# Patient Record
Sex: Female | Born: 1950 | Race: Black or African American | Hispanic: No | Marital: Married | Smoking: Never smoker
Health system: Southern US, Community
[De-identification: ages and names within clinical notes are randomized; demographics above are authoritative.]

## PROBLEM LIST (undated history)

## (undated) DIAGNOSIS — M1990 Unspecified osteoarthritis, unspecified site: Secondary | ICD-10-CM

## (undated) DIAGNOSIS — F419 Anxiety disorder, unspecified: Secondary | ICD-10-CM

## (undated) DIAGNOSIS — N390 Urinary tract infection, site not specified: Secondary | ICD-10-CM

## (undated) DIAGNOSIS — J069 Acute upper respiratory infection, unspecified: Secondary | ICD-10-CM

## (undated) DIAGNOSIS — I10 Essential (primary) hypertension: Secondary | ICD-10-CM

## (undated) DIAGNOSIS — E669 Obesity, unspecified: Secondary | ICD-10-CM

## (undated) DIAGNOSIS — D649 Anemia, unspecified: Secondary | ICD-10-CM

## (undated) DIAGNOSIS — R51 Headache: Secondary | ICD-10-CM

## (undated) HISTORY — DX: Anxiety disorder, unspecified: F41.9

## (undated) HISTORY — DX: Essential (primary) hypertension: I10

## (undated) HISTORY — DX: Obesity, unspecified: E66.9

## (undated) HISTORY — DX: Urinary tract infection, site not specified: N39.0

## (undated) HISTORY — PX: COLONOSCOPY: SHX174

## (undated) HISTORY — DX: Anemia, unspecified: D64.9

## (undated) HISTORY — PX: OTHER SURGICAL HISTORY: SHX169

## (undated) HISTORY — DX: Headache: R51

## (undated) HISTORY — DX: Acute upper respiratory infection, unspecified: J06.9

## (undated) HISTORY — DX: Unspecified osteoarthritis, unspecified site: M19.90

---

## 1998-04-04 ENCOUNTER — Other Ambulatory Visit: Admission: RE | Admit: 1998-04-04 | Discharge: 1998-04-04 | Payer: Self-pay | Admitting: Obstetrics and Gynecology

## 1999-04-10 ENCOUNTER — Other Ambulatory Visit: Admission: RE | Admit: 1999-04-10 | Discharge: 1999-04-10 | Payer: Self-pay | Admitting: Obstetrics and Gynecology

## 2000-04-22 ENCOUNTER — Other Ambulatory Visit: Admission: RE | Admit: 2000-04-22 | Discharge: 2000-04-22 | Payer: Self-pay | Admitting: Obstetrics and Gynecology

## 2000-05-16 ENCOUNTER — Encounter: Payer: Self-pay | Admitting: Obstetrics and Gynecology

## 2000-05-16 ENCOUNTER — Encounter: Admission: RE | Admit: 2000-05-16 | Discharge: 2000-05-16 | Payer: Self-pay | Admitting: Obstetrics and Gynecology

## 2001-04-28 ENCOUNTER — Other Ambulatory Visit: Admission: RE | Admit: 2001-04-28 | Discharge: 2001-04-28 | Payer: Self-pay | Admitting: Obstetrics and Gynecology

## 2001-05-17 ENCOUNTER — Encounter: Payer: Self-pay | Admitting: Obstetrics and Gynecology

## 2001-05-17 ENCOUNTER — Encounter: Admission: RE | Admit: 2001-05-17 | Discharge: 2001-05-17 | Payer: Self-pay | Admitting: Obstetrics and Gynecology

## 2002-05-22 ENCOUNTER — Encounter: Admission: RE | Admit: 2002-05-22 | Discharge: 2002-05-22 | Payer: Self-pay | Admitting: Obstetrics and Gynecology

## 2002-05-22 ENCOUNTER — Encounter: Payer: Self-pay | Admitting: Obstetrics and Gynecology

## 2002-11-20 ENCOUNTER — Ambulatory Visit (HOSPITAL_COMMUNITY): Admission: RE | Admit: 2002-11-20 | Discharge: 2002-11-20 | Payer: Self-pay | Admitting: Pulmonary Disease

## 2002-11-20 ENCOUNTER — Encounter: Payer: Self-pay | Admitting: Pulmonary Disease

## 2003-05-28 ENCOUNTER — Encounter: Payer: Self-pay | Admitting: Obstetrics and Gynecology

## 2003-05-28 ENCOUNTER — Encounter: Admission: RE | Admit: 2003-05-28 | Discharge: 2003-05-28 | Payer: Self-pay | Admitting: Obstetrics and Gynecology

## 2004-06-12 ENCOUNTER — Encounter: Admission: RE | Admit: 2004-06-12 | Discharge: 2004-06-12 | Payer: Self-pay | Admitting: Obstetrics and Gynecology

## 2004-11-30 ENCOUNTER — Ambulatory Visit: Payer: Self-pay | Admitting: Pulmonary Disease

## 2005-01-11 ENCOUNTER — Ambulatory Visit: Payer: Self-pay | Admitting: Pulmonary Disease

## 2005-06-14 ENCOUNTER — Encounter: Admission: RE | Admit: 2005-06-14 | Discharge: 2005-06-14 | Payer: Self-pay | Admitting: Obstetrics and Gynecology

## 2005-07-05 ENCOUNTER — Ambulatory Visit: Payer: Self-pay | Admitting: Pulmonary Disease

## 2006-01-12 ENCOUNTER — Ambulatory Visit: Payer: Self-pay | Admitting: Pulmonary Disease

## 2006-02-25 ENCOUNTER — Ambulatory Visit: Payer: Self-pay | Admitting: Internal Medicine

## 2006-03-07 ENCOUNTER — Ambulatory Visit: Payer: Self-pay | Admitting: Internal Medicine

## 2006-06-16 ENCOUNTER — Encounter: Admission: RE | Admit: 2006-06-16 | Discharge: 2006-06-16 | Payer: Self-pay | Admitting: Obstetrics and Gynecology

## 2006-07-13 ENCOUNTER — Ambulatory Visit: Payer: Self-pay | Admitting: Pulmonary Disease

## 2006-12-29 ENCOUNTER — Ambulatory Visit: Payer: Self-pay | Admitting: Pulmonary Disease

## 2007-06-26 ENCOUNTER — Ambulatory Visit: Payer: Self-pay | Admitting: Pulmonary Disease

## 2007-06-26 LAB — CONVERTED CEMR LAB
ALT: 20 units/L (ref 0–35)
Albumin: 4 g/dL (ref 3.5–5.2)
Alkaline Phosphatase: 40 units/L (ref 39–117)
Basophils Absolute: 0 10*3/uL (ref 0.0–0.1)
Basophils Relative: 0 % (ref 0.0–1.0)
Bilirubin, Direct: 0.1 mg/dL (ref 0.0–0.3)
CO2: 31 meq/L (ref 19–32)
Calcium: 9.1 mg/dL (ref 8.4–10.5)
Chloride: 109 meq/L (ref 96–112)
Cholesterol: 145 mg/dL (ref 0–200)
Creatinine, Ser: 0.7 mg/dL (ref 0.4–1.2)
Eosinophils Absolute: 0 10*3/uL (ref 0.0–0.6)
GFR calc Af Amer: 112 mL/min
GFR calc non Af Amer: 92 mL/min
Glucose, Bld: 93 mg/dL (ref 70–99)
HCT: 34.9 % — ABNORMAL LOW (ref 36.0–46.0)
Iron: 47 ug/dL (ref 42–145)
MCHC: 33.2 g/dL (ref 30.0–36.0)
MCV: 91 fL (ref 78.0–100.0)
Monocytes Relative: 4.8 % (ref 3.0–11.0)
Potassium: 3.8 meq/L (ref 3.5–5.1)
Sodium: 143 meq/L (ref 135–145)
Total Protein: 7.1 g/dL (ref 6.0–8.3)
Triglycerides: 28 mg/dL (ref 0–149)
VLDL: 6 mg/dL (ref 0–40)
WBC: 7.1 10*3/uL (ref 4.5–10.5)

## 2007-08-30 ENCOUNTER — Ambulatory Visit: Payer: Self-pay | Admitting: Pulmonary Disease

## 2008-09-03 DIAGNOSIS — I10 Essential (primary) hypertension: Secondary | ICD-10-CM

## 2008-09-03 DIAGNOSIS — R51 Headache: Secondary | ICD-10-CM

## 2008-09-03 DIAGNOSIS — F411 Generalized anxiety disorder: Secondary | ICD-10-CM | POA: Insufficient documentation

## 2008-09-03 DIAGNOSIS — R519 Headache, unspecified: Secondary | ICD-10-CM | POA: Insufficient documentation

## 2008-09-03 DIAGNOSIS — M1710 Unilateral primary osteoarthritis, unspecified knee: Secondary | ICD-10-CM

## 2008-10-25 ENCOUNTER — Ambulatory Visit: Payer: Self-pay | Admitting: Pulmonary Disease

## 2008-11-01 ENCOUNTER — Ambulatory Visit: Payer: Self-pay | Admitting: Pulmonary Disease

## 2008-11-01 DIAGNOSIS — D649 Anemia, unspecified: Secondary | ICD-10-CM

## 2008-11-01 DIAGNOSIS — J309 Allergic rhinitis, unspecified: Secondary | ICD-10-CM | POA: Insufficient documentation

## 2008-11-01 DIAGNOSIS — N390 Urinary tract infection, site not specified: Secondary | ICD-10-CM | POA: Insufficient documentation

## 2008-11-01 DIAGNOSIS — J069 Acute upper respiratory infection, unspecified: Secondary | ICD-10-CM | POA: Insufficient documentation

## 2008-11-10 LAB — CONVERTED CEMR LAB
BUN: 7 mg/dL (ref 6–23)
Basophils Absolute: 0 10*3/uL (ref 0.0–0.1)
Basophils Relative: 0.1 % (ref 0.0–3.0)
Bilirubin, Direct: 0.1 mg/dL (ref 0.0–0.3)
CO2: 30 meq/L (ref 19–32)
Calcium: 9.3 mg/dL (ref 8.4–10.5)
Chloride: 104 meq/L (ref 96–112)
Creatinine, Ser: 0.7 mg/dL (ref 0.4–1.2)
Eosinophils Absolute: 0 10*3/uL (ref 0.0–0.7)
GFR calc Af Amer: 111 mL/min
Glucose, Bld: 87 mg/dL (ref 70–99)
Hemoglobin: 12.4 g/dL (ref 12.0–15.0)
LDL Cholesterol: 80 mg/dL (ref 0–99)
MCHC: 33.8 g/dL (ref 30.0–36.0)
Neutro Abs: 3.9 10*3/uL (ref 1.4–7.7)
RBC: 4 M/uL (ref 3.87–5.11)
Sodium: 139 meq/L (ref 135–145)
TSH: 1.12 microintl units/mL (ref 0.35–5.50)
Total Bilirubin: 0.7 mg/dL (ref 0.3–1.2)
VLDL: 6 mg/dL (ref 0–40)
WBC: 6.7 10*3/uL (ref 4.5–10.5)

## 2009-10-31 ENCOUNTER — Ambulatory Visit: Payer: Self-pay | Admitting: Pulmonary Disease

## 2009-11-02 LAB — CONVERTED CEMR LAB
ALT: 15 units/L (ref 0–35)
Albumin: 4.4 g/dL (ref 3.5–5.2)
Alkaline Phosphatase: 43 units/L (ref 39–117)
Basophils Absolute: 0 10*3/uL (ref 0.0–0.1)
Bilirubin, Direct: <0.1 mg/dL (ref 0.0–0.3)
CO2: 19 meq/L (ref 19–32)
Cholesterol: 139 mg/dL (ref 0–200)
Creatinine, Ser: 0.74 mg/dL (ref 0.40–1.20)
Eosinophils Relative: 0 % (ref 0.0–5.0)
Glucose, Bld: 80 mg/dL (ref 70–99)
HCT: 37.2 % (ref 36.0–46.0)
Hemoglobin: 12 g/dL (ref 12.0–15.0)
LDL Cholesterol: 73 mg/dL (ref 0–99)
Lymphocytes Relative: 36.2 % (ref 12.0–46.0)
MCHC: 32.3 g/dL (ref 30.0–36.0)
MCV: 94.4 fL (ref 78.0–100.0)
Potassium: 4.1 meq/L (ref 3.5–5.3)
RBC: 3.94 M/uL (ref 3.87–5.11)
RDW: 12.5 % (ref 11.5–14.6)
Total Bilirubin: 0.5 mg/dL (ref 0.3–1.2)
Total CHOL/HDL Ratio: 2.4
Triglycerides: 45 mg/dL (ref <150)

## 2010-10-30 ENCOUNTER — Ambulatory Visit
Admission: RE | Admit: 2010-10-30 | Discharge: 2010-10-30 | Payer: Self-pay | Source: Home / Self Care | Attending: Pulmonary Disease | Admitting: Pulmonary Disease

## 2010-10-30 ENCOUNTER — Other Ambulatory Visit: Payer: Self-pay | Admitting: Pulmonary Disease

## 2010-10-30 ENCOUNTER — Encounter: Payer: Self-pay | Admitting: Pulmonary Disease

## 2010-10-30 LAB — URINALYSIS, ROUTINE W REFLEX MICROSCOPIC
Bilirubin Urine: NEGATIVE
Hemoglobin, Urine: NEGATIVE
Ketones, ur: NEGATIVE
Leukocytes, UA: NEGATIVE
Nitrite: NEGATIVE
Specific Gravity, Urine: 1.01 (ref 1.000–1.030)
Total Protein, Urine: NEGATIVE
Urine Glucose: NEGATIVE
Urobilinogen, UA: 0.2 (ref 0.0–1.0)
pH: 7.5 (ref 5.0–8.0)

## 2010-10-30 LAB — CBC WITH DIFFERENTIAL/PLATELET
Basophils Absolute: 0 10*3/uL (ref 0.0–0.1)
Basophils Relative: 0.1 % (ref 0.0–3.0)
Eosinophils Absolute: 0 10*3/uL (ref 0.0–0.7)
Eosinophils Relative: 0 % (ref 0.0–5.0)
HCT: 37 % (ref 36.0–46.0)
Hemoglobin: 12.4 g/dL (ref 12.0–15.0)
Lymphocytes Relative: 41.6 % (ref 12.0–46.0)
Lymphs Abs: 2.6 10*3/uL (ref 0.7–4.0)
MCHC: 33.3 g/dL (ref 30.0–36.0)
MCV: 92.4 fl (ref 78.0–100.0)
Monocytes Absolute: 0.4 10*3/uL (ref 0.1–1.0)
Monocytes Relative: 5.8 % (ref 3.0–12.0)
Neutro Abs: 3.3 10*3/uL (ref 1.4–7.7)
Neutrophils Relative %: 52.5 % (ref 43.0–77.0)
Platelets: 238 10*3/uL (ref 150.0–400.0)
RBC: 4.01 Mil/uL (ref 3.87–5.11)
RDW: 12.7 % (ref 11.5–14.6)
WBC: 6.3 10*3/uL (ref 4.5–10.5)

## 2010-10-30 LAB — BASIC METABOLIC PANEL
BUN: 9 mg/dL (ref 6–23)
CO2: 31 mEq/L (ref 19–32)
Calcium: 9.7 mg/dL (ref 8.4–10.5)
Chloride: 103 mEq/L (ref 96–112)
Creatinine, Ser: 0.7 mg/dL (ref 0.4–1.2)
GFR: 106.54 mL/min (ref >60.00)
Glucose, Bld: 82 mg/dL (ref 70–99)
Potassium: 4.1 mEq/L (ref 3.5–5.1)
Sodium: 140 mEq/L (ref 135–145)

## 2010-10-30 LAB — LIPID PANEL
Cholesterol: 167 mg/dL (ref 0–200)
HDL: 71.6 mg/dL (ref >39.00)
LDL Cholesterol: 88 mg/dL (ref 0–99)
Total CHOL/HDL Ratio: 2
Triglycerides: 39 mg/dL (ref 0.0–149.0)
VLDL: 7.8 mg/dL (ref 0.0–40.0)

## 2010-10-30 LAB — HEPATIC FUNCTION PANEL
ALT: 17 U/L (ref 0–35)
AST: 20 U/L (ref 0–37)
Albumin: 4.4 g/dL (ref 3.5–5.2)
Alkaline Phosphatase: 54 U/L (ref 39–117)
Bilirubin, Direct: 0.1 mg/dL (ref 0.0–0.3)
Total Bilirubin: 0.8 mg/dL (ref 0.3–1.2)
Total Protein: 7.5 g/dL (ref 6.0–8.3)

## 2010-10-30 LAB — TSH: TSH: 1.21 u[IU]/mL (ref 0.35–5.50)

## 2010-11-13 ENCOUNTER — Telehealth: Payer: Self-pay | Admitting: Pulmonary Disease

## 2010-11-19 ENCOUNTER — Telehealth (INDEPENDENT_AMBULATORY_CARE_PROVIDER_SITE_OTHER): Payer: Self-pay | Admitting: *Deleted

## 2010-11-19 NOTE — Progress Notes (Signed)
Summary: knee pads  Phone Note Call from Patient Call back at Home Phone [phone]   Caller: Patient Call For: nadel Summary of Call: pt wants a rx for velro knee pads. she saw dr Kriste Basque 1/13 and he mentioned these to her. she wants rx mailed to her home. (pt can use her flex spending acct if she has a rx). ok to leave msg on her phone per pt Initial call taken by: Tivis Ringer, CNA,  November 13, 2010 3:12 PM  Follow-up for Phone Call        Per last ov note, SN mentioned her using knee brace for exercise program.  Pls advise if okay to mail her rx for this thanks Follow-up by: Vernie Murders,  November 13, 2010 4:24 PM  Additional Follow-up for Phone Call Additional follow up Details #1::        per SN---ok for rx for the brace---this has been placed in the mail for the pt---lmom to make pt aware Randell Loop [facility]  November 13, 2010 4:54 PM

## 2010-11-19 NOTE — Assessment & Plan Note (Signed)
Summary: rov//mbw   CC:  Yearly ROV....  History of Present Illness: 60 y/o BF here for a follow up visit...    ~  Jan10:  she has HBP controlled on Lotrel and would like refill perscription...  she was seen recently by TParrett for sinusitis & Rx w/ Augmentin, Mucinex, & Saline nasal spray- now improved...   ~  October 31, 2009:  she's had a good year overall- CC= arthritis esp knees & using Aleve 2/d... BP controlled on Lotrel Rx, no new complaints or concerns, had 2010 Flu vaccine 12/10...    Current Problems:   PHYSICAL EXAMINATION (ICD-V70.0) - her GYN is DrHorvath and she is due for her annual PAP, on [facility]... they do her Mammograms there as well... she has not yet had a BMD... routine colonoscopy done 5/07 by DrPerry and was WNL- f/u planned 9yrs... her routine FLP has been normal on diet alone.  ALLERGIC RHINITIS (ICD-477.9) - she had allergy eval from DrESL in the past- dust & mold sensitive w/ trial of immunotherapy in the 90's...  Hx of UPPER RESPIRATORY INFECTION (ICD-465.9) - she has a hx of persitant/ recurrent sinus infections, congestion, drainage, etc... uses MUCNEX Prn.  HYPERTENSION (ICD-401.9) - controlled on LOTREL 5-10 daily... BP= 122/82 and similar at home, takes med regularly & tolerates well... denies HA, fatigue, visual changes, CP, palipit, dizziness, syncope, dyspnea, edema, etc...  Hx of UTI (ICD-599.0)  DEGENERATIVE JOINT DISEASE (ICD-715.90) - she uses ALEVE 2/d w/ good response... she has seen DrMortenson for  knee pain and had an MRI- she indicates that she may need TKR... she also had left shoulder pain w/ eval from DrNorris 2/07- he diagnosed rotator cuff impingement & gave her a shot.  ~  1/11:  we discussed adding Glucosamine/ Chondroitin; also takes FishOil, calcium, MVI, Vit D 1000/d.  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  ~   labs 1/10 showed Hg= 12.4, MCV= 92  ~  labs 1/11 showed Hg= 12.0, MCV= 94    Allergies: 1)  ! Sulfa  Comments:  Nurse/Medical Assistant: The patient's medications and allergies were reviewed with the patient and were updated in the Medication and Allergy Lists.  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 Mother died in her 75's from ?heart disease, MI? 3 Siblings: 2 Brothers- one w/ lupus, one w/ prostate problems.. 1 Sister- alive & well  Social History: Married 2 Children & one grandchild Never smoked Social alcohol retired  Review of Systems      See HPI       The patient complains of difficulty walking.  The patient denies anorexia, fever, weight loss, weight gain, vision loss, decreased hearing, hoarseness, chest pain, syncope, dyspnea on exertion, peripheral edema, prolonged cough, headaches, hemoptysis, abdominal pain, melena, hematochezia, severe indigestion/heartburn, hematuria, incontinence, muscle weakness, suspicious skin lesions, transient blindness, depression, unusual weight change, abnormal bleeding, enlarged lymph nodes, and angioedema.    Vital Signs:  Patient profile:   60 year old female Height:      64 inches Weight:      180 pounds BMI:     31.01 O2 Sat:      100 %  on Room air Temp:     98.7 degrees F oral Pulse rate:   82 / minute BP sitting:   122 / 82  (right arm) Cuff size:   regular  Vitals Entered By: Randell Loop CMA (October 31, 2009 12:02 PM)  O2 Sat at Rest %:  100 O2 Flow:  Room air CC: Yearly ROV... Is Patient Diabetic? No Pain Assessment Patient in pain? no      Comments meds updated today   Physical Exam  Additional Exam:   WD, WN, 60 y/o BF in NAD... GENERAL:  Alert & oriented; pleasant & cooperative... HEENT:  [place]/AT, EOM-wnl, PERRLA, EACs-clear, TMs-wnl, NOSE-clear, THROAT-clear & wnl. NECK:  Supple w/ fairROM; no JVD; normal carotid impulses w/o bruits; no thyromegaly or nodules palpated; no lymphadenopathy. CHEST:  Clear to P & A; without wheezes/ rales/ or rhonchi. HEART:  Regular Rhythm; without murmurs/ rubs/ or gallops. ABDOMEN:  Soft & nontender; normal bowel sounds; no organomegaly or masses detected. EXT: without deformities, mod arthritic changes; no varicose veins/ venous insuffic/ or edema. NEURO:  CN's intact; motor testing normal; sensory testing normal; gait normal & balance OK. DERM:  No lesions noted; no rash etc...     MISC. Report  Procedure date:  10/31/2009  Findings:      LABS REVIEWED>>> All WNL...  SN    Impression & Recommendations:  Problem # 1:  Hx of UPPER RESPIRATORY INFECTION (ICD-465.9) Stable-  no recent infections, continue the [facility].Marland KitchenMarland Kitchen Her updated medication list for this problem includes:    Mucinex 600 Mg Xr12h-tab (Guaifenesin) .[name]... Take 1-2 tabs by mouth two times a day w/ plenty of fluids...  Problem # 2:  HYPERTENSION (ICD-401.9) Controlled on med-  continue same + diet/ exercise, get weight down... Her updated medication list for this problem includes:    Amlodipine Besy-benazepril Hcl 5-10 Mg Caps (Amlodipine besy-benazepril hcl) .[name]... Take 1 tablet by mouth once a day  Orders: Venipuncture (60737) TLB-Lipid Panel (80061-LIPID) TLB-CBC Platelet - w/Differential (85025-CBCD) TLB-TSH (Thyroid Stimulating Hormone) (84443-TSH) T- * Misc. Laboratory test [phone])  Problem # 3:  DEGENERATIVE JOINT DISEASE (ICD-715.90) This is her CC-  knees esp... Aleve works for her, she doesn't want Ortho/ TKR yet, try Glucosamine etc...  Problem # 4:  HEADACHE (ICD-784.0) No recent problems...  Problem # 5:  ANXIETY (ICD-300.00) She is doing satis w/o  meds...  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   Complete Medication List: 1)  Mucinex 600 Mg Xr12h-tab (Guaifenesin) .... Take 1-2 tabs by mouth two times a day w/ plenty of fluids.Marland KitchenMarland Kitchen 2)  Amlodipine Besy-benazepril Hcl 5-10 Mg Caps (Amlodipine besy-benazepril hcl) .... Take 1 tablet by mouth once a day 3)  Fish Oil Oil (Fish oil) .... 2 caps daily 4)  Activella 1-0.5 Mg Tabs (Estradiol-norethindrone acet) .... Take 1 tablet by mouth once a day 5)  Calcium 600 1500 Mg Tabs (Calcium carbonate) .... Take 1 tablet by mouth once a day 6)  Multivitamins Tabs (Multiple vitamin) .... Take 1 tablet by mouth once a day 7)  Vitamin D 1000 Unit Tabs (Cholecalciferol) .... Take 1 cap by mouth once daily.Marland KitchenMarland Kitchen 8)  Glucosamine-chondroitin Caps (Glucosamine-chondroit-vit c-mn) .... Take 1 cap two times a day for joints...  Other Orders: Prescription Created Electronically [phone])  Patient Instructions: 1)  Today we updated your med list- see below.... 2)  We refilled your LOTREL for 2011... 3)  For your bone & joints: try the GLUCOSAMINE & CHONDROITIN one  cap twice daily... and Vit D 1000 u daily.Marland KitchenMarland Kitchen 4)  Today we did your follow up FASTING blood work...  please call the "phone tree" in a few days for your lab results.Marland KitchenMarland Kitchen 5)  Call for any problems.Marland KitchenMarland Kitchen 6)  Please schedule a follow-up appointment in 1 year, sooner as needed... Prescriptions: AMLODIPINE BESY-BENAZEPRIL HCL 5-10 MG CAPS (AMLODIPINE BESY-BENAZEPRIL HCL) Take 1 tablet by mouth once a day  #30 x prn   Entered and Authorized by:   Michele Mcalpine MD   Signed by:   Michele Mcalpine MD on 10/31/2009   Method used:   Print then Give to Patient   RxID:   [id number]

## 2010-11-19 NOTE — Assessment & Plan Note (Signed)
Summary: 1 yr rov ///kp   CC:  Yearly ROV & CPX....  History of Present Illness: 60 y/o BF here for a follow up visit...    ~  Jan10:  she has HBP controlled on Lotrel and would like refill perscription...  she was seen recently by TP for sinusitis & Rx w/ Augmentin, Mucinex, & Saline nasal spray- now improved...   ~  October 31, 2009:  she's had a good year overall- CC= arthritis esp knees & using Aleve 2/d... BP controlled on Lotrel Rx, no new complaints or concerns, had 2010 Flu vaccine 12/10...   ~  October 30, 2010:  generally stable w/ mod arthritic complaints in knees (using Aleve & doesn't want stronger pain med)- advised knee brace for exercise program... BP controleed on med;  Lipids have been adeq on diet;  we discussed checking CXR, EKG (NSR, WNL), blood work today...    Current Problems:   ALLERGIC RHINITIS (ICD-477.9) - she had allergy eval from DrESL in the past- dust & mold sensitive w/ trial of immunotherapy in the 90's...  Hx of UPPER RESPIRATORY INFECTION (ICD-465.9) - she has a hx of persitant/ recurrent sinus infections, congestion, drainage, etc... uses MUCNEX w/ relief.  HYPERTENSION (ICD-401.9) - controlled on LOTREL 5-10 daily... BP= 120/80 and similar at home, takes med regularly & tolerates well... denies HA, fatigue, visual changes, CP, palipit, dizziness, syncope, dyspnea, edema, etc...  ~  CXR 1/12 showed clear lungs, mild tortuosity of thorAo, NAD...  ~  EKG 1/12 showed NSR, essent WNL.Marland KitchenMarland Kitchen  Hx of UTI (ICD-599.0) - no recent symptoms.  DEGENERATIVE JOINT DISEASE (ICD-715.90) - she uses ALEVE 2/d w/ good response... she has seen DrMortenson for  knee pain and had an MRI- she indicates that she may need TKR... she also had left shoulder pain w/ eval from DrNorris 2/07- he diagnosed rotator cuff impingement & gave her a shot.  ~  1/11:  we discussed adding Glucosamine/ Chondroitin; also takes FishOil, calcium, MVI, Vit D 1000/d.  ~  1/12:  we discussed adding  knee brace during exercise program...  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  ~  labs 9/08 showed Hg= 11.6, MCV= 91, WBC/ Plat are norm, Fe= 47/ sat= 16%...  ~  labs 1/10 showed Hg= 12.4, MCV= 92  ~  labs 1/11 showed Hg= 12.0, MCV= 94  ~  labs 1/12 showed  Hg= 12.4, MCV= 92  PHYSICAL EXAMINATION (ICD-V70.0) - her routine FLP has been normal on diet alone.  ~  GI:   routine colonoscopy done 5/07 by DrPerry and was WNL- f/u planned 38yrs.  ~  GYN:  her GYN is DrHorvath> due for her annual PAP, & Mammograms, etc... she has not yet had a BMD.  ~  Immunizations:  she gets the yearly Flu vaccine each autumn...   Preventive Screening-Counseling & Management  Alcohol-Tobacco     Smoking Status: never  Allergies: 1)  ! Sulfa  Comments:  Nurse/Medical Assistant: The patient's medications and allergies were reviewed with the patient and were updated in the Medication and Allergy Lists.  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 Father died in his 48's from heart disease, MI... Mother died in her 80's from ?heart disease, MI?  3 Siblings: 2 Brothers- one w/ lupus, one w/ prostate problems.. 1 Sister- alive & well  Social History: Reviewed history from 10/31/2009 and no changes required. Married 2 Children & one grandchild Never smoked Social alcohol retired  Review of Systems       The patient complains of nasal congestion, dyspnea on exertion, constipation, joint pain, joint swelling, muscle weakness, stiffness, arthritis, and difficulty walking.  The patient denies fever, chills, sweats, anorexia, fatigue,  weakness, malaise, weight loss, sleep disorder, blurring, diplopia, eye irritation, eye discharge, vision loss, eye pain, photophobia, earache, ear discharge, tinnitus, decreased hearing, nosebleeds, sore throat, hoarseness, chest pain, palpitations, syncope, orthopnea, PND, peripheral edema, cough, dyspnea at rest, excessive sputum, hemoptysis, wheezing, pleurisy, nausea, vomiting, diarrhea, change in bowel habits, abdominal pain, melena, hematochezia, jaundice, gas/bloating, indigestion/heartburn, dysphagia, odynophagia, dysuria, hematuria, urinary frequency, urinary hesitancy, nocturia, incontinence, back pain, muscle cramps, sciatica, restless legs, leg pain at night, leg pain with exertion, rash, itching, dryness, suspicious lesions, paralysis, paresthesias, seizures, tremors, vertigo, transient blindness, frequent falls, frequent headaches, depression, anxiety, memory loss, confusion, cold intolerance, heat intolerance, polydipsia, polyphagia, polyuria, unusual weight change, abnormal bruising, bleeding, enlarged lymph nodes, urticaria, allergic rash, hay fever, and recurrent infections.    Vital Signs:  Patient profile:   60 year old female Height:      64 inches Weight:      180.50 pounds BMI:     31.09 O2 Sat:      98 % on Room air Temp:     97.9 degrees F oral Pulse rate:   80 / minute BP sitting:   120 / 80  (left arm) Cuff size:   regular  Vitals Entered By: Randell Loop CMA (October 30, 2010 11:44 AM)  O2 Sat at Rest %:  98 O2 Flow:  Room air CC: Yearly ROV & CPX... Is Patient Diabetic? No Pain Assessment Patient in pain? yes      Onset of pain  arthritis pain  Comments no changes in meds today   Physical Exam  Additional Exam:  WD, WN, 59 y/o BF in NAD... GENERAL:  Alert & oriented; pleasant & cooperative... HEENT:  [place]/AT, EOM-wnl, PERRLA, EACs-clear, TMs-wnl, NOSE-clear, THROAT-clear & wnl. NECK:  Supple w/ fairROM; no JVD; normal carotid impulses w/o bruits; no  thyromegaly or nodules palpated; no lymphadenopathy. CHEST:  Clear to P & A; without wheezes/ rales/ or rhonchi. HEART:  Regular Rhythm; without murmurs/ rubs/ or gallops. ABDOMEN:  Soft & nontender; normal bowel sounds; no organomegaly or masses detected. EXT: without deformities, mod arthritic changes; no varicose veins/ venous insuffic/ or edema. NEURO:  CN's intact; motor testing normal; sensory testing normal; gait normal & balance OK. DERM:  No lesions noted; no rash etc...    CXR  Procedure date:  10/30/2010  Findings:      CHEST - 2 VIEW Comparison: None   Findings: The cardiac silhouette, mediastinal and hilar contours are within normal limits.  There is mild tortuosity of the thoracic aorta.  The lungs are clear.  No pleural effusion.  The bony thorax is intact.   IMPRESSION: No acute cardiopulmonary findings.   Read By:  Cyndie Chime,  M.D.   EKG  Procedure date:  10/30/2010  Findings:      Normal sinus rhythm with rate of:  62/ min... Tracing is essent WNL, NAD...  SN   MISC. Report  Procedure date:  10/30/2010  Findings:      BMP (METABOL)   Sodium  140 mEq/L                   135-145   Potassium                 4.1 mEq/L                   3.5-5.1   Chloride                  103 mEq/L                   96-112   Carbon Dioxide            31 mEq/L                    19-32   Glucose                   82 mg/dL                    04-54   BUN                       9 mg/dL                     0-98   Creatinine                0.7 mg/dL                   1.1-9.1   Calcium                   9.7 mg/dL                   4.7-82.9   GFR                       106.54 mL/min               >60.00  Hepatic/Liver Function Panel (HEPATIC)   Total Bilirubin           0.8 mg/dL                   5.6-2.1   Direct Bilirubin          0.1 mg/dL                   3.0-8.6   Alkaline Phosphatase      54 U/L                      39-117   AST                        20 U/L                      0-37   ALT                       17 U/L                      0-35   Total Protein             7.5 g/dL                    5.7-8.4   Albumin  4.4 g/dL                    0.4-5.4  CBC Platelet w/Diff (CBCD)   White Cell Count          6.3 K/uL                    4.5-10.5   Red Cell Count            4.01 Mil/uL                 3.87-5.11   Hemoglobin                12.4 g/dL                   09.8-11.9   Hematocrit                37.0 %                      36.0-46.0   MCV                       92.4 fl                     78.0-100.0   Platelet Count            238.0 K/uL                  150.0-400.0   Neutrophil %              52.5 %                      43.0-77.0   Lymphocyte %              41.6 %                      12.0-46.0   Monocyte %                5.8 %                       3.0-12.0   Eosinophils%              0.0 %                       0.0-5.0   Basophils %               0.1 %                       0.0-3.0  Comments:      Lipid Panel (LIPID)   Cholesterol               167 mg/dL                   1-478   Triglycerides             39.0 mg/dL                  2.9-562.1   HDL                       30.86 mg/dL                 >57.84  LDL Cholesterol           88 mg/dL                    7-06   TSH (TSH)   FastTSH                   1.21 uIU/mL                 0.35-5.50   UDip w/Micro (URINE)   Color                     LT. YELLOW   Clarity                   CLEAR                       Clear   Specific Gravity          1.010                       1.000 - 1.030   Urine Ph                  7.5                         5.0-8.0   Protein                   NEGATIVE                    Negative   Urine Glucose             NEGATIVE                    Negative   Ketones                   NEGATIVE                    Negative   Urine Bilirubin           NEGATIVE                    Negative   Blood                     NEGATIVE                     Negative   Urobilinogen              0.2                         0.0 - 1.0   Leukocyte Esterace        NEGATIVE                    Negative   Nitrite                   NEGATIVE                    Negative   Urine WBC                 0-2/hpf                     0-2/hpf  Urine Epith               Rare(0-4/hpf)               Rare(0-4/hpf)   Urine Bacteria            Few(10-50/hpf)              None  Vitamin D (25-Hydroxy) (40981)  Vitamin D (25-Hydroxy)                             34 ng/mL                    30-89   Impression & Recommendations:  Problem # 1:  PHYSICAL EXAMINATION (ICD-V70.0)  Orders: T-2 View CXR (71020TC) 12 Lead EKG (12 Lead EKG) TLB-BMP (Basic Metabolic Panel-BMET) (80048-METABOL) TLB-Hepatic/Liver Function Pnl (80076-HEPATIC) TLB-CBC Platelet - w/Differential (85025-CBCD) TLB-Lipid Panel (80061-LIPID) TLB-TSH (Thyroid Stimulating Hormone) (84443-TSH) TLB-Udip w/ Micro (81001-URINE) T-Vitamin D (25-Hydroxy) (19147-82956)  Problem # 2:  ALLERGIC RHINITIS (ICD-477.9) We will write Rx for Mucinex so she can use her FlexCard...  Problem # 3:  HYPERTENSION (ICD-401.9) Controlled>  same med. Her updated medication list for this problem includes:    Amlodipine Besy-benazepril Hcl 5-10 Mg Caps (Amlodipine besy-benazepril hcl) .[name]... Take 1 tablet by mouth once a day  Problem # 4:  DEGENERATIVE JOINT DISEASE (ICD-715.90) Mod bilat knee arthritis... she uses OTC Aleve etc & doesn't want persription Rx just yet... we discussed Rx w/ Knee brace etc. Her updated medication list for this problem includes:    Aleve 220 Mg Tabs (Naproxen sodium) .[name]... As needed  Problem # 5:  ANXIETY (ICD-300.00) She is doing well & does not request anxiolytic Rx...  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  Problem # 7:  OTHER MEDICAL PROBLEMS AS NOTED>>>  Complete Medication List: 1)  Mucinex 600 Mg Xr12h-tab (Guaifenesin) .... Take 1-2 tabs by mouth  two times a day w/ plenty of fluids.Marland KitchenMarland Kitchen 2)  Amlodipine Besy-benazepril Hcl 5-10 Mg Caps (Amlodipine besy-benazepril hcl) .... Take 1 tablet by mouth once a day 3)  Fish Oil Oil (Fish oil) .... 2 caps daily 4)  Activella 1-0.5 Mg Tabs (Estradiol-norethindrone acet) .... Take 1 tablet by mouth once a day 5)  Aleve 220 Mg Tabs (Naproxen sodium) .... As needed 6)  Glucosamine-chondroitin Caps (Glucosamine-chondroit-vit c-mn) .... Take 1 cap two times a day for joints... 7)  Calcium 600 1500 Mg Tabs (Calcium carbonate) .... Take 1 tablet by mouth once a day 8)  Multivitamins Tabs (Multiple vitamin) .... Take 1 tablet by mouth once a day 9)  Vitamin D 1000 Unit Tabs (Cholecalciferol) .... Take 1 cap by mouth once daily...  Patient Instructions: 1)  Today we updated your med list- see below.... 2)  We refilled your persrciptions for 2012... 3)  Today we did your follow up CXR, EKG, & FASTING blood work... please call the "phone tree" in a few days for your lab results.Marland KitchenMarland Kitchen  4)  Try increasing your exercise program w/ the knee braces we discussed.Marland KitchenMarland Kitchen 5)  Call for any problems.Marland KitchenMarland Kitchen 6)  Please schedule a follow-up appointment in 1 year, sooner as needed. Prescriptions: AMLODIPINE BESY-BENAZEPRIL HCL 5-10 MG CAPS (AMLODIPINE BESY-BENAZEPRIL HCL) Take 1 tablet by mouth once a day  #90 x prn   Entered and Authorized by:   Michele Mcalpine MD   Signed by:   Michele Mcalpine  MD on 10/30/2010   Method used:   Print then Give to Patient   RxID:   [id number] MUCINEX 600 MG XR12H-TAB (GUAIFENESIN) take 1-2 tabs by mouth two times a day w/ plenty of fluids...  #120 x prn   Entered and Authorized by:   Michele Mcalpine MD   Signed by:   Michele Mcalpine MD on 10/30/2010   Method used:   Print then Give to Patient   RxID:   [id number]    Immunization History:  Influenza Immunization History:    Influenza:  historical (07/27/2010)

## 2010-11-25 NOTE — Progress Notes (Signed)
Summary: knee pads<<<contact [facility]  Phone Note Call from Patient Call back at [facility] Phone [phone]   Caller: Patient Call For: nadel Summary of Call: pt has the rx for knee pads from dr nadel. she took this to [facility] (or [facility]) and they told her she would have to go to a specialty store. she wants to know "where that would be". ok to leave the info on her home phone.  Initial call taken by: Tivis Ringer, CNA,  November 19, 2010 9:23 AM  Follow-up for Phone Call        Left detailed msg and told the pt to contact [facility] for the knee pads or brace. Told her to callback if she had any other questions.Michel Bickers CMA  November 19, 2010 9:57 AM

## 2010-11-30 ENCOUNTER — Telehealth: Payer: Self-pay | Admitting: Pulmonary Disease

## 2010-12-03 ENCOUNTER — Telehealth (INDEPENDENT_AMBULATORY_CARE_PROVIDER_SITE_OTHER): Payer: Self-pay | Admitting: *Deleted

## 2010-12-09 NOTE — Progress Notes (Signed)
Summary: referral  returning call  Phone Note Call from Patient Call back at Home Phone [phone]   Caller: Patient Call For: nadel Reason for Call: Referral Summary of Call: Pt states she needs a referral to a rheumatologist. Initial call taken by: Darletta Moll,  November 30, 2010 10:17 AM  Follow-up for Phone Call        [facility].  Has she seen a rheumatologist before?  Or Does she know who she wants to be referred to?Arman Filter LPN  November 30, 2010 10:56 AM   Pt returned call from triage. she says that she would like to see a dr. Jimmy Footman at the [facility] med. b[facility] on [address]. they need a referral. pt at work # [phone]. Tivis Ringer, CNA  December 01, 2010 9:34 AM  Additional Follow-up for Phone Call Additional follow up Details #1::        Pt is requesting a referral to Dr. Janetta Hora, rheumatologists for her arthritis in her knees. She states it is getting much worse and she is unable to do normal activities like she used to. She states she used to love to exercise but her knees hurt too much for this and she also states that getting up out of a chair has gotten much difficut as well. Please advise if ok to place referral. Pt wants to call and make appt herself. Carron Curie CMA  December 01, 2010 9:47 AM     Additional Follow-up for Phone Call Additional follow up Details #2::    not sure rhematology is the right referral  prev. note says seen by ortho and DJD in knees with need for TKR -would go back to ortho.  Follow-up by: Rubye Oaks NP,  December 01, 2010 4:54 PM  Additional Follow-up for Phone Call Additional follow up Details #3:: Details for Additional Follow-up Action Taken: lmomtcb Randell Loop CMA  December 01, 2010 4:58 PM   returning call.  call back #[phone] Lehman Prom  December 02, 2010 9:27 AM   called and spoke with pt and she stated that she would rather see Dr. Norina Buzzard just to be evaluated for her all over joint  pain---per SN---this is ok for [place] to make this appt for her---pt is requesting a late afternoon appt at 4 pm if possible and can call and leave message on pts home number for the appt date Randell Loop [facility]  December 02, 2010 3:57 PM
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
## 2011-01-11 ENCOUNTER — Telehealth: Payer: Self-pay | Admitting: Pulmonary Disease

## 2011-01-11 MED ORDER — PREDNISONE (PAK) 5 MG PO TABS: ORAL_TABLET | ORAL | Status: DC

## 2011-01-11 MED ORDER — AZITHROMYCIN 250 MG PO TABS: ORAL_TABLET | ORAL | Status: AC

## 2011-01-11 NOTE — Telephone Encounter (Signed)
Spoke with pt and advised of recommendations. Rx sent. Carron Curie, CMA

## 2011-01-11 NOTE — Telephone Encounter (Signed)
Per SN--ok for pt to have zpak #1  Take as directed, pred dosepak 5 mg   6 day pack and continue the mucinex .  thanks

## 2011-01-11 NOTE — Telephone Encounter (Signed)
Called and spoke with pt and she c/o cough w/ clear phlem, chest tightness, hoarseness, sinus drainage and feels it is going into her chest x Saturday. Pt is taking mucinex and it is not helping. Please advise recs for pt Dr. Kriste Basque. Thanks  Allergies  Allergen Reactions  . Sulfonamide Derivatives     REACTION: unsure of reaction---this was as a child     Carver Fila, CMA

## 2011-05-13 ENCOUNTER — Telehealth: Payer: Self-pay | Admitting: Pulmonary Disease

## 2011-05-13 NOTE — Telephone Encounter (Signed)
lmomtcb  

## 2011-05-14 NOTE — Telephone Encounter (Signed)
Pt calling again in reference to previous call can be reached at [phone].Paula Kelley

## 2011-05-14 NOTE — Telephone Encounter (Signed)
Called spoke with patient, who is requesting a rx for a MVI so that she and her husband can use flex spending to pay for it.  Pt is requesting a MVI that SN recommends though i informed patient that a centrum silver will work well.  Requesting to have this printed off and will pick up when ready.  Pt aware SN out of office this afternoon and is okay with Monday.  Will create phone note for pt's husband who is a patient as well.

## 2011-05-17 NOTE — Telephone Encounter (Signed)
Called and spoke with pts husband and he is aware of rx that have been placed in the mail today for the pts.

## 2011-05-18 ENCOUNTER — Other Ambulatory Visit: Payer: Self-pay | Admitting: Pulmonary Disease

## 2011-07-05 ENCOUNTER — Telehealth: Payer: Self-pay | Admitting: Pulmonary Disease

## 2011-07-05 MED ORDER — AMOXICILLIN-POT CLAVULANATE 875-125 MG PO TABS: 1.0000 | ORAL_TABLET | Freq: Two times a day (BID) | ORAL | Status: AC

## 2011-07-05 NOTE — Telephone Encounter (Signed)
Spoke with pt. She is c/o sinus pressure and PND x 4 days. States also has some dry cough. Taking mucinex without relief. Would like something called in. Please advise, thanks! Allergies  Allergen Reactions  . Sulfonamide Derivatives     REACTION: unsure of reaction---this was as a child

## 2011-07-05 NOTE — Telephone Encounter (Signed)
Per SN----ok for augmentin 875mg    #14   1 po bid  And cont the mucinex 2 po bid with plenty of fluids and nasal saline mist.  thanks

## 2011-07-05 NOTE — Telephone Encounter (Signed)
Spoke with pt and notified of recs per SN. She verbalized understanding and rx was sent to pharm.  

## 2011-10-29 ENCOUNTER — Ambulatory Visit: Payer: Self-pay | Admitting: Pulmonary Disease

## 2011-11-05 ENCOUNTER — Other Ambulatory Visit (INDEPENDENT_AMBULATORY_CARE_PROVIDER_SITE_OTHER): Payer: BC Managed Care – PPO

## 2011-11-05 ENCOUNTER — Ambulatory Visit (INDEPENDENT_AMBULATORY_CARE_PROVIDER_SITE_OTHER): Payer: BC Managed Care – PPO | Admitting: Pulmonary Disease

## 2011-11-05 ENCOUNTER — Encounter: Payer: Self-pay | Admitting: Pulmonary Disease

## 2011-11-05 VITALS — BP 124/76 | HR 102 | Temp 98.3°F | Ht 64.0 in | Wt 175.4 lb

## 2011-11-05 DIAGNOSIS — M1990 Unspecified osteoarthritis, unspecified site: Secondary | ICD-10-CM

## 2011-11-05 DIAGNOSIS — Z0000 Encounter for general adult medical examination without abnormal findings: Secondary | ICD-10-CM

## 2011-11-05 DIAGNOSIS — R51 Headache: Secondary | ICD-10-CM

## 2011-11-05 DIAGNOSIS — F411 Generalized anxiety disorder: Secondary | ICD-10-CM

## 2011-11-05 DIAGNOSIS — D649 Anemia, unspecified: Secondary | ICD-10-CM

## 2011-11-05 DIAGNOSIS — I10 Essential (primary) hypertension: Secondary | ICD-10-CM

## 2011-11-05 LAB — LIPID PANEL: Triglycerides: 58 mg/dL (ref 0.0–149.0)

## 2011-11-05 LAB — CBC WITH DIFFERENTIAL/PLATELET
Basophils Relative: 0.1 % (ref 0.0–3.0)
HCT: 37.3 % (ref 36.0–46.0)
Hemoglobin: 12.4 g/dL (ref 12.0–15.0)
MCHC: 33.1 g/dL (ref 30.0–36.0)
Neutro Abs: 2.9 10*3/uL (ref 1.4–7.7)
Neutrophils Relative %: 55 % (ref 43.0–77.0)
Platelets: 275 10*3/uL (ref 150.0–400.0)
RBC: 4.15 Mil/uL (ref 3.87–5.11)
RDW: 13.3 % (ref 11.5–14.6)

## 2011-11-05 LAB — HEPATIC FUNCTION PANEL
Albumin: 4.3 g/dL (ref 3.5–5.2)
Alkaline Phosphatase: 65 U/L (ref 39–117)
Total Bilirubin: 0.5 mg/dL (ref 0.3–1.2)
Total Protein: 7.5 g/dL (ref 6.0–8.3)

## 2011-11-05 LAB — BASIC METABOLIC PANEL
CO2: 30 mEq/L (ref 19–32)
Creatinine, Ser: 0.9 mg/dL (ref 0.4–1.2)
Glucose, Bld: 81 mg/dL (ref 70–99)
Sodium: 142 mEq/L (ref 135–145)

## 2011-11-05 MED ORDER — MELOXICAM 15 MG PO TABS: 15.0000 mg | ORAL_TABLET | Freq: Every day | ORAL | Status: DC

## 2011-11-05 MED ORDER — AMLODIPINE BESY-BENAZEPRIL HCL 5-10 MG PO CAPS: 1.0000 | ORAL_CAPSULE | Freq: Every day | ORAL | Status: DC

## 2011-11-05 NOTE — Patient Instructions (Signed)
Today we updated your med list in our EPIC system...    Continue your current medications the same...  Today we did your follow up fasting blood work...    Please call the PHONE TREE in a few days for your results...    Dial [id number] & when prompted enter your patient number followed by the # symbol...    Your patient number is:  [id number]  Call for any questions...  Let's plan another physical in 1 year's time.Marland KitchenMarland Kitchen

## 2011-11-05 NOTE — Progress Notes (Signed)
Subjective:     Patient ID: Paula Kelley, female   DOB: 19-Jan-1951, 61 y.o.   MRN: [id number]  HPI 61 y/o BF here for a follow up visit...   ~  October 31, 2009:  she's had a good year overall- CC= arthritis esp knees & using Aleve 2/d... BP controlled on Lotrel Rx, no new complaints or concerns, had 2010 Flu vaccine 12/10...  ~  October 30, 2010:  generally stable w/ mod arthritic complaints in knees (using Aleve & doesn't want stronger pain med)- advised knee brace for exercise program... BP controlled on med;  Lipids have been adeq on diet;  we discussed checking CXR, EKG, blood work today>> see below:  ~  November 05, 2011:  Yearly ROV & CPX> we had prev discussed Rheum eval for her arthritis but she never went preferring to use Aleve/Advil as needed for her knee complaints (as advised by her niece who is an Charity fundraiser);  We discussed trial MOBIC 15mg /d as needed vs Ortho/ Rheum evals...  See prob list below>>    >HBP> controlled on Lotrel 5-10 daily w/ BP= 124/76 & she is asymptomatic w/o CP, palpit, dizzy, syncope, SOB, edema, etc...    >She continues to take numerous supplements: Calcium, Vit D, MVI, Fish Oil, Fe...          Problem List:   ALLERGIC RHINITIS (ICD-477.9) - she had allergy eval from DrESL in the past- dust & mold sensitive w/ trial of immunotherapy in the 90's...  Hx of UPPER RESPIRATORY INFECTION (ICD-465.9) - she has a hx of persitant/ recurrent sinus infections, congestion, drainage, etc... uses MUCNEX w/ relief.  HYPERTENSION (ICD-401.9) - controlled on LOTREL 5-10 daily...  ~  1/12: BP= 120/80 and similar at home, takes med regularly & tolerates well; denies HA, fatigue, visual changes, CP, palipit, dizziness, syncope, dyspnea, edema, etc... ~  CXR 1/12 showed clear lungs, mild tortuosity of thorAo, NAD... ~  EKG 1/12 showed NSR, essent WNL... ~  1/13: BP= 124/76 & she remains asymptomatic  Hx of UTI (ICD-599.0) - no recent symptoms.  DEGENERATIVE JOINT DISEASE  (ICD-715.90) - she uses ALEVE 2/d w/ good response... she has seen DrMortenson for  knee pain and had an MRI- she indicates that she may need TKR... she also had left shoulder pain w/ eval from DrNorris 2/07- he diagnosed rotator cuff impingement & gave her a shot. ~  1/11:  we discussed adding Glucosamine/ Chondroitin; also takes FishOil, calcium, MVI, Vit D 1000/d. ~  1/12:  we discussed adding knee brace during exercise program... ~  1/13:  We wrote for [facility] 15mg /d as needed for arthritis pain...  Hx of HEADACHE (ICD-784.0) - she requests written Rx for Mucinex (so her FlexAcct will pay) to use as needed since it works for her in preventing HAs...  ANXIETY (ICD-300.00) - not on meds...  ANEMIA, MILD (ICD-285.9) - she take FeSO4 supplement. ~  labs 9/08 showed Hg= 11.6, MCV= 91, WBC/ Plat are norm, Fe= 47/ sat= 16%... ~  labs 1/10 showed Hg= 12.4, MCV= 92 ~  labs 1/11 showed Hg= 12.0, MCV= 94 ~  labs 1/12 showed  Hg= 12.4, MCV= 92 ~  Labs 1/13 showed Hg= 12.4, MCV= 90  PHYSICAL EXAMINATION (ICD-V70.0) - her routine FLP has been normal on diet alone. ~  GI:   routine colonoscopy done 5/07 by DrPerry and was WNL- f/u planned 15yrs. ~  GYN:  her GYN is DrHorvath> due for her annual PAP, & Mammograms, etc... she  has not yet had a BMD. ~  Immunizations:  she gets the yearly Flu vaccine each autumn...   No past surgical history on file.   Outpatient Encounter Prescriptions as of 11/05/2011  Medication Sig Dispense Refill  . amLODipine-benazepril (LOTREL) 5-10 MG per capsule Take 1 capsule by mouth daily.  30 capsule  11  . aspirin 81 MG tablet Take 81 mg by mouth daily.      . calcium carbonate (OS-CAL) 600 MG TABS Take 600 mg by mouth daily.      . cholecalciferol (VITAMIN D) 1000 UNITS tablet Take 1,000 Units by mouth daily.      . ferrous sulfate 325 (65 FE) MG tablet Take 325 mg by mouth daily with breakfast.      . fish oil-omega-3 fatty acids 1000 MG capsule Take 2 g by mouth daily.       [name] guaiFENesin (MUCINEX) 600 MG 12 hr tablet Take 1,200 mg by mouth 2 (two) times daily.      [name] ibuprofen (ADVIL,MOTRIN) 200 MG tablet Take 200 mg by mouth every 6 (six) hours as needed.      . Multiple Vitamins-Minerals (MULTIVITAMIN PO) Take 1 tablet by mouth daily.      . psyllium (METAMUCIL) 58.6 % powder Take 1 packet by mouth daily.      . meloxicam (MOBIC) 15 MG tablet Take 1 tablet (15 mg total) by mouth daily. As needed for arthritis pain  30 tablet  11    Allergies  Allergen Reactions  . Sulfonamide Derivatives     REACTION: unsure of reaction---this was as a child    Current Medications, Allergies, Past Medical History, Past Surgical History, Family History, and Social History were reviewed in [employer] record.    Review of Systems        The patient complains of nasal congestion, dyspnea on exertion, constipation, joint pain, joint swelling, muscle weakness, stiffness, arthritis, and difficulty walking.  The patient denies fever, chills, sweats, anorexia, fatigue, weakness, malaise, weight loss, sleep disorder, blurring, diplopia, eye irritation, eye discharge, vision loss, eye pain, photophobia, earache, ear discharge, tinnitus, decreased hearing, nosebleeds, sore throat, hoarseness, chest pain, palpitations, syncope, orthopnea, PND, peripheral edema, cough, dyspnea at rest, excessive sputum, hemoptysis, wheezing, pleurisy, nausea, vomiting, diarrhea, change in bowel habits, abdominal pain, melena, hematochezia, jaundice, gas/bloating, indigestion/heartburn, dysphagia, odynophagia, dysuria, hematuria, urinary frequency, urinary hesitancy, nocturia, incontinence, back pain, muscle cramps, sciatica, restless legs, leg pain at night, leg pain with exertion, rash, itching, dryness, suspicious lesions, paralysis, paresthesias, seizures, tremors, vertigo, transient blindness, frequent falls, frequent headaches, depression, anxiety, memory loss, confusion, cold  intolerance, heat intolerance, polydipsia, polyphagia, polyuria, unusual weight change, abnormal bruising, bleeding, enlarged lymph nodes, urticaria, allergic rash, hay fever, and recurrent infections.     Objective:   Physical Exam     WD, WN, 60 y/o BF in NAD... GENERAL:  Alert & oriented; pleasant & cooperative... HEENT:  [place]/AT, EOM-wnl, PERRLA, EACs-clear, TMs-wnl, NOSE-clear, THROAT-clear & wnl. NECK:  Supple w/ fairROM; no JVD; normal carotid impulses w/o bruits; no thyromegaly or nodules palpated; no lymphadenopathy. CHEST:  Clear to P & A; without wheezes/ rales/ or rhonchi. HEART:  Regular Rhythm; without murmurs/ rubs/ or gallops. ABDOMEN:  Soft & nontender; normal bowel sounds; no organomegaly or masses detected. EXT: without deformities, mod arthritic changes; no varicose veins/ venous insuffic/ or edema. NEURO:  CN's intact; motor testing normal; sensory testing normal; gait normal & balance OK. DERM:  No lesions noted; no  rash etc...  RADIOLOGY DATA:  Reviewed in the EPIC EMR & discussed w/ the patient...  LABORATORY DATA:  Reviewed in the EPIC EMR & discussed w/ the patient...    >>Reviewed her fasting blood work> all WNL, looks good...   Assessment:     CPX>>   AR, URIs>  She uses OTC meds to keep symptoms under control...  HBP>  Well controlled on Lotrel 5-10 daily; she knows to avoid salt & get weight down...  DJD>  This is her CC; we will try [facility] 15mg /d & she is encouraged to f/u w/ Orthopedist of her choice...  Hx Headaches>  None recently but she notes that Mucinex helps prevent them in her case...  Anxiety>  She manages very well & doesn't want anxiolytic Rx...  Anemia>  Hg chronically ~12, no bleeding 7 up to date on colon checks...     Plan:     Patient's Medications  New Prescriptions   MELOXICAM (MOBIC) 15 MG TABLET    Take 1 tablet (15 mg total) by mouth daily. As needed for arthritis pain  Previous Medications   ASPIRIN 81 MG TABLET    Take  81 mg by mouth daily.   CALCIUM CARBONATE (OS-CAL) 600 MG TABS    Take 600 mg by mouth daily.   CHOLECALCIFEROL (VITAMIN D) 1000 UNITS TABLET    Take 1,000 Units by mouth daily.   FERROUS SULFATE 325 (65 FE) MG TABLET    Take 325 mg by mouth daily with breakfast.   FISH OIL-OMEGA-3 FATTY ACIDS 1000 MG CAPSULE    Take 2 g by mouth daily.   GUAIFENESIN (MUCINEX) 600 MG 12 HR TABLET    Take 1,200 mg by mouth 2 (two) times daily.   IBUPROFEN (ADVIL,MOTRIN) 200 MG TABLET    Take 200 mg by mouth every 6 (six) hours as needed.   MULTIPLE VITAMINS-MINERALS (MULTIVITAMIN PO)    Take 1 tablet by mouth daily.   PSYLLIUM (METAMUCIL) 58.6 % POWDER    Take 1 packet by mouth daily.  Modified Medications   Modified Medication Previous Medication   AMLODIPINE-BENAZEPRIL (LOTREL) 5-10 MG PER CAPSULE amLODipine-benazepril (LOTREL) 5-10 MG per capsule      Take 1 capsule by mouth daily.    TAKE ONE CAPSULE BY MOUTH EVERY DAY  Discontinued Medications   ESTRADIOL-NORETHINDRONE (ACTIVELLA) 1-0.5 MG PER TABLET    Take 1 tablet by mouth daily.   GLUCOSAMINE-CHONDROITIN 500-400 MG TABLET    Take 1 tablet by mouth 2 (two) times daily.   NAPROXEN SODIUM (ANAPROX) 220 MG TABLET    Take 220 mg by mouth 2 (two) times daily with a meal.   PREDNISONE, PAK, (STERAPRED) 5 MG TABS    As directed 6 day pak.

## 2011-11-06 ENCOUNTER — Encounter: Payer: Self-pay | Admitting: Pulmonary Disease

## 2011-11-06 LAB — VITAMIN D 25 HYDROXY (VIT D DEFICIENCY, FRACTURES): Vit D, 25-Hydroxy: 47 ng/mL (ref 30–89)

## 2011-12-06 ENCOUNTER — Other Ambulatory Visit: Payer: Self-pay | Admitting: Pulmonary Disease

## 2012-01-15 ENCOUNTER — Other Ambulatory Visit: Payer: Self-pay | Admitting: Pulmonary Disease

## 2012-01-17 ENCOUNTER — Telehealth: Payer: Self-pay | Admitting: Pulmonary Disease

## 2012-01-17 NOTE — Telephone Encounter (Signed)
lmomtcb x1 

## 2012-01-18 NOTE — Telephone Encounter (Signed)
lmomtcb  

## 2012-01-19 NOTE — Telephone Encounter (Signed)
LMOMTCB x 1 

## 2012-01-20 ENCOUNTER — Telehealth: Payer: Self-pay

## 2012-01-20 MED ORDER — GUAIFENESIN ER 600 MG PO TB12: ORAL_TABLET | ORAL | Status: DC

## 2012-01-20 NOTE — Telephone Encounter (Signed)
LMOMTCB for pt. Told her to call to leave a new msg if still having questions regarding Mucinex.

## 2012-01-20 NOTE — Telephone Encounter (Signed)
lmomtcb  

## 2012-01-20 NOTE — Telephone Encounter (Signed)
Returning call can be reached at [phone].Raylene Everts

## 2012-01-20 NOTE — Telephone Encounter (Signed)
Spoke with pt. She states that she needs a new rx for mucinex, larger quantity so she can does not have to refill so often. She takes 1 to 2 tabs bid so I sent in # 120 for her. Pt states nothing further needed.

## 2012-01-23 ENCOUNTER — Other Ambulatory Visit: Payer: Self-pay | Admitting: Pulmonary Disease

## 2012-08-15 LAB — HM PAP SMEAR: HM Pap smear: NEGATIVE

## 2012-10-03 ENCOUNTER — Other Ambulatory Visit: Payer: Self-pay | Admitting: Pulmonary Disease

## 2012-10-03 MED ORDER — MELOXICAM 15 MG PO TABS: 15.0000 mg | ORAL_TABLET | Freq: Every day | ORAL | Status: DC

## 2012-10-03 MED ORDER — AMLODIPINE BESY-BENAZEPRIL HCL 5-10 MG PO CAPS: 1.0000 | ORAL_CAPSULE | Freq: Every day | ORAL | Status: DC

## 2012-10-03 NOTE — Telephone Encounter (Signed)
Refills sent to the pharmacy.

## 2012-11-03 ENCOUNTER — Other Ambulatory Visit (INDEPENDENT_AMBULATORY_CARE_PROVIDER_SITE_OTHER): Payer: BC Managed Care – PPO

## 2012-11-03 ENCOUNTER — Ambulatory Visit (INDEPENDENT_AMBULATORY_CARE_PROVIDER_SITE_OTHER): Payer: BC Managed Care – PPO | Admitting: Pulmonary Disease

## 2012-11-03 ENCOUNTER — Encounter: Payer: Self-pay | Admitting: Pulmonary Disease

## 2012-11-03 VITALS — BP 130/90 | HR 61 | Temp 97.6°F | Ht 64.0 in | Wt 182.4 lb

## 2012-11-03 DIAGNOSIS — Z0000 Encounter for general adult medical examination without abnormal findings: Secondary | ICD-10-CM

## 2012-11-03 DIAGNOSIS — I10 Essential (primary) hypertension: Secondary | ICD-10-CM

## 2012-11-03 DIAGNOSIS — J309 Allergic rhinitis, unspecified: Secondary | ICD-10-CM

## 2012-11-03 DIAGNOSIS — M1990 Unspecified osteoarthritis, unspecified site: Secondary | ICD-10-CM

## 2012-11-03 DIAGNOSIS — F411 Generalized anxiety disorder: Secondary | ICD-10-CM

## 2012-11-03 DIAGNOSIS — R51 Headache: Secondary | ICD-10-CM

## 2012-11-03 LAB — BASIC METABOLIC PANEL
BUN: 13 mg/dL (ref 6–23)
CO2: 30 mEq/L (ref 19–32)
Chloride: 100 mEq/L (ref 96–112)
Creatinine, Ser: 0.7 mg/dL (ref 0.4–1.2)
Glucose, Bld: 93 mg/dL (ref 70–99)
Potassium: 3.9 mEq/L (ref 3.5–5.1)

## 2012-11-03 LAB — CBC WITH DIFFERENTIAL/PLATELET
Basophils Relative: 0.1 % (ref 0.0–3.0)
Eosinophils Absolute: 0 10*3/uL (ref 0.0–0.7)
HCT: 38.8 % (ref 36.0–46.0)
Hemoglobin: 12.6 g/dL (ref 12.0–15.0)
Lymphocytes Relative: 38.5 % (ref 12.0–46.0)
MCHC: 32.4 g/dL (ref 30.0–36.0)
MCV: 88.9 fl (ref 78.0–100.0)
Neutro Abs: 3.1 10*3/uL (ref 1.4–7.7)
RBC: 4.36 Mil/uL (ref 3.87–5.11)

## 2012-11-03 LAB — LIPID PANEL
Cholesterol: 162 mg/dL (ref 0–200)
LDL Cholesterol: 85 mg/dL (ref 0–99)
Triglycerides: 59 mg/dL (ref 0.0–149.0)

## 2012-11-03 LAB — HEPATIC FUNCTION PANEL
ALT: 20 U/L (ref 0–35)
AST: 23 U/L (ref 0–37)
Albumin: 4.4 g/dL (ref 3.5–5.2)
Total Bilirubin: 0.7 mg/dL (ref 0.3–1.2)
Total Protein: 8 g/dL (ref 6.0–8.3)

## 2012-11-03 MED ORDER — MELOXICAM 15 MG PO TABS: 15.0000 mg | ORAL_TABLET | Freq: Every day | ORAL | Status: DC

## 2012-11-03 MED ORDER — AMLODIPINE BESY-BENAZEPRIL HCL 5-10 MG PO CAPS: 1.0000 | ORAL_CAPSULE | Freq: Every day | ORAL | Status: DC

## 2012-11-03 NOTE — Patient Instructions (Addendum)
Today we updated your med list in our EPIC system...    Continue your current medications the same...    We refilled your meds per request...  Today we did your follow up FASTING blood work...    We will call you w/ the results when avail...  Let's get on track w/ our diet & exercise program...    The goal is to lose 10-15 lbs...  Call for any questions.Marland KitchenMarland Kitchen

## 2012-11-04 ENCOUNTER — Encounter: Payer: Self-pay | Admitting: Pulmonary Disease

## 2012-11-04 NOTE — Progress Notes (Signed)
Subjective:     Patient ID: Paula Kelley, female   DOB: December 04, 1950, 63 y.o.   MRN: [id number]  HPI 62 y/o BF here for a follow up visit...   ~  November 05, 2011:  Yearly ROV & CPX> we had prev discussed Rheum eval for her arthritis but she never went preferring to use Aleve/Advil as needed for her knee complaints (as advised by her niece who is an Charity fundraiser);  We discussed trial MOBIC 15mg /d as needed vs Ortho/ Rheum evals...  See prob list below>>    >HBP> controlled on Lotrel 5-10 daily w/ BP= 124/76 & she is asymptomatic w/o CP, palpit, dizzy, syncope, SOB, edema, etc...    >She continues to take numerous supplements: Calcium, Vit D, MVI, Fish Oil, Fe...  ~  November 03, 2012:  Yearly ROV & [name] is feeling well w/o new complaints or concerns... We reviewed the following medical problems during today's office visit >>     AR> uses OTC antihist & mucinex as needed...    HBP> on Lotrel5-10; BP= 130/90 & she denies CP, palpit, dizzy, SOB, edema, etc...    Overweight> wt=182# which is up 7# this past yr; we reviewed diet, exercise, wt reduction strategies...    DJD> on Mobic15 & OTC analgesics as needed; she has knee pain & knows she may need surg but is holding off...    HA> she says that Mucinex works well for her & prevents her headaches...    Anxiety> not on medication...    Note> also takes numerous vitamin & mineral supplements... We reviewed prob list, meds, xrays and labs> see below for updates >> He had the Flu shot 10/13... LABS 1/14:  FLP- at goals on diet alone;  Chems- wnl;  CBC- wnl;  TSH=0.91;  VitD=38...          Problem List:   ALLERGIC RHINITIS (ICD-477.9) - she had allergy eval from DrESL in the past- dust & mold sensitive w/ trial of immunotherapy in the 90's...  Hx of UPPER RESPIRATORY INFECTION (ICD-465.9) - she has a hx of persitant/ recurrent sinus infections, congestion, drainage, etc... uses MUCNEX w/ relief.  HYPERTENSION (ICD-401.9) - controlled on LOTREL 5-10 daily...    ~  1/12: BP= 120/80 and similar at home, takes med regularly & tolerates well; denies HA, fatigue, visual changes, CP, palipit, dizziness, syncope, dyspnea, edema, etc... ~  CXR 1/12 showed clear lungs, mild tortuosity of thorAo, NAD... ~  EKG 1/12 showed NSR, essent WNL... ~  1/13: BP= 124/76 & she remains asymptomatic ~  1/14: on Lotrel5-10; BP= 130/90 & she denies CP, palpit, dizzy, SOB, edema, etc...  Hx of UTI (ICD-599.0) - no recent symptoms.  DEGENERATIVE JOINT DISEASE (ICD-715.90) - she uses ALEVE 2/d w/ good response... she has seen DrMortenson for  knee pain and had an MRI- she indicates that she may need TKR... she also had left shoulder pain w/ eval from DrNorris 2/07- he diagnosed rotator cuff impingement & gave her a shot. ~  1/11:  we discussed adding Glucosamine/ Chondroitin; also takes FishOil, calcium, MVI, Vit D 1000/d. ~  1/12:  we discussed adding knee brace during exercise program... ~  1/13:  We wrote for [facility] 15mg /d as needed for arthritis pain... ~  1/14:  on Mobic15 & OTC analgesics as needed; she has knee pain & knows she may need surg but is holding off.[name]  Hx of HEADACHE (ICD-784.0) - she requests written Rx for Mucinex (so her [employer] will pay)  to use as needed since it works for her in preventing HAs...  ANXIETY (ICD-300.00) - not on meds...  ANEMIA, MILD (ICD-285.9) - she take FeSO4 supplement. ~  labs 9/08 showed Hg= 11.6, MCV= 91, WBC/ Plat are norm, Fe= 47/ sat= 16%... ~  labs 1/10 showed Hg= 12.4, MCV= 92 ~  labs 1/11 showed Hg= 12.0, MCV= 94 ~  labs 1/12 showed  Hg= 12.4, MCV= 92 ~  Labs 1/13 showed Hg= 12.4, MCV= 90 ~  Labs 1/14 showed Hg= 12.6, MCV= 89  PHYSICAL EXAMINATION (ICD-V70.0) - her routine FLP has been normal on diet alone. ~  GI:   routine colonoscopy done 5/07 by DrPerry and was WNL- f/u planned 87yrs; on Metamucil prn... ~  GYN:  her GYN is DrHorvath> due for her annual PAP, & Mammograms, etc... she has not yet had a BMD. ~   Immunizations:  she gets the yearly Flu vaccine each autumn...   No past surgical history on file.   Outpatient Encounter Prescriptions as of 11/03/2012  Medication Sig Dispense Refill  . amLODipine-benazepril (LOTREL) 5-10 MG per capsule Take 1 capsule by mouth daily.  30 capsule  11  . aspirin 81 MG tablet Take 81 mg by mouth daily.      . calcium carbonate (OS-CAL) 600 MG TABS Take 600 mg by mouth daily.      . cholecalciferol (VITAMIN D) 1000 UNITS tablet Take 1,000 Units by mouth daily.      . ferrous sulfate 325 (65 FE) MG tablet Take 325 mg by mouth daily with breakfast.      . fish oil-omega-3 fatty acids 1000 MG capsule Take 2 g by mouth daily.      [name] ibuprofen (ADVIL,MOTRIN) 200 MG tablet Take 200 mg by mouth every 6 (six) hours as needed.      . meloxicam (MOBIC) 15 MG tablet Take 1 tablet (15 mg total) by mouth daily. As needed for arthritis pain  30 tablet  11  . MUCINEX 600 MG 12 hr tablet TAKE 1 TO 2 TABLETS BY MOUTH TWICE DAILY WITH PLENTY OF FLUIDS  100 tablet  0  . Multiple Vitamins-Minerals (CENTRUM SILVER ULTRA WOMENS) TABS TAKE 1 TABLET BY MOUTH EVERY DAY  200 tablet  5  . Multiple Vitamins-Minerals (MULTIVITAMIN PO) Take 1 tablet by mouth daily.      . psyllium (METAMUCIL) 58.6 % powder Take 1 packet by mouth daily.      . [DISCONTINUED] amLODipine-benazepril (LOTREL) 5-10 MG per capsule Take 1 capsule by mouth daily.  30 capsule  1  . [DISCONTINUED] meloxicam (MOBIC) 15 MG tablet Take 1 tablet (15 mg total) by mouth daily. As needed for arthritis pain  30 tablet  1    Allergies  Allergen Reactions  . Sulfonamide Derivatives     REACTION: unsure of reaction---this was as a child    Current Medications, Allergies, Past Medical History, Past Surgical History, Family History, and Social History were reviewed in [employer] record.    Review of Systems        The patient complains of nasal congestion, dyspnea on exertion, constipation, joint  pain, joint swelling, muscle weakness, stiffness, arthritis, and difficulty walking.  The patient denies fever, chills, sweats, anorexia, fatigue, weakness, malaise, weight loss, sleep disorder, blurring, diplopia, eye irritation, eye discharge, vision loss, eye pain, photophobia, earache, ear discharge, tinnitus, decreased hearing, nosebleeds, sore throat, hoarseness, chest pain, palpitations, syncope, orthopnea, PND, peripheral edema, cough, dyspnea at rest, excessive  sputum, hemoptysis, wheezing, pleurisy, nausea, vomiting, diarrhea, change in bowel habits, abdominal pain, melena, hematochezia, jaundice, gas/bloating, indigestion/heartburn, dysphagia, odynophagia, dysuria, hematuria, urinary frequency, urinary hesitancy, nocturia, incontinence, back pain, muscle cramps, sciatica, restless legs, leg pain at night, leg pain with exertion, rash, itching, dryness, suspicious lesions, paralysis, paresthesias, seizures, tremors, vertigo, transient blindness, frequent falls, frequent headaches, depression, anxiety, memory loss, confusion, cold intolerance, heat intolerance, polydipsia, polyphagia, polyuria, unusual weight change, abnormal bruising, bleeding, enlarged lymph nodes, urticaria, allergic rash, hay fever, and recurrent infections.     Objective:   Physical Exam     WD, WN, 61 y/o BF in NAD... GENERAL:  Alert & oriented; pleasant & cooperative... HEENT:  [place]/AT, EOM-wnl, PERRLA, EACs-clear, TMs-wnl, NOSE-clear, THROAT-clear & wnl. NECK:  Supple w/ fairROM; no JVD; normal carotid impulses w/o bruits; no thyromegaly or nodules palpated; no lymphadenopathy. CHEST:  Clear to P & A; without wheezes/ rales/ or rhonchi. HEART:  Regular Rhythm; without murmurs/ rubs/ or gallops. ABDOMEN:  Soft & nontender; normal bowel sounds; no organomegaly or masses detected. EXT: without deformities, mod arthritic changes; no varicose veins/ venous insuffic/ or edema. NEURO:  CN's intact; motor testing normal; sensory  testing normal; gait normal & balance OK. DERM:  No lesions noted; no rash etc...  RADIOLOGY DATA:  Reviewed in the EPIC EMR & discussed w/ the patient...  LABORATORY DATA:  Reviewed in the EPIC EMR & discussed w/ the patient...    >>Reviewed her fasting blood work> all WNL, looks good...   Assessment:     CPX>>   AR, URIs>  She uses OTC meds to keep symptoms under control...  HBP>  Well controlled on Lotrel 5-10 daily; she knows to avoid salt & get weight down...  DJD>  This is her CC; we will try MOBIC 15mg /d & she is encouraged to f/u w/ Orthopedist of her choice...  Hx Headaches>  None recently but she notes that Mucinex helps prevent them in her case...  Anxiety>  She manages very well & doesn't want anxiolytic Rx...  Anemia>  Hg chronically ~12, no bleeding & up to date on colon checks...     Plan:     Patient's Medications  New Prescriptions   No medications on file  Previous Medications   ASPIRIN 81 MG TABLET    Take 81 mg by mouth daily.   CALCIUM CARBONATE (OS-CAL) 600 MG TABS    Take 600 mg by mouth daily.   CHOLECALCIFEROL (VITAMIN D) 1000 UNITS TABLET    Take 1,000 Units by mouth daily.   FERROUS SULFATE 325 (65 FE) MG TABLET    Take 325 mg by mouth daily with breakfast.   FISH OIL-OMEGA-3 FATTY ACIDS 1000 MG CAPSULE    Take 2 g by mouth daily.   IBUPROFEN (ADVIL,MOTRIN) 200 MG TABLET    Take 200 mg by mouth every 6 (six) hours as needed.   MUCINEX 600 MG 12 HR TABLET    TAKE 1 TO 2 TABLETS BY MOUTH TWICE DAILY WITH PLENTY OF FLUIDS   MULTIPLE VITAMINS-MINERALS (CENTRUM SILVER ULTRA WOMENS) TABS    TAKE 1 TABLET BY MOUTH EVERY DAY   MULTIPLE VITAMINS-MINERALS (MULTIVITAMIN PO)    Take 1 tablet by mouth daily.   PSYLLIUM (METAMUCIL) 58.6 % POWDER    Take 1 packet by mouth daily.  Modified Medications   Modified Medication Previous Medication   AMLODIPINE-BENAZEPRIL (LOTREL) 5-10 MG PER CAPSULE amLODipine-benazepril (LOTREL) 5-10 MG per capsule      Take 1  capsule by mouth daily.    Take 1 capsule by mouth daily.   MELOXICAM (MOBIC) 15 MG TABLET meloxicam (MOBIC) 15 MG tablet      Take 1 tablet (15 mg total) by mouth daily. As needed for arthritis pain    Take 1 tablet (15 mg total) by mouth daily. As needed for arthritis pain  Discontinued Medications   No medications on file

## 2012-11-07 NOTE — Progress Notes (Signed)
Quick Note:  ATC patient, no answer LMOMTCB ______ 

## 2012-11-08 ENCOUNTER — Telehealth: Payer: Self-pay | Admitting: Pulmonary Disease

## 2012-11-08 NOTE — Telephone Encounter (Signed)
Spoke with pt and notified of results per Dr. Nadel. Pt verbalized understanding and denied any questions. 

## 2012-11-08 NOTE — Telephone Encounter (Signed)
Notes Recorded by Michele Mcalpine, MD on 11/04/2012 at 1:47 PM Please notify patient>  FLP looks good, at goals on diet alone... Chems, CBC, Thyroid, VitD> all wnl but vitD is borderline & rec incr to 2000u daily... --- ATC was on hold x 3 minutes WCb

## 2012-11-09 ENCOUNTER — Other Ambulatory Visit: Payer: Self-pay | Admitting: Pulmonary Disease

## 2013-02-05 ENCOUNTER — Telehealth: Payer: Self-pay | Admitting: Pulmonary Disease

## 2013-02-05 ENCOUNTER — Encounter: Payer: Self-pay | Admitting: *Deleted

## 2013-02-05 MED ORDER — LEVOCETIRIZINE DIHYDROCHLORIDE 5 MG PO TABS: 5.0000 mg | ORAL_TABLET | Freq: Every evening | ORAL | Status: DC

## 2013-02-05 NOTE — Telephone Encounter (Signed)
I spoke with pt and she is requesting an RX for levocetrizine for allergies. She stated she has been taking her husband and it works well for her. Please advise Dr. Kriste Basque thanks Last OV 11/03/12 Pending 11/02/13 Allergies  Allergen Reactions  . Sulfonamide Derivatives     REACTION: unsure of reaction---this was as a child

## 2013-02-05 NOTE — Telephone Encounter (Signed)
Per SN---  Ok to send in xyzall 5 mg  #90  1 daily per pts request.  Called and spoke with pt and she is aware of rx that has been sent in to the pharmacy. Nothing further is needed.

## 2013-04-09 ENCOUNTER — Other Ambulatory Visit: Payer: Self-pay | Admitting: Pulmonary Disease

## 2013-08-23 ENCOUNTER — Other Ambulatory Visit: Payer: Self-pay

## 2013-10-19 ENCOUNTER — Other Ambulatory Visit: Payer: Self-pay | Admitting: Pulmonary Disease

## 2013-11-02 ENCOUNTER — Ambulatory Visit: Payer: BC Managed Care – PPO | Admitting: Pulmonary Disease

## 2013-11-03 ENCOUNTER — Other Ambulatory Visit: Payer: Self-pay | Admitting: Pulmonary Disease

## 2013-11-17 ENCOUNTER — Other Ambulatory Visit: Payer: Self-pay | Admitting: Pulmonary Disease

## 2013-12-24 ENCOUNTER — Telehealth: Payer: Self-pay | Admitting: Pulmonary Disease

## 2013-12-24 MED ORDER — AMLODIPINE BESY-BENAZEPRIL HCL 5-10 MG PO CAPS: 1.0000 | ORAL_CAPSULE | Freq: Every day | ORAL | Status: DC

## 2013-12-24 NOTE — Telephone Encounter (Signed)
Rx will be refilled. OV has been scheduled for 01/08/14 at 3pm.

## 2013-12-24 NOTE — Telephone Encounter (Signed)
Per SN---  Ok to refill rx for the pt.  Thanks  Ok to schedule appt with SN on 3/24 at 3 pm.

## 2013-12-24 NOTE — Telephone Encounter (Signed)
Please advise where pt can be worked in at? thanks 

## 2013-12-26 ENCOUNTER — Other Ambulatory Visit: Payer: Self-pay | Admitting: Pulmonary Disease

## 2013-12-28 ENCOUNTER — Ambulatory Visit: Payer: BC Managed Care – PPO | Admitting: Pulmonary Disease

## 2014-01-03 NOTE — Telephone Encounter (Signed)
Pt has pending appt with SN 01/08/14. Will discuss at that time. rx has been sent

## 2014-01-08 ENCOUNTER — Ambulatory Visit (INDEPENDENT_AMBULATORY_CARE_PROVIDER_SITE_OTHER): Payer: BC Managed Care – PPO | Admitting: Pulmonary Disease

## 2014-01-08 ENCOUNTER — Ambulatory Visit (INDEPENDENT_AMBULATORY_CARE_PROVIDER_SITE_OTHER)
Admission: RE | Admit: 2014-01-08 | Discharge: 2014-01-08 | Disposition: A | Discharge status: Home / Self Care | Payer: BC Managed Care – PPO | Source: Ambulatory Visit | Attending: Pulmonary Disease | Admitting: Pulmonary Disease

## 2014-01-08 ENCOUNTER — Other Ambulatory Visit (INDEPENDENT_AMBULATORY_CARE_PROVIDER_SITE_OTHER): Payer: BC Managed Care – PPO

## 2014-01-08 ENCOUNTER — Encounter: Payer: Self-pay | Admitting: Pulmonary Disease

## 2014-01-08 VITALS — BP 126/82 | HR 85 | Temp 98.0°F | Ht 64.0 in | Wt 185.0 lb

## 2014-01-08 DIAGNOSIS — Z0000 Encounter for general adult medical examination without abnormal findings: Secondary | ICD-10-CM

## 2014-01-08 DIAGNOSIS — J309 Allergic rhinitis, unspecified: Secondary | ICD-10-CM

## 2014-01-08 DIAGNOSIS — M1990 Unspecified osteoarthritis, unspecified site: Secondary | ICD-10-CM

## 2014-01-08 DIAGNOSIS — I10 Essential (primary) hypertension: Secondary | ICD-10-CM

## 2014-01-08 DIAGNOSIS — F411 Generalized anxiety disorder: Secondary | ICD-10-CM

## 2014-01-08 DIAGNOSIS — R51 Headache: Secondary | ICD-10-CM

## 2014-01-08 LAB — CBC WITH DIFFERENTIAL/PLATELET
BASOS ABS: 0 10*3/uL (ref 0.0–0.1)
Basophils Relative: 0.2 % (ref 0.0–3.0)
Eosinophils Absolute: 0 10*3/uL (ref 0.0–0.7)
Eosinophils Relative: 0 % (ref 0.0–5.0)
HCT: 36.8 % (ref 36.0–46.0)
HEMOGLOBIN: 12.1 g/dL (ref 12.0–15.0)
Lymphocytes Relative: 43.9 % (ref 12.0–46.0)
Lymphs Abs: 1.8 10*3/uL (ref 0.7–4.0)
MCHC: 32.9 g/dL (ref 30.0–36.0)
MCV: 89.1 fl (ref 78.0–100.0)
MONO ABS: 0.4 10*3/uL (ref 0.1–1.0)
MONOS PCT: 10.6 % (ref 3.0–12.0)
NEUTROS ABS: 1.8 10*3/uL (ref 1.4–7.7)
Neutrophils Relative %: 45.3 % (ref 43.0–77.0)
Platelets: 240 10*3/uL (ref 150.0–400.0)
RBC: 4.14 Mil/uL (ref 3.87–5.11)
RDW: 13.8 % (ref 11.5–14.6)
WBC: 4.1 10*3/uL — ABNORMAL LOW (ref 4.5–10.5)

## 2014-01-08 LAB — BASIC METABOLIC PANEL
BUN: 9 mg/dL (ref 6–23)
CO2: 31 mEq/L (ref 19–32)
Calcium: 9.4 mg/dL (ref 8.4–10.5)
Chloride: 100 mEq/L (ref 96–112)
Creatinine, Ser: 0.8 mg/dL (ref 0.4–1.2)
GFR: 99.03 mL/min (ref >60.00)
Glucose, Bld: 85 mg/dL (ref 70–99)
Potassium: 3.6 mEq/L (ref 3.5–5.1)
Sodium: 137 mEq/L (ref 135–145)

## 2014-01-08 LAB — LIPID PANEL
Cholesterol: 144 mg/dL (ref 0–200)
HDL: 66.3 mg/dL (ref >39.00)
LDL Cholesterol: 70 mg/dL (ref 0–99)
Total CHOL/HDL Ratio: 2
Triglycerides: 37 mg/dL (ref 0.0–149.0)
VLDL: 7.4 mg/dL (ref 0.0–40.0)

## 2014-01-08 LAB — HEPATIC FUNCTION PANEL
ALBUMIN: 4.2 g/dL (ref 3.5–5.2)
ALK PHOS: 67 U/L (ref 39–117)
ALT: 21 U/L (ref 0–35)
AST: 27 U/L (ref 0–37)
Bilirubin, Direct: 0.1 mg/dL (ref 0.0–0.3)
Total Bilirubin: 0.7 mg/dL (ref 0.3–1.2)
Total Protein: 7.3 g/dL (ref 6.0–8.3)

## 2014-01-08 MED ORDER — CENTRUM SILVER ULTRA WOMENS PO TABS: ORAL_TABLET | ORAL | Status: DC

## 2014-01-08 MED ORDER — GUAIFENESIN ER 600 MG PO TB12: ORAL_TABLET | ORAL | Status: DC

## 2014-01-08 MED ORDER — LEVOCETIRIZINE DIHYDROCHLORIDE 5 MG PO TABS: 5.0000 mg | ORAL_TABLET | Freq: Every evening | ORAL | Status: DC

## 2014-01-08 MED ORDER — MELOXICAM 15 MG PO TABS: ORAL_TABLET | ORAL | Status: DC

## 2014-01-08 MED ORDER — AMLODIPINE BESY-BENAZEPRIL HCL 5-10 MG PO CAPS: 1.0000 | ORAL_CAPSULE | Freq: Every day | ORAL | Status: DC

## 2014-01-08 NOTE — Patient Instructions (Signed)
Today we updated your med list in our EPIC system...    Continue your current medications the same...  Today we did your follow up CXR, EKG, and FASTING blood work...    We will contact you w/ the results when available...   Keep up the good work w/ your diet & exercise program...    Work on weight reduction...  Call for any questions or if we can be of service in any way.[name]..[name]

## 2014-01-08 NOTE — Progress Notes (Signed)
Subjective:     Patient ID: Paula Kelley, female   DOB: Aug 20, 1951, 63 y.o.   MRN: [id number]  HPI 63 y/o BF here for a follow up visit...   ~  January 63, 2013:  Yearly ROV & CPX> we had prev discussed Rheum eval for her arthritis but she never went preferring to use Aleve/Advil as needed for her knee complaints (as advised by her niece who is an Charity fundraiser);  We discussed trial MOBIC 15mg /d as needed vs Ortho/ Rheum evals...  See prob list below>>    >HBP> controlled on Lotrel 5-10 daily w/ BP= 124/76 & she is asymptomatic w/o CP, palpit, dizzy, syncope, SOB, edema, etc...    >She continues to take numerous supplements: Calcium, Vit D, MVI, Fish Oil, Fe...  ~  November 03, 2012:  Yearly ROV & [name] is feeling well w/o new complaints or concerns... We reviewed the following medical problems during today's office visit >>     AR> uses OTC antihist & mucinex as needed...    HBP> on Lotrel5-10; BP= 130/90 & she denies CP, palpit, dizzy, SOB, edema, etc...    Overweight> wt=182# which is up 7# this past yr; we reviewed diet, exercise, wt reduction strategies...    DJD> on Mobic15 & OTC analgesics as needed; she has knee pain & knows she may need surg but is holding off...    HA> she says that Mucinex works well for her & prevents her headaches...    Anxiety> not on medication...    Note> also takes numerous vitamin & mineral supplements... We reviewed prob list, meds, xrays and labs> see below for updates >> He had the Flu shot 10/13... LABS 1/14:  FLP- at goals on diet alone;  Chems- wnl;  CBC- wnl;  TSH=0.91;  VitD=38...  ~  January 08, 2014:  63mo ROV & CPX> [name] has had a good yr- notes some sinus drainage & wanted me to know that she has started "move free" to help her joints...    AR> uses OTC antihist vs Xyzal5 & Mucinex as needed...    HBP> on Lotrel5-10; BP= 126/82 & she denies CP, palpit, dizzy, SOB, edema, etc...    Overweight> wt=185# which is up 3# this past yr; we reviewed diet, exercise, wt  reduction strategies...    DJD> on Mobic15 & OTC analgesics as needed; she has knee pain & knows she may need surg but is holding off...    HA> she says that Mucinex works well for her & prevents her headaches...    Anxiety> not on medication...    Note> also takes numerous vitamin & mineral supplements... We reviewed prob list, meds, xrays and labs> see below for updates >>   CXR 3/15 showed normal heart size, clear lungs w/ ?some right hilar fullness, no change & NAD.Marland KitchenMarland Kitchen   EKG 3/15 showed NSR, rate70, wnl, NAD...  LABS 3/15:  FLP- at goals on diet & fish oil;  Chems- wnl;  CBC- ok w/ Hg=12.1, MCV=89;  TSH=0.97...            Problem List:   ALLERGIC RHINITIS (ICD-477.9) - she had allergy eval from DrESL in the past- dust & mold sensitive w/ trial of immunotherapy in the 90's...  Hx of UPPER RESPIRATORY INFECTION (ICD-465.9) - she has a hx of persitant/ recurrent sinus infections, congestion, drainage, etc... uses MUCNEX w/ relief.  HYPERTENSION (ICD-401.9) - controlled on LOTREL 5-10 daily...  ~  1/12: BP= 120/80 and similar at home, takes  med regularly & tolerates well; denies HA, fatigue, visual changes, CP, palipit, dizziness, syncope, dyspnea, edema, etc... ~  CXR 1/12 showed clear lungs, mild tortuosity of thorAo, NAD... ~  EKG 1/12 showed NSR, essent WNL... ~  1/13: BP= 124/76 & she remains asymptomatic ~  1/14: on Lotrel5-10; BP= 130/90 & she denies CP, palpit, dizzy, SOB, edema, etc... ~  3/15: on Lotrel5-10; BP= 126/82 & she denies CP, palpit, dizzy, SOB, edema, etc.  Hx of UTI (ICD-599.0) - no recent symptoms.  DEGENERATIVE JOINT DISEASE (ICD-715.90) - she uses ALEVE 2/d w/ good response... she has seen DrMortenson for  knee pain and had an MRI- she indicates that she may need TKR... she also had left shoulder pain w/ eval from DrNorris 2/07- he diagnosed rotator cuff impingement & gave her a shot. ~  1/11:  we discussed adding Glucosamine/ Chondroitin; also takes FishOil,  calcium, MVI, Vit D 1000/d. ~  1/12:  we discussed adding knee brace during exercise program... ~  1/13:  We wrote for [facility] 15mg /d as needed for arthritis pain... ~  1/14:  on Mobic15 & OTC analgesics as needed; she has knee pain & knows she may need surg but is holding off.[name]  Hx of HEADACHE (ICD-784.0) - she requests written Rx for Mucinex (so her [employer] will pay) to use as needed since it works for her in preventing HAs...  ANXIETY (ICD-300.00) - not on meds...  ANEMIA, MILD (ICD-285.9) - she take FeSO4 supplement. ~  labs 9/08 showed Hg= 11.6, MCV= 91, WBC/ Plat are norm, Fe= 47/ sat= 16%... ~  labs 1/10 showed Hg= 12.4, MCV= 92 ~  labs 1/11 showed Hg= 12.0, MCV= 94 ~  labs 1/12 showed  Hg= 12.4, MCV= 92 ~  Labs 1/13 showed Hg= 12.4, MCV= 90 ~  Labs 1/14 showed Hg= 12.6, MCV= 89 ~  Labs 3/15 showed Hg= 12.1, MCV= 89  PHYSICAL EXAMINATION (ICD-V70.0) - her routine FLP has been normal on diet alone. ~  GI:   routine colonoscopy done 5/07 by DrPerry and was WNL- f/u planned 63yrs; on Metamucil prn... ~  GYN:  her GYN is DrHorvath> due for her annual PAP, & Mammograms, etc... she has not yet had a BMD. ~  Immunizations:  she gets the yearly Flu vaccine each autumn...   History reviewed. No pertinent past surgical history.   Outpatient Encounter Prescriptions as of 01/08/2014  Medication Sig  . amLODipine-benazepril (LOTREL) 5-10 MG per capsule Take 1 capsule by mouth daily.  [name] aspirin 81 MG tablet Take 81 mg by mouth daily.  . calcium carbonate (OS-CAL) 600 MG TABS Take 600 mg by mouth daily.  . cholecalciferol (VITAMIN D) 1000 UNITS tablet Take 1,000 Units by mouth daily.  . ferrous sulfate 325 (65 FE) MG tablet Take 325 mg by mouth daily with breakfast.  . fish oil-omega-3 fatty acids 1000 MG capsule Take 2 g by mouth daily.  [name] ibuprofen (ADVIL,MOTRIN) 200 MG tablet Take 200 mg by mouth every 6 (six) hours as needed.  [name] levocetirizine (XYZAL) 5 MG tablet Take 1 tablet (5 mg  total) by mouth every evening.  . meloxicam (MOBIC) 15 MG tablet TAKE 1 TABLET BY MOUTH EVERY DAY AS NEEDED FOR ARTHRITIS PAIN  . MUCINEX 600 MG 12 hr tablet TAKE 1-2 TABLETS BY MOUTH TWICE DAILY AS NEEDED  . Multiple Vitamins-Minerals (CENTRUM SILVER ULTRA WOMENS) TABS TAKE 1 TABLET BY MOUTH EVERY DAY  . Multiple Vitamins-Minerals (MULTIVITAMIN PO) Take 1 tablet by mouth  daily.  . psyllium (METAMUCIL) 58.6 % powder Take 1 packet by mouth daily.    Allergies  Allergen Reactions  . Sulfonamide Derivatives     REACTION: unsure of reaction---this was as a child    Current Medications, Allergies, Past Medical History, Past Surgical History, Family History, and Social History were reviewed in [employer] [facility] electronic medical record.    Review of Systems        The patient complains of nasal congestion, dyspnea on exertion, constipation, joint pain, joint swelling, muscle weakness, stiffness, arthritis, and difficulty walking.  The patient denies fever, chills, sweats, anorexia, fatigue, weakness, malaise, weight loss, sleep disorder, blurring, diplopia, eye irritation, eye discharge, vision loss, eye pain, photophobia, earache, ear discharge, tinnitus, decreased hearing, nosebleeds, sore throat, hoarseness, chest pain, palpitations, syncope, orthopnea, PND, peripheral edema, cough, dyspnea at rest, excessive sputum, hemoptysis, wheezing, pleurisy, nausea, vomiting, diarrhea, change in bowel habits, abdominal pain, melena, hematochezia, jaundice, gas/bloating, indigestion/heartburn, dysphagia, odynophagia, dysuria, hematuria, urinary frequency, urinary hesitancy, nocturia, incontinence, back pain, muscle cramps, sciatica, restless legs, leg pain at night, leg pain with exertion, rash, itching, dryness, suspicious lesions, paralysis, paresthesias, seizures, tremors, vertigo, transient blindness, frequent falls, frequent headaches, depression, anxiety, memory loss, confusion, cold intolerance, heat  intolerance, polydipsia, polyphagia, polyuria, unusual weight change, abnormal bruising, bleeding, enlarged lymph nodes, urticaria, allergic rash, hay fever, and recurrent infections.     Objective:   Physical Exam     WD, WN, 62 y/o BF in NAD... GENERAL:  Alert & oriented; pleasant & cooperative... HEENT:  [place]/AT, EOM-wnl, PERRLA, EACs-clear, TMs-wnl, NOSE-clear, THROAT-clear & wnl. NECK:  Supple w/ fairROM; no JVD; normal carotid impulses w/o bruits; no thyromegaly or nodules palpated; no lymphadenopathy. CHEST:  Clear to P & A; without wheezes/ rales/ or rhonchi. HEART:  Regular Rhythm; without murmurs/ rubs/ or gallops. ABDOMEN:  Soft & nontender; normal bowel sounds; no organomegaly or masses detected. EXT: without deformities, mod arthritic changes; no varicose veins/ venous insuffic/ or edema. NEURO:  CN's intact; motor testing normal; sensory testing normal; gait normal & balance OK. DERM:  No lesions noted; no rash etc...  RADIOLOGY DATA:  Reviewed in the EPIC EMR & discussed w/ the patient...  LABORATORY DATA:  Reviewed in the EPIC EMR & discussed w/ the patient...    >>Reviewed her fasting blood work> all WNL, looks good...   Assessment:     CPX>>   AR, URIs>  She uses OTC meds to keep symptoms under control...  HBP>  Well controlled on Lotrel 5-10 daily; she knows to avoid salt & get weight down...  DJD>  This is her CC; on MOBIC 15mg /d prn & she is encouraged to f/u w/ Orthopedist of her choice...  Hx Headaches>  None recently but she notes that Mucinex helps prevent them in her case...  Anxiety>  She manages very well & doesn't want anxiolytic Rx...  Anemia>  Hg chronically ~12, no bleeding & up to date on colon checks...     Plan:     Patient's Medications  New Prescriptions   No medications on file  Previous Medications   ASPIRIN 81 MG TABLET    Take 81 mg by mouth daily.   CALCIUM CARBONATE (OS-CAL) 600 MG TABS    Take 600 mg by mouth daily.    CHOLECALCIFEROL (VITAMIN D) 1000 UNITS TABLET    Take 1,000 Units by mouth daily.   FERROUS SULFATE 325 (65 FE) MG TABLET    Take 325 mg by mouth daily with  breakfast.   FISH OIL-OMEGA-3 FATTY ACIDS 1000 MG CAPSULE    Take 2 g by mouth daily.   IBUPROFEN (ADVIL,MOTRIN) 200 MG TABLET    Take 200 mg by mouth every 6 (six) hours as needed.   PSYLLIUM (METAMUCIL) 58.6 % POWDER    Take 1 packet by mouth daily.  Modified Medications   Modified Medication Previous Medication   AMLODIPINE-BENAZEPRIL (LOTREL) 5-10 MG PER CAPSULE amLODipine-benazepril (LOTREL) 5-10 MG per capsule      Take 1 capsule by mouth daily.    Take 1 capsule by mouth daily.   GUAIFENESIN (MUCINEX) 600 MG 12 HR TABLET MUCINEX 600 MG 12 hr tablet      TAKE 1-2 TABLETS BY MOUTH TWICE DAILY AS NEEDED    TAKE 1-2 TABLETS BY MOUTH TWICE DAILY AS NEEDED   LEVOCETIRIZINE (XYZAL) 5 MG TABLET levocetirizine (XYZAL) 5 MG tablet      Take 1 tablet (5 mg total) by mouth every evening.    Take 1 tablet (5 mg total) by mouth every evening.   MELOXICAM (MOBIC) 15 MG TABLET meloxicam (MOBIC) 15 MG tablet      TAKE 1 TABLET BY MOUTH EVERY DAY AS NEEDED FOR ARTHRITIS PAIN    TAKE 1 TABLET BY MOUTH EVERY DAY AS NEEDED FOR ARTHRITIS PAIN   MULTIPLE VITAMINS-MINERALS (CENTRUM SILVER ULTRA WOMENS) TABS Multiple Vitamins-Minerals (CENTRUM SILVER ULTRA WOMENS) TABS      TAKE 1 TABLET BY MOUTH EVERY DAY    TAKE 1 TABLET BY MOUTH EVERY DAY  Discontinued Medications   MULTIPLE VITAMINS-MINERALS (MULTIVITAMIN PO)    Take 1 tablet by mouth daily.

## 2014-01-09 LAB — TSH: TSH: 0.97 u[IU]/mL (ref 0.35–5.50)

## 2014-01-10 ENCOUNTER — Telehealth: Payer: Self-pay | Admitting: Pulmonary Disease

## 2014-01-10 NOTE — Telephone Encounter (Signed)
Dr Trish MageLescber not taking any more SN pt's  LMTCB

## 2014-01-11 NOTE — Telephone Encounter (Signed)
Spoke with patient-Pt is aware that [facility] [address] is not taking anymore patients from [facility] and would [facility] to [facility]. Pt was given their number and will call to make appointment. Nothing more needed at this time.

## 2014-01-14 ENCOUNTER — Telehealth: Payer: Self-pay | Admitting: Pulmonary Disease

## 2014-01-14 NOTE — Telephone Encounter (Signed)
Notes Recorded by Michele McalpineScott M Nadel, MD on 01/10/2014 at 5:04 PM Please notify patient>  FLP is WNL on diet + fish oil; continue same... Chems, LFTs, Thyroid> all WNL.[name].[name]. CBC is ok but Hg=12.1 lower end of normal & rec Women's formula MVI + Fe supplement OTC...  Notes Recorded by Michele McalpineScott M Nadel, MD on 01/10/2014 at 5:02 PM Please notify patient>  CXR is clear, norm heart size, NAD.[name].[name]. ---  I spoke with patient about results and she verbalized understanding and had no questions

## 2014-02-04 ENCOUNTER — Encounter: Payer: Self-pay | Admitting: Family Medicine

## 2014-02-04 ENCOUNTER — Ambulatory Visit (INDEPENDENT_AMBULATORY_CARE_PROVIDER_SITE_OTHER): Payer: BC Managed Care – PPO | Admitting: Family Medicine

## 2014-02-04 VITALS — BP 120/82 | Temp 98.8°F | Ht 64.0 in | Wt 186.0 lb

## 2014-02-04 DIAGNOSIS — M1990 Unspecified osteoarthritis, unspecified site: Secondary | ICD-10-CM

## 2014-02-04 DIAGNOSIS — I10 Essential (primary) hypertension: Secondary | ICD-10-CM

## 2014-02-04 DIAGNOSIS — D649 Anemia, unspecified: Secondary | ICD-10-CM

## 2014-02-04 DIAGNOSIS — J309 Allergic rhinitis, unspecified: Secondary | ICD-10-CM

## 2014-02-04 MED ORDER — GUAIFENESIN ER 600 MG PO TB12: ORAL_TABLET | ORAL | Status: DC

## 2014-02-04 MED ORDER — LEVOCETIRIZINE DIHYDROCHLORIDE 5 MG PO TABS: 5.0000 mg | ORAL_TABLET | Freq: Every evening | ORAL | Status: DC

## 2014-02-04 MED ORDER — CENTRUM SILVER ULTRA WOMENS PO TABS: ORAL_TABLET | ORAL | Status: DC

## 2014-02-04 MED ORDER — AMLODIPINE BESY-BENAZEPRIL HCL 5-10 MG PO CAPS: 1.0000 | ORAL_CAPSULE | Freq: Every day | ORAL | Status: DC

## 2014-02-04 MED ORDER — MELOXICAM 15 MG PO TABS: ORAL_TABLET | ORAL | Status: DC

## 2014-02-04 NOTE — Progress Notes (Signed)
Pre visit review using our clinic review tool, if applicable. No additional management support is needed unless otherwise documented below in the visit note. 

## 2014-02-04 NOTE — Progress Notes (Signed)
Chief Complaint  Patient presents with  . Establish Care    HPI:  Paula Kelley is here to establish care.  Last PCP and physical:  Has the following chronic problems and concerns today:  HTN: -stable  Allergies: -musinex - wants rx to use flex money -hx of allergy shots  DDD: -uses mobic  Patient Active Problem List   Diagnosis Date Noted  . ANEMIA, MILD 11/01/2008  . UPPER RESPIRATORY INFECTION 11/01/2008  . ALLERGIC RHINITIS 11/01/2008  . UTI 11/01/2008  . ANXIETY 09/03/2008  . HYPERTENSION 09/03/2008  . DEGENERATIVE JOINT DISEASE 09/03/2008  . HEADACHE 09/03/2008    Health Maintenance:  ROS: See pertinent positives and negatives per HPI.  Past Medical History  Diagnosis Date  . URI (upper respiratory infection)   . Allergic rhinitis   . Hypertension   . UTI (lower urinary tract infection)   . DJD (degenerative joint disease)   . Headache(784.0)   . Anxiety   . Mild anemia     History reviewed. No pertinent family history.  History   Social History  . Marital Status: Married    Spouse Name: N/A    Number of Children: N/A  . Years of Education: N/A   Occupational History  . retired    Social History Main Topics  . Smoking status: Never Smoker   . Smokeless tobacco: None  . Alcohol Use: Yes     Comment: social use  . Drug Use: None  . Sexual Activity: None   Other Topics Concern  . None   Social History Narrative   Work or School: works at [employer] and T at [employer]      Home Situation: lives with husband      Spiritual Beliefs: methodist      Lifestyle: walks; trying to eat a healthy diet             Current outpatient prescriptions:amLODipine-benazepril (LOTREL) 5-10 MG per capsule, Take 1 capsule by mouth daily., Disp: 90 capsule, Rfl: 3;  aspirin 81 MG tablet, Take 81 mg by mouth daily., Disp: , Rfl: ;  calcium carbonate (OS-CAL) 600 MG TABS, Take 600 mg by mouth daily., Disp: , Rfl: ;  cholecalciferol (VITAMIN D) 1000 UNITS tablet,  Take 1,000 Units by mouth daily., Disp: , Rfl:  ferrous sulfate 325 (65 FE) MG tablet, Take 325 mg by mouth daily with breakfast., Disp: , Rfl: ;  fish oil-omega-3 fatty acids 1000 MG capsule, Take 2 g by mouth daily., Disp: , Rfl: ;  guaiFENesin (MUCINEX) 600 MG 12 hr tablet, TAKE 1-2 TABLETS BY MOUTH TWICE DAILY AS NEEDED, Disp: 120 tablet, Rfl: 1;  ibuprofen (ADVIL,MOTRIN) 200 MG tablet, Take 200 mg by mouth every 6 (six) hours as needed., Disp: , Rfl:  levocetirizine (XYZAL) 5 MG tablet, Take 1 tablet (5 mg total) by mouth every evening., Disp: 90 tablet, Rfl: 3;  meloxicam (MOBIC) 15 MG tablet, TAKE 1 TABLET BY MOUTH EVERY DAY AS NEEDED FOR ARTHRITIS PAIN, Disp: 30 tablet, Rfl: 11;  Multiple Vitamins-Minerals (CENTRUM SILVER ULTRA WOMENS) TABS, TAKE 1 TABLET BY MOUTH EVERY DAY, Disp: 200 tablet, Rfl: 0;  psyllium (METAMUCIL) 58.6 % powder, Take 1 packet by mouth daily., Disp: , Rfl:   EXAM:  Filed Vitals:   02/04/14 1609  BP: 120/82  Temp: 98.8 F (37.1 C)    Body mass index is 31.91 kg/(m^2).  GENERAL: vitals reviewed and listed above, alert, oriented, appears well hydrated and in no acute distress  HEENT:  atraumatic, conjunttiva clear, no obvious abnormalities on inspection of external nose and ears  NECK: no obvious masses on inspection  LUNGS: clear to auscultation bilaterally, no wheezes, rales or rhonchi, good air movement  CV: HRRR, no peripheral edema  MS: moves all extremities without noticeable abnormality  PSYCH: pleasant and cooperative, no obvious depression or anxiety  ASSESSMENT AND PLAN:  Discussed the following assessment and plan:  ALLERGIC RHINITIS - Plan: levocetirizine (XYZAL) 5 MG tablet, DISCONTINUED: guaiFENesin (MUCINEX) 600 MG 12 hr tablet  HYPERTENSION  DEGENERATIVE JOINT DISEASE - Plan: Multiple Vitamins-Minerals (CENTRUM SILVER ULTRA WOMENS) TABS  -We reviewed the PMH, PSH, FH, SH, Meds and Allergies. -We provided refills for any medications  we will prescribe as needed. -We addressed current concerns per orders and patient instructions. -We have asked for records for pertinent exams, studies, vaccines and notes from previous providers. -We have advised patient to follow up per instructions below. -gave her rx for OTC meds    -Patient advised to return or notify a doctor immediately if symptoms worsen or persist or new concerns arise.  There are no Patient Instructions on file for this visit.   Terressa KoyanagiHannah R. Kim

## 2014-02-05 ENCOUNTER — Telehealth: Payer: Self-pay | Admitting: Family Medicine

## 2014-02-05 NOTE — Telephone Encounter (Signed)
Relevant patient education assigned to patient using Emmi. ° °

## 2014-03-05 ENCOUNTER — Telehealth: Payer: Self-pay | Admitting: Family Medicine

## 2014-03-05 NOTE — Telephone Encounter (Signed)
Left message for patient to return call.

## 2014-03-05 NOTE — Telephone Encounter (Signed)
Left message at the pts home number to return my call.

## 2014-03-05 NOTE — Telephone Encounter (Signed)
Attempted to reach patient x2 and no answer. Left message on # that she called on and attempted to reach patient on listed #'s on chart. JK/CAN

## 2014-03-12 NOTE — Telephone Encounter (Signed)
Pt returning call to state her voice has returned since she called the office on last week to speak with the nurse.  Therefore, she does not need and treatment recommendations.  Pt denies fever or sore throat at this time.  No further assistance required, pt will call back if she needs anything else.

## 2014-03-12 NOTE — Telephone Encounter (Signed)
Dr. Kim FYI

## 2014-07-18 LAB — HM MAMMOGRAPHY: HM MAMMO: NORMAL

## 2015-02-06 ENCOUNTER — Other Ambulatory Visit: Payer: Self-pay | Admitting: Family Medicine

## 2015-02-06 ENCOUNTER — Encounter: Payer: Self-pay | Admitting: Family Medicine

## 2015-02-06 ENCOUNTER — Ambulatory Visit (INDEPENDENT_AMBULATORY_CARE_PROVIDER_SITE_OTHER): Payer: BC Managed Care – PPO | Admitting: Family Medicine

## 2015-02-06 VITALS — BP 130/86 | HR 72 | Temp 97.8°F | Ht 63.25 in | Wt 186.2 lb

## 2015-02-06 DIAGNOSIS — Z0000 Encounter for general adult medical examination without abnormal findings: Secondary | ICD-10-CM | POA: Diagnosis not present

## 2015-02-06 DIAGNOSIS — D649 Anemia, unspecified: Secondary | ICD-10-CM | POA: Diagnosis not present

## 2015-02-06 DIAGNOSIS — Z91048 Other nonmedicinal substance allergy status: Secondary | ICD-10-CM

## 2015-02-06 DIAGNOSIS — I10 Essential (primary) hypertension: Secondary | ICD-10-CM | POA: Diagnosis not present

## 2015-02-06 DIAGNOSIS — Z6832 Body mass index (BMI) 32.0-32.9, adult: Secondary | ICD-10-CM | POA: Diagnosis not present

## 2015-02-06 DIAGNOSIS — Z23 Encounter for immunization: Secondary | ICD-10-CM

## 2015-02-06 DIAGNOSIS — Z9109 Other allergy status, other than to drugs and biological substances: Secondary | ICD-10-CM

## 2015-02-06 MED ORDER — LEVOCETIRIZINE DIHYDROCHLORIDE 2.5 MG/5ML PO SOLN: 2.5000 mg | Freq: Every evening | ORAL | Status: DC

## 2015-02-06 MED ORDER — FLUTICASONE PROPIONATE 50 MCG/ACT NA SUSP: 2.0000 | Freq: Every day | NASAL | Status: DC

## 2015-02-06 MED ORDER — AMLODIPINE BESY-BENAZEPRIL HCL 5-10 MG PO CAPS: 1.0000 | ORAL_CAPSULE | Freq: Every day | ORAL | Status: DC

## 2015-02-06 NOTE — Addendum Note (Signed)
Addended by: Sallee LangeFUNDERBURK, JO A on: 02/06/2015 05:00 PM   Modules accepted: Orders

## 2015-02-06 NOTE — Patient Instructions (Signed)
BEFORE YOU LEAVE: -Tdap -labs  -We have ordered labs or studies at this visit. It can take up to 1-2 weeks for results and processing. We will contact you with instructions IF your results are abnormal. Normal results will be released to your [facility]. If you have not heard from us or can not find your results in [facility] in 2 weeks please contact our office.  For the allergies: -take the zyxal daily and flonase 2 sprays each nostril daily for 1 month, then 1 spray each nostril daily -stop the musinex   We recommend the following healthy lifestyle measures: - eat a healthy diet consisting of lots of vegetables, fruits, beans, nuts, seeds, healthy meats such as white chicken and fish and whole grains.  - avoid fried foods, fast food, processed foods, sodas, red meet and other fattening foods.  - get a least 150 minutes of aerobic exercise per week.

## 2015-02-06 NOTE — Progress Notes (Signed)
HPI:  Here for CPE:  -Concerns and/or follow up today:   HTN: -meds: amlodipine-benazepril 5-10mg  -denies: CP, DOE, swelling  Obesity: -she is walking 1 hour 3 days per week - but not as good -she has a fit bit - trying to do 10000 per day -diet: is so so  Iron def anemia: -reports she has had this her whole life and take iron -denies dizziness, palpitations, weakness, melena, hematochezia  Allergic Rhinitis: -chronic PND, sinus issues -hx allergies and had allergy shots in the past  -Exercise: no regular exercise  -Taking folic acid, vitamin D or calcium: no  -Diabetes and Dyslipidemia Screening: FASTING today  -Hx of HTN: no  -Vaccines: UTD  -pap history: sees gyn for paps - reports did this in October with Dr. Buel ReamHorvett  -FDLMP: n/a  -sexual activity: yes, female partner, no new partners  -wants STI testing: no  -FH breast, colon or ovarian ca: see FH Last mammogram: done Last colon cancer screening: done   -Alcohol, Tobacco, drug use: see social history  Review of Systems - no fevers, unintentional weight loss, vision loss, hearing loss, chest pain, sob, hemoptysis, melena, hematochezia, hematuria, genital discharge, changing or concerning skin lesions, bleeding, bruising, loc, thoughts of self harm or SI  Past Medical History  Diagnosis Date  . URI (upper respiratory infection)   . Allergic rhinitis   . Hypertension   . UTI (lower urinary tract infection)   . DJD (degenerative joint disease)   . Headache(784.0)   . Anxiety   . Mild anemia     No past surgical history on file.  No family history on file.  History   Social History  . Marital Status: Married    Spouse Name: N/A  . Number of Children: N/A  . Years of Education: N/A   Occupational History  . retired    Social History Main Topics  . Smoking status: Never Smoker   . Smokeless tobacco: Not on file  . Alcohol Use: Yes     Comment: social use  . Drug Use: Not on file  .  Sexual Activity: Not on file   Other Topics Concern  . None   Social History Narrative   Work or School: works at [employer] and T at [employer]      Home Situation: lives with husband      Spiritual Beliefs: methodist      Lifestyle: walks; trying to eat a healthy diet              Current outpatient prescriptions:  .  amLODipine-benazepril (LOTREL) 5-10 MG per capsule, Take 1 capsule by mouth daily., Disp: 90 capsule, Rfl: 3 .  aspirin 81 MG tablet, Take 81 mg by mouth daily., Disp: , Rfl:  .  calcium carbonate (OS-CAL) 600 MG TABS, Take 600 mg by mouth daily., Disp: , Rfl:  .  cholecalciferol (VITAMIN D) 1000 UNITS tablet, Take 1,000 Units by mouth daily., Disp: , Rfl:  .  ferrous sulfate 325 (65 FE) MG tablet, Take 325 mg by mouth daily with breakfast., Disp: , Rfl:  .  fish oil-omega-3 fatty acids 1000 MG capsule, Take 2 g by mouth daily., Disp: , Rfl:  .  guaiFENesin (MUCINEX) 600 MG 12 hr tablet, TAKE 1-2 TABLETS BY MOUTH TWICE DAILY AS NEEDED, Disp: 120 tablet, Rfl: 1 .  meloxicam (MOBIC) 15 MG tablet, TAKE 1 TABLET BY MOUTH EVERY DAY, Disp: 30 tablet, Rfl: 0 .  Multiple Vitamins-Minerals (CENTRUM SILVER ULTRA WOMENS) TABS,  TAKE 1 TABLET BY MOUTH EVERY DAY, Disp: 200 tablet, Rfl: 0 .  psyllium (METAMUCIL) 58.6 % powder, Take 1 packet by mouth daily., Disp: , Rfl:  .  fluticasone (FLONASE) 50 MCG/ACT nasal spray, Place 2 sprays into both nostrils daily., Disp: 16 g, Rfl: 6 .  levocetirizine (XYZAL) 2.5 MG/5ML solution, Take 5 mLs (2.5 mg total) by mouth every evening., Disp: 148 mL, Rfl: 12  EXAM:  Filed Vitals:   02/06/15 1557  BP: 130/86  Pulse: 72  Temp: 97.8 F (36.6 C)    GENERAL: vitals reviewed and listed below, alert, oriented, appears well hydrated and in no acute distress  HEENT: head atraumatic, PERRLA, normal appearance of eyes, ears, nose and mouth. moist mucus membranes.  NECK: supple, no masses or lymphadenopathy  LUNGS: clear to auscultation bilaterally, no  rales, rhonchi or wheeze  CV: HRRR, no peripheral edema or cyanosis, normal pedal pulses  BREAST: declined  ABDOMEN: bowel sounds normal, soft, non tender to palpation, no masses, no rebound or guarding  GU: declined  RECTAL: refused  SKIN: no rash or abnormal lesions  MS: normal gait, moves all extremities normally  NEURO: CN II-XII grossly intact, normal muscle strength and sensation to light touch on extremities  PSYCH: normal affect, pleasant and cooperative  ASSESSMENT AND PLAN:  Discussed the following assessment and plan:  Anemia, unspecified anemia type - Plan: CBC with Differential  Essential hypertension  BMI 32.0-32.9,adult - Plan: Lipid panel, Hemoglobin A1c  Environmental allergies  Visit for preventive health examination - Plan: Basic metabolic panel   -Discussed and advised all [place] preventive services health task force level A and B recommendations for age, sex and risks.  -Advised at least 150 minutes of exercise per week and a healthy diet low in saturated fats and sweets and consisting of fresh fruits and vegetables, lean meats such as fish and white chicken and whole grains.  -FASTING labs, studies and vaccines per orders this encounter  Orders Placed This Encounter  Procedures  . Basic metabolic panel  . Lipid panel  . Hemoglobin A1c  . CBC with Differential    Patient advised to return to clinic immediately if symptoms worsen or persist or new concerns.  Patient Instructions  BEFORE YOU LEAVE: -Tdap -labs  -We have ordered labs or studies at this visit. It can take up to 1-2 weeks for results and processing. We will contact you with instructions IF your results are abnormal. Normal results will be released to your [facility]. If you have not heard from [place] or can not find your results in [facility] in 2 weeks please contact our office.  For the allergies: -take the zyxal daily and flonase 2 sprays each nostril daily for 1 month, then 1 spray  each nostril daily -stop the musinex   We recommend the following healthy lifestyle measures: - eat a healthy diet consisting of lots of vegetables, fruits, beans, nuts, seeds, healthy meats such as white chicken and fish and whole grains.  - avoid fried foods, fast food, processed foods, sodas, red meet and other fattening foods.  - get a least 150 minutes of aerobic exercise per week.           Return in about 6 months (around 08/08/2015) for follow up.  Kriste Basque R.

## 2015-02-06 NOTE — Progress Notes (Signed)
Pre visit review using our clinic review tool, if applicable. No additional management support is needed unless otherwise documented below in the visit note. 

## 2015-02-07 LAB — CBC WITH DIFFERENTIAL/PLATELET
BASOS ABS: 0.1 10*3/uL (ref 0.0–0.1)
Basophils Relative: 0.9 % (ref 0.0–3.0)
Eosinophils Absolute: 0 10*3/uL (ref 0.0–0.7)
Eosinophils Relative: 0 % (ref 0.0–5.0)
HEMATOCRIT: 36.1 % (ref 36.0–46.0)
HEMOGLOBIN: 11.9 g/dL — AB (ref 12.0–15.0)
LYMPHS ABS: 2.1 10*3/uL (ref 0.7–4.0)
Lymphocytes Relative: 32.6 % (ref 12.0–46.0)
MCHC: 33 g/dL (ref 30.0–36.0)
MCV: 87.8 fl (ref 78.0–100.0)
MONO ABS: 0.4 10*3/uL (ref 0.1–1.0)
MONOS PCT: 6 % (ref 3.0–12.0)
NEUTROS ABS: 3.9 10*3/uL (ref 1.4–7.7)
Neutrophils Relative %: 60.5 % (ref 43.0–77.0)
Platelets: 286 10*3/uL (ref 150.0–400.0)
RBC: 4.11 Mil/uL (ref 3.87–5.11)
RDW: 13.3 % (ref 11.5–15.5)
WBC: 6.4 10*3/uL (ref 4.0–10.5)

## 2015-02-07 LAB — BASIC METABOLIC PANEL
BUN: 12 mg/dL (ref 6–23)
CALCIUM: 10.1 mg/dL (ref 8.4–10.5)
CO2: 31 meq/L (ref 19–32)
Chloride: 102 mEq/L (ref 96–112)
Creatinine, Ser: 0.81 mg/dL (ref 0.40–1.20)
GFR: 91.69 mL/min (ref >60.00)
Glucose, Bld: 89 mg/dL (ref 70–99)
Potassium: 4.3 mEq/L (ref 3.5–5.1)
Sodium: 139 mEq/L (ref 135–145)

## 2015-02-07 LAB — LIPID PANEL
Cholesterol: 175 mg/dL (ref 0–200)
HDL: 71.9 mg/dL (ref >39.00)
LDL Cholesterol: 92 mg/dL (ref 0–99)
NonHDL: 103.1
TRIGLYCERIDES: 57 mg/dL (ref 0.0–149.0)
Total CHOL/HDL Ratio: 2
VLDL: 11.4 mg/dL (ref 0.0–40.0)

## 2015-02-07 LAB — HEMOGLOBIN A1C: Hgb A1c MFr Bld: 5.2 % (ref 4.6–6.5)

## 2015-03-10 ENCOUNTER — Encounter: Payer: Self-pay | Admitting: *Deleted

## 2015-03-10 ENCOUNTER — Other Ambulatory Visit: Payer: Self-pay | Admitting: Family Medicine

## 2015-03-11 NOTE — Telephone Encounter (Signed)
Pt had last mammogram in oct 2015 at dr Henderson Cloudhorvath

## 2015-04-09 ENCOUNTER — Other Ambulatory Visit: Payer: Self-pay | Admitting: Family Medicine

## 2015-05-08 ENCOUNTER — Other Ambulatory Visit: Payer: Self-pay | Admitting: Family Medicine

## 2015-06-07 ENCOUNTER — Other Ambulatory Visit: Payer: Self-pay | Admitting: Family Medicine

## 2015-07-16 ENCOUNTER — Telehealth: Payer: Self-pay | Admitting: Family Medicine

## 2015-07-16 DIAGNOSIS — M1990 Unspecified osteoarthritis, unspecified site: Secondary | ICD-10-CM

## 2015-07-16 NOTE — Telephone Encounter (Signed)
ok 

## 2015-07-16 NOTE — Telephone Encounter (Signed)
Pt states she has very bad arthritis. Pt used to be on mobic. When she took the mobic (from Dr Kriste Basque) it was not as bad. Pt states in the past pt had referral for a rheumatologist. But pt did not go.  Now that she has stopped the mobic for this extended period, pt has bad pain in her left hip. Pt knows it is arthritis. Pt would like to know if dr Selena Batten could refer her to  Dr Dierdre Forth,  Address: [address] [place], [place] [address]  Phone: [phone] Fax [phone]   Leave message on home phone only if pt cannot be reached on either work or cell.

## 2015-07-16 NOTE — Telephone Encounter (Signed)
Order placed and I left a detailed message at with this information at the pts home number.

## 2015-08-12 ENCOUNTER — Ambulatory Visit (INDEPENDENT_AMBULATORY_CARE_PROVIDER_SITE_OTHER): Payer: BC Managed Care – PPO | Admitting: Family Medicine

## 2015-08-12 ENCOUNTER — Encounter: Payer: Self-pay | Admitting: Family Medicine

## 2015-08-12 VITALS — BP 122/82 | HR 83 | Temp 97.9°F | Ht 63.25 in | Wt 179.0 lb

## 2015-08-12 DIAGNOSIS — M159 Polyosteoarthritis, unspecified: Secondary | ICD-10-CM

## 2015-08-12 DIAGNOSIS — J302 Other seasonal allergic rhinitis: Secondary | ICD-10-CM | POA: Diagnosis not present

## 2015-08-12 DIAGNOSIS — I10 Essential (primary) hypertension: Secondary | ICD-10-CM | POA: Diagnosis not present

## 2015-08-12 DIAGNOSIS — D649 Anemia, unspecified: Secondary | ICD-10-CM

## 2015-08-12 DIAGNOSIS — E669 Obesity, unspecified: Secondary | ICD-10-CM

## 2015-08-12 NOTE — Progress Notes (Signed)
HPI:  HTN: -meds: amlodipine-benazepril 5-10mg  -denies: CP, DOE, swelling  Obesity: -she is walking 1 hour 3 days per week - but not as good -she has a fit bit - trying to do 10000 per day -diet: is so so  Iron def anemia: -reports she has had this her whole life and take iron -denies dizziness, palpitations, weakness, melena, hematochezia  Allergic Rhinitis: -chronic PND, sinus issues -hx allergies and had allergy shots in the past  OA: -seeing Dr. Dierdre Forth -reports no RA and taking limbrel  ROS: See pertinent positives and negatives per HPI.  Past Medical History  Diagnosis Date  . URI (upper respiratory infection)   . Allergic rhinitis   . Hypertension   . UTI (lower urinary tract infection)   . DJD (degenerative joint disease)     Sees Dr. Dierdre Forth  . Headache(784.0)   . Anxiety   . Mild anemia     iron def, reports on iron her whole life  . Obesity     No past surgical history on file.  No family history on file.  Social History   Social History  . Marital Status: Married    Spouse Name: N/A  . Number of Children: N/A  . Years of Education: N/A   Occupational History  . retired    Social History Main Topics  . Smoking status: Never Smoker   . Smokeless tobacco: None  . Alcohol Use: Yes     Comment: social use  . Drug Use: None  . Sexual Activity: Not Asked   Other Topics Concern  . None   Social History Narrative   Work or School: works at [employer] and T at [employer] Situation: lives with husband      Spiritual Beliefs: methodist      Lifestyle: walks; trying to eat a healthy diet              Current outpatient prescriptions:  .  amLODipine-benazepril (LOTREL) 5-10 MG per capsule, Take 1 capsule by mouth daily., Disp: 90 capsule, Rfl: 3 .  aspirin 81 MG tablet, Take 81 mg by mouth daily., Disp: , Rfl:  .  calcium carbonate (OS-CAL) 600 MG TABS, Take 600 mg by mouth daily., Disp: , Rfl:  .  cholecalciferol (VITAMIN D) 1000  UNITS tablet, Take 1,000 Units by mouth daily., Disp: , Rfl:  .  ferrous sulfate 325 (65 FE) MG tablet, Take 325 mg by mouth daily with breakfast., Disp: , Rfl:  .  fish oil-omega-3 fatty acids 1000 MG capsule, Take 2 g by mouth daily., Disp: , Rfl:  .  Flavocoxid (LIMBREL) 500 MG CAPS, Take by mouth. Per patient given by Dr Dierdre Forth for knee OA, Disp: , Rfl:  .  fluticasone (FLONASE) 50 MCG/ACT nasal spray, Place 2 sprays into both nostrils daily., Disp: 16 g, Rfl: 6 .  guaiFENesin (MUCINEX) 600 MG 12 hr tablet, TAKE 1-2 TABLETS BY MOUTH TWICE DAILY AS NEEDED, Disp: 120 tablet, Rfl: 1 .  levocetirizine (XYZAL) 2.5 MG/5ML solution, Take 5 mLs (2.5 mg total) by mouth every evening., Disp: 148 mL, Rfl: 12 .  Multiple Vitamins-Minerals (CENTRUM SILVER ULTRA WOMENS) TABS, TAKE 1 TABLET BY MOUTH EVERY DAY, Disp: 200 tablet, Rfl: 0 .  psyllium (METAMUCIL) 58.6 % powder, Take 1 packet by mouth daily., Disp: , Rfl:   EXAM:  Filed Vitals:   08/12/15 1607  BP: 122/82  Pulse: 83  Temp: 97.9 F (36.6 C)  Body mass index is 31.44 kg/(m^2).  GENERAL: vitals reviewed and listed above, alert, oriented, appears well hydrated and in no acute distress  HEENT: atraumatic, conjunttiva clear, no obvious abnormalities on inspection of external nose and ears  NECK: no obvious masses on inspection  LUNGS: clear to auscultation bilaterally, no wheezes, rales or rhonchi, good air movement  CV: HRRR, no peripheral edema  MS: moves all extremities without noticeable abnormality  PSYCH: pleasant and cooperative, no obvious depression or anxiety  ASSESSMENT AND PLAN:  Discussed the following assessment and plan:  Essential hypertension -good on recheck, cont current tx  Obesity -lifestyle recs  Other seasonal allergic rhinitis -stable  Anemia, unspecified anemia type -stable  Osteoarthritis of multiple joints, unspecified osteoarthritis type -discussed various tx options and risks, seein gDr.  Dierdre ForthBeekman and is trying Limbrel  -Patient advised to return or notify a doctor immediately if symptoms worsen or persist or new concerns arise.  Patient Instructions  BEFORE YOU LEAVE: -Ronnald CollumJo Anne, can you get permission to obtain last pap report from gyn -CPE in 6 months  We recommend the following healthy lifestyle measures: - eat a healthy whole foods diet consisting of regular small meals composed of vegetables, fruits, beans, nuts, seeds, healthy meats such as white chicken and fish and whole grains.  - avoid sweets, white starchy foods, fried foods, fast food, processed foods, sodas, red meet and other fattening foods.  - get a least 150-300 minutes of aerobic exercise per week.       Kriste BasqueKIM, Deidrick Rainey R.

## 2015-08-12 NOTE — Progress Notes (Signed)
Pre visit review using our clinic review tool, if applicable. No additional management support is needed unless otherwise documented below in the visit note. 

## 2015-08-12 NOTE — Patient Instructions (Signed)
BEFORE YOU LEAVE: -Ronnald CollumJo Anne, can you get permission to obtain last pap report from gyn -CPE in 6 months  We recommend the following healthy lifestyle measures: - eat a healthy whole foods diet consisting of regular small meals composed of vegetables, fruits, beans, nuts, seeds, healthy meats such as white chicken and fish and whole grains.  - avoid sweets, white starchy foods, fried foods, fast food, processed foods, sodas, red meet and other fattening foods.  - get a least 150-300 minutes of aerobic exercise per week.

## 2015-08-14 ENCOUNTER — Encounter: Payer: Self-pay | Admitting: Family Medicine

## 2015-08-28 ENCOUNTER — Telehealth: Payer: Self-pay | Admitting: Family Medicine

## 2015-08-28 LAB — HM PAP SMEAR: HM Pap smear: NEGATIVE

## 2015-08-28 LAB — HM MAMMOGRAPHY

## 2015-08-28 MED ORDER — MELOXICAM 7.5 MG PO TABS: 7.5000 mg | ORAL_TABLET | Freq: Every day | ORAL | Status: DC

## 2015-08-28 NOTE — Telephone Encounter (Signed)
As we discussed, I would not recommend mobic for regular use. She should discuss with her rheumatologist. Ok to send mobic 7.5mg  # 30 in interim. No refills. But advise to use only on bad days and see her rheumatologist or follow up here regarding long term options. Thanks.

## 2015-08-28 NOTE — Addendum Note (Signed)
Addended by: Johnella MoloneyFUNDERBURK, JO A on: 08/28/2015 03:58 PM   Modules accepted: Orders

## 2015-08-28 NOTE — Telephone Encounter (Signed)
I called the pt and informed her of the message below and she is aware the Rx was sent to her pharmacy. 

## 2015-08-28 NOTE — Telephone Encounter (Signed)
Pt went to gyn and gyn md said it is ok for her to taking generic mobic 15 mg #30 w/refills . Pt is having joint pain. [facility] This med was originally prescribed by dr Kriste Basquenadel

## 2015-11-23 ENCOUNTER — Other Ambulatory Visit: Payer: Self-pay | Admitting: Family Medicine

## 2016-02-11 ENCOUNTER — Encounter: Payer: BC Managed Care – PPO | Admitting: Family Medicine

## 2016-02-21 ENCOUNTER — Other Ambulatory Visit: Payer: Self-pay | Admitting: Family Medicine

## 2016-03-02 ENCOUNTER — Encounter: Payer: Self-pay | Admitting: Internal Medicine

## 2016-03-11 ENCOUNTER — Encounter: Payer: Self-pay | Admitting: Family Medicine

## 2016-03-11 ENCOUNTER — Ambulatory Visit (INDEPENDENT_AMBULATORY_CARE_PROVIDER_SITE_OTHER): Payer: BC Managed Care – PPO | Admitting: Family Medicine

## 2016-03-11 VITALS — BP 120/90 | HR 71 | Temp 98.9°F | Ht 63.25 in | Wt 175.3 lb

## 2016-03-11 DIAGNOSIS — Z0000 Encounter for general adult medical examination without abnormal findings: Secondary | ICD-10-CM

## 2016-03-11 DIAGNOSIS — Z1211 Encounter for screening for malignant neoplasm of colon: Secondary | ICD-10-CM

## 2016-03-11 DIAGNOSIS — E669 Obesity, unspecified: Secondary | ICD-10-CM

## 2016-03-11 DIAGNOSIS — I10 Essential (primary) hypertension: Secondary | ICD-10-CM

## 2016-03-11 DIAGNOSIS — M159 Polyosteoarthritis, unspecified: Secondary | ICD-10-CM

## 2016-03-11 LAB — BASIC METABOLIC PANEL
BUN: 12 mg/dL (ref 6–23)
CALCIUM: 9.7 mg/dL (ref 8.4–10.5)
CHLORIDE: 104 meq/L (ref 96–112)
CO2: 30 meq/L (ref 19–32)
CREATININE: 0.75 mg/dL (ref 0.40–1.20)
GFR: 99.86 mL/min (ref >60.00)
Glucose, Bld: 85 mg/dL (ref 70–99)
Potassium: 4.4 mEq/L (ref 3.5–5.1)
Sodium: 140 mEq/L (ref 135–145)

## 2016-03-11 LAB — CBC
HEMATOCRIT: 37 % (ref 36.0–46.0)
HEMOGLOBIN: 11.9 g/dL — AB (ref 12.0–15.0)
MCHC: 32.3 g/dL (ref 30.0–36.0)
MCV: 89.6 fl (ref 78.0–100.0)
PLATELETS: 299 10*3/uL (ref 150.0–400.0)
RBC: 4.13 Mil/uL (ref 3.87–5.11)
RDW: 13.6 % (ref 11.5–15.5)
WBC: 5.6 10*3/uL (ref 4.0–10.5)

## 2016-03-11 LAB — LIPID PANEL
CHOL/HDL RATIO: 3
Cholesterol: 165 mg/dL (ref 0–200)
HDL: 63.6 mg/dL (ref >39.00)
LDL Cholesterol: 89 mg/dL (ref 0–99)
NONHDL: 101.71
Triglycerides: 62 mg/dL (ref 0.0–149.0)
VLDL: 12.4 mg/dL (ref 0.0–40.0)

## 2016-03-11 LAB — HEMOGLOBIN A1C: Hgb A1c MFr Bld: 5.5 % (ref 4.6–6.5)

## 2016-03-11 NOTE — Patient Instructions (Addendum)
BEFORE YOU LEAVE: -permission/obtain gyn records of last pap and mammogram -labs -follow in 4-6 months  -We placed a referral for you as discussed for the colonoscopy. It usually takes about 1-2 weeks to process and schedule this referral. If you have not heard from us regarding this appointment in 2 weeks please contact our office.  We recommend the following healthy lifestyle measures: - eat a healthy whole foods diet consisting of regular small meals composed of vegetables, fruits, beans, nuts, seeds, healthy meats such as white chicken and fish and whole grains.  - avoid sweets, white starchy foods, fried foods, fast food, processed foods, sodas, red meet and other fattening foods.  - get a least 150-300 minutes of aerobic exercise per week.   We have ordered labs or studies at this visit. It can take up to 1-2 weeks for results and processing. IF results require follow up or explanation, we will call you with instructions. Clinically stable results will be released to your [facility]. If you have not heard from us or cannot find your results in [facility] in 2 weeks please contact our office at [phone].  If you are not yet signed up for [facility], please consider signing up.

## 2016-03-11 NOTE — Progress Notes (Signed)
Pre visit review using our clinic review tool, if applicable. No additional management support is needed unless otherwise documented below in the visit note. 

## 2016-03-11 NOTE — Progress Notes (Signed)
HPI:  Here for CPE:  -Concerns and/or follow up today:   HTN: -meds: amlodipine-benazepril 5-10mg  -she did not take her BP medication because she was fasting, did not drink anything either -denies: CP, DOE, swelling  Obesity: -she is walking 1 hour 3 days per week - but not as good -she has a fit bit - trying to do 10000 per day -diet: is so so  Iron def anemia: -reports she has had this her whole life and take iron -denies dizziness, palpitations, weakness, melena, hematochezia  Allergic Rhinitis: -chronic PND, sinus issues -hx allergies and had allergy shots in the past  OA: -seeing Dr. Dierdre Forth -reports no RA and taking limbrel  -Diet: variety of foods, balance and well rounded  -Exercise: regular exercise - few times per week  -Taking folic acid, vitamin D or calcium: yes  -Diabetes and Dyslipidemia Screening: FASTING  -Vaccines: UTD  -pap history: sees gyn, last pap in records we have 07/2012 with [facility] - she reports she had her pap and mammogram 07/2015 per patient   -FDLMP:n/a  -wants STI testing (Hep C if born [birth year]): no  -FH breast, colon or ovarian ca: see FH Last mammogram: sees gyn - report does yearly with gyn Last colon cancer screening: 02/2006 normal colonoscopy; discussed options and she wants to do a colonoscopy  Breast Ca Risk Assessment: -sees gyn   -Alcohol, Tobacco, drug use: see social history  Review of Systems - no fevers, unintentional weight loss, vision loss, hearing loss, chest pain, sob, hemoptysis, melena, hematochezia, hematuria, genital discharge, changing or concerning skin lesions, bleeding, bruising, loc, thoughts of self harm or SI  Past Medical History  Diagnosis Date  . URI (upper respiratory infection)   . Allergic rhinitis   . Hypertension   . UTI (lower urinary tract infection)   . DJD (degenerative joint disease)     Sees Dr. Dierdre Forth  . Headache(784.0)   . Anxiety   . Mild anemia     iron  def, reports on iron her whole life  . Obesity     No past surgical history on file.  No family history on file.  Social History   Social History  . Marital Status: Married    Spouse Name: N/A  . Number of Children: N/A  . Years of Education: N/A   Occupational History  . retired    Social History Main Topics  . Smoking status: Never Smoker   . Smokeless tobacco: None  . Alcohol Use: Yes     Comment: social use  . Drug Use: None  . Sexual Activity: Not Asked   Other Topics Concern  . None   Social History Narrative   Work or School: works at [employer] and T at [employer] Situation: lives with husband      Spiritual Beliefs: methodist      Lifestyle: walks; trying to eat a healthy diet              Current outpatient prescriptions:  .  amLODipine-benazepril (LOTREL) 5-10 MG capsule, TAKE 1 CAPSULE BY MOUTH DAILY, Disp: 90 capsule, Rfl: 1 .  aspirin 81 MG tablet, Take 81 mg by mouth daily., Disp: , Rfl:  .  calcium carbonate (OS-CAL) 600 MG TABS, Take 600 mg by mouth daily., Disp: , Rfl:  .  cholecalciferol (VITAMIN D) 1000 UNITS tablet, Take 1,000 Units by mouth daily., Disp: , Rfl:  .  ferrous sulfate 325 (65 FE) MG  tablet, Take 325 mg by mouth daily with breakfast., Disp: , Rfl:  .  fish oil-omega-3 fatty acids 1000 MG capsule, Take 2 g by mouth daily., Disp: , Rfl:  .  Flavocoxid (LIMBREL) 500 MG CAPS, Take by mouth. Per patient given by Dr Dierdre Forth for knee OA, Disp: , Rfl:  .  fluticasone (FLONASE) 50 MCG/ACT nasal spray, Place 2 sprays into both nostrils daily., Disp: 16 g, Rfl: 6 .  guaiFENesin (MUCINEX) 600 MG 12 hr tablet, TAKE 1-2 TABLETS BY MOUTH TWICE DAILY AS NEEDED, Disp: 120 tablet, Rfl: 1 .  guaiFENesin (MUCINEX) 600 MG 12 hr tablet, TAKE 1-2 TABLETS BY MOUTH TWICE DAILY AS NEEDED, Disp: 120 tablet, Rfl: 0 .  levocetirizine (XYZAL) 2.5 MG/5ML solution, Take 5 mLs (2.5 mg total) by mouth every evening., Disp: 148 mL, Rfl: 12 .  Multiple  Vitamins-Minerals (CENTRUM SILVER ULTRA WOMENS) TABS, TAKE 1 TABLET BY MOUTH EVERY DAY, Disp: 200 tablet, Rfl: 0 .  psyllium (METAMUCIL) 58.6 % powder, Take 1 packet by mouth daily., Disp: , Rfl:   EXAM:  Filed Vitals:   03/11/16 0732  BP: 120/90  Pulse: 71  Temp: 98.9 F (37.2 C)    GENERAL: vitals reviewed and listed below, alert, oriented, appears well hydrated and in no acute distress  HEENT: head atraumatic, PERRLA, normal appearance of eyes, ears, nose and mouth. moist mucus membranes.  NECK: supple, no masses or lymphadenopathy  LUNGS: clear to auscultation bilaterally, no rales, rhonchi or wheeze  CV: HRRR, no peripheral edema or cyanosis, normal pedal pulses  BREAST:  Refused, does with gyn  ABDOMEN: bowel sounds normal, soft, non tender to palpation, no masses, no rebound or guarding  GU: refused, does with gyn  SKIN: no rash or abnormal lesions, refused full skin exam, agreed to let me check her back  MS: normal gait, moves all extremities normally  NEURO: CN II-XII grossly intact, normal muscle strength and sensation to light touch on extremities  PSYCH: normal affect, pleasant and cooperative  ASSESSMENT AND PLAN:  Discussed the following assessment and plan:  Visit for preventive health examination - Plan: Lipid Panel, Hemoglobin A1c  Colon cancer screening - Plan: Ambulatory referral to Gastroenterology  Essential hypertension - Plan: CBC (no diff), Basic metabolic panel -did not take medication this morning  Osteoarthritis of multiple joints, unspecified osteoarthritis type  Obesity   -Discussed and advised all [place] preventive services health task force level A and B recommendations for age, sex and risks.  -Advised at least 150 minutes of exercise per week and a healthy diet low in saturated fats and sweets and consisting of fresh fruits and vegetables, lean meats such as fish and white chicken and whole grains.  -labs, studies and vaccines per  orders this encounter  Orders Placed This Encounter  Procedures  . Lipid Panel  . CBC (no diff)  . Basic metabolic panel  . Hemoglobin A1c  . Ambulatory referral to Gastroenterology    Referral Priority:  Routine    Referral Type:  Consultation    Referral Reason:  Specialty Services Required    Number of Visits Requested:  1    Patient advised to return to clinic immediately if symptoms worsen or persist or new concerns.  Patient Instructions  BEFORE YOU LEAVE: -permission/obtain gyn records of last pap and mammogram -labs -follow in 4 months  -We placed a referral for you as discussed for the colonoscopy. It usually takes about 1-2 weeks to process and schedule this referral.  If you have not heard from us regarding this appointment in 2 weeks please contact our office.  We recommend the following healthy lifestyle measures: - eat a healthy whole foods diet consisting of regular small meals composed of vegetables, fruits, beans, nuts, seeds, healthy meats such as white chicken and fish and whole grains.  - avoid sweets, white starchy foods, fried foods, fast food, processed foods, sodas, red meet and other fattening foods.  - get a least 150-300 minutes of aerobic exercise per week.   We have ordered labs or studies at this visit. It can take up to 1-2 weeks for results and processing. IF results require follow up or explanation, we will call you with instructions. Clinically stable results will be released to your [facility]. If you have not heard from us or cannot find your results in [facility] in 2 weeks please contact our office at [phone].  If you are not yet signed up for [facility], please consider signing up.              No Follow-up on file.  Kriste BasqueKIM, Jamear Carbonneau R.

## 2016-03-12 ENCOUNTER — Encounter: Payer: Self-pay | Admitting: Internal Medicine

## 2016-04-28 ENCOUNTER — Ambulatory Visit (AMBULATORY_SURGERY_CENTER): Payer: Self-pay

## 2016-04-28 VITALS — Ht 64.0 in | Wt 178.4 lb

## 2016-04-28 DIAGNOSIS — Z1211 Encounter for screening for malignant neoplasm of colon: Secondary | ICD-10-CM

## 2016-04-28 MED ORDER — SUPREP BOWEL PREP KIT 17.5-3.13-1.6 GM/177ML PO SOLN: 1.0000 | Freq: Once | ORAL | Status: DC

## 2016-04-28 NOTE — Progress Notes (Signed)
No allergies to eggs or soy No diet meds No home oxygen No past problems with anesthesia  Has email and internet; declined emmi 

## 2016-04-29 ENCOUNTER — Encounter: Payer: Self-pay | Admitting: Internal Medicine

## 2016-04-29 ENCOUNTER — Telehealth: Payer: Self-pay | Admitting: Family Medicine

## 2016-04-29 NOTE — Telephone Encounter (Signed)
I called the pt and informed her of the message below.  She stated she is no longer taking Limbrel.

## 2016-04-29 NOTE — Telephone Encounter (Signed)
Pt would like to have something for inflammation that is in her knees.  She will be going to see Dr. Gerri SporeAluiso  06/24/2016.  Pt is unable to walk.   Pharm: [facility] [address].

## 2016-04-29 NOTE — Telephone Encounter (Signed)
Usually antiinflammatory meds work best for this type of pain. However, they do have risks and she is taking Limbrel and I do not know enough about limbrel to know if you can use safely with and antiin-flammatory. Could try topical casaicin, tylenol per dosing guidelines on bottle, heat and staing active until sees specialist. If not taking limbrel or other anti-inflammatory could try aleve per instructions on bottle only as needed for pain. Thanks.

## 2016-05-12 ENCOUNTER — Ambulatory Visit (AMBULATORY_SURGERY_CENTER): Payer: BC Managed Care – PPO | Admitting: Internal Medicine

## 2016-05-12 ENCOUNTER — Encounter: Payer: Self-pay | Admitting: Internal Medicine

## 2016-05-12 VITALS — BP 134/73 | HR 75 | Temp 97.7°F | Resp 11 | Ht 63.0 in | Wt 175.0 lb

## 2016-05-12 DIAGNOSIS — Z1211 Encounter for screening for malignant neoplasm of colon: Secondary | ICD-10-CM | POA: Diagnosis present

## 2016-05-12 NOTE — Patient Instructions (Signed)
Diverticulosis seen today, handout given today. Repeat colonoscopy in 10 years. Resume current medications. Call us with any questions or concerns. Thank you!  YOU HAD AN ENDOSCOPIC PROCEDURE TODAY AT THE [facility]:   Refer to the procedure report that was given to you for any specific questions about what was found during the examination.  If the procedure report does not answer your questions, please call your gastroenterologist to clarify.  If you requested that your care partner not be given the details of your procedure findings, then the procedure report has been included in a sealed envelope for you to review at your convenience later.  YOU SHOULD EXPECT: Some feelings of bloating in the abdomen. Passage of more gas than usual.  Walking can help get rid of the air that was put into your GI tract during the procedure and reduce the bloating. If you had a lower endoscopy (such as a colonoscopy or flexible sigmoidoscopy) you may notice spotting of blood in your stool or on the toilet paper. If you underwent a bowel prep for your procedure, you may not have a normal bowel movement for a few days.  Please Note:  You might notice some irritation and congestion in your nose or some drainage.  This is from the oxygen used during your procedure.  There is no need for concern and it should clear up in a day or so.  SYMPTOMS TO REPORT IMMEDIATELY:   Following lower endoscopy (colonoscopy or flexible sigmoidoscopy):  Excessive amounts of blood in the stool  Significant tenderness or worsening of abdominal pains  Swelling of the abdomen that is new, acute  Fever of 100F or higher   For urgent or emergent issues, a gastroenterologist can be reached at any hour by calling [phone].   DIET: Your first meal following the procedure should be a small meal and then it is ok to progress to your normal diet. Heavy or fried foods are harder to digest and may make you feel nauseous or  bloated.  Likewise, meals heavy in dairy and vegetables can increase bloating.  Drink plenty of fluids but you should avoid alcoholic beverages for 24 hours.  ACTIVITY:  You should plan to take it easy for the rest of today and you should NOT DRIVE or use heavy machinery until tomorrow (because of the sedation medicines used during the test).    FOLLOW UP: Our staff will call the number listed on your records the next business day following your procedure to check on you and address any questions or concerns that you may have regarding the information given to you following your procedure. If we do not reach you, we will leave a message.  However, if you are feeling well and you are not experiencing any problems, there is no need to return our call.  We will assume that you have returned to your regular daily activities without incident.  If any biopsies were taken you will be contacted by phone or by letter within the next 1-3 weeks.  Please call us at [phone] if you have not heard about the biopsies in 3 weeks.    SIGNATURES/CONFIDENTIALITY: You and/or your care partner have signed paperwork which will be entered into your electronic medical record.  These signatures attest to the fact that that the information above on your After Visit Summary has been reviewed and is understood.  Full responsibility of the confidentiality of this discharge information lies with you and/or your care-partner.

## 2016-05-12 NOTE — Op Note (Signed)
[facility] Patient Name: Paula Kelley Procedure Date: 05/12/2016 8:49 AM MRN: [id number] Endoscopist: Wilhemina Bonito. Marina Goodell , MD Age: 65 Referring MD:  Date of Birth: [birth year] Gender: Female Account #: [id number] Procedure:                Colonoscopy Indications:              Screening for colorectal malignant neoplasm.                            Negative index exam 2007 Medicines:                Monitored Anesthesia Care Procedure:                Pre-Anesthesia Assessment:                           - Prior to the procedure, a History and Physical                            was performed, and patient medications and                            allergies were reviewed. The patient's tolerance of                            previous anesthesia was also reviewed. The risks                            and benefits of the procedure and the sedation                            options and risks were discussed with the patient.                            All questions were answered, and informed consent                            was obtained. Prior Anticoagulants: The patient has                            taken no previous anticoagulant or antiplatelet                            agents. ASA Grade Assessment: II - A patient with                            mild systemic disease. After reviewing the risks                            and benefits, the patient was deemed in                            satisfactory condition to undergo the procedure.  After obtaining informed consent, the colonoscope                            was passed under direct vision. Throughout the                            procedure, the patient's blood pressure, pulse, and                            oxygen saturations were monitored continuously. The                            Model CF-[id number]) scope was introduced                            through the anus and advanced to the the  cecum,                            identified by appendiceal orifice and ileocecal                            valve. The ileocecal valve, appendiceal orifice,                            and rectum were photographed. The quality of the                            bowel preparation was excellent. The colonoscopy                            was performed without difficulty. The patient                            tolerated the procedure well. The bowel preparation                            used was SUPREP. Scope In: 8:58:55 AM Scope Out: 9:12:14 AM Scope Withdrawal Time: 0 hours 9 minutes 30 seconds  Total Procedure Duration: 0 hours 13 minutes 19 seconds  Findings:                 Multiple diverticula were found in the sigmoid                            colon.                           The exam was otherwise without abnormality on                            direct and retroflexion views. Complications:            No immediate complications. Estimated blood loss:                            None. Estimated Blood  Loss:     Estimated blood loss: none. Impression:               - Diverticulosis in the sigmoid colon.                           - The examination was otherwise normal on direct                            and retroflexion views.                           - No specimens collected. Recommendation:           - Repeat colonoscopy in 10 years for screening                            purposes.                           - Patient has a contact number available for                            emergencies. The signs and symptoms of potential                            delayed complications were discussed with the                            patient. Return to normal activities tomorrow.                            Written discharge instructions were provided to the                            patient.                           - Resume previous diet.                           - Continue present  medications. Wilhemina Bonito. Marina Goodell, MD 05/12/2016 9:15:25 AM This report has been signed electronically.

## 2016-05-12 NOTE — Progress Notes (Signed)
A/ox3 pleased with MAC, report to Robbin RN 

## 2016-05-13 ENCOUNTER — Telehealth: Payer: Self-pay

## 2016-05-13 NOTE — Telephone Encounter (Signed)
  Follow up Call-  Call back number 05/12/2016  Post procedure Call Back phone  # [phone]  Permission to leave phone message Yes  Some recent data might be hidden     Patient questions:  Do you have a fever, pain , or abdominal swelling? No. Pain Score  0 *  Have you tolerated food without any problems? Yes.    Have you been able to return to your normal activities? Yes.    Do you have any questions about your discharge instructions: Diet   No. Medications  No. Follow up visit  No.  Do you have questions or concerns about your Care? No.  Actions: * If pain score is 4 or above: No action needed, pain <4.

## 2016-05-14 ENCOUNTER — Telehealth: Payer: Self-pay | Admitting: Family Medicine

## 2016-05-14 NOTE — Telephone Encounter (Signed)
Pt brought chair lift due to knee pain and needs on rx pad chair lift. Pt will pick up rx

## 2016-05-14 NOTE — Telephone Encounter (Signed)
Please advise 

## 2016-05-16 NOTE — Telephone Encounter (Signed)
This should come from her orthopedic doc. Thanks.

## 2016-05-17 NOTE — Telephone Encounter (Signed)
I called the pt and informed her of the message below

## 2016-08-13 ENCOUNTER — Ambulatory Visit: Payer: Self-pay | Admitting: Orthopedic Surgery

## 2016-08-18 ENCOUNTER — Other Ambulatory Visit: Payer: Self-pay | Admitting: Family Medicine

## 2016-09-02 ENCOUNTER — Ambulatory Visit (INDEPENDENT_AMBULATORY_CARE_PROVIDER_SITE_OTHER): Payer: BC Managed Care – PPO | Admitting: Family Medicine

## 2016-09-02 ENCOUNTER — Encounter: Payer: Self-pay | Admitting: Family Medicine

## 2016-09-02 VITALS — BP 120/84 | HR 79 | Temp 98.1°F | Ht 63.0 in | Wt 175.0 lb

## 2016-09-02 DIAGNOSIS — D649 Anemia, unspecified: Secondary | ICD-10-CM | POA: Diagnosis not present

## 2016-09-02 DIAGNOSIS — Z23 Encounter for immunization: Secondary | ICD-10-CM

## 2016-09-02 DIAGNOSIS — M159 Polyosteoarthritis, unspecified: Secondary | ICD-10-CM | POA: Diagnosis not present

## 2016-09-02 DIAGNOSIS — I10 Essential (primary) hypertension: Secondary | ICD-10-CM | POA: Diagnosis not present

## 2016-09-02 DIAGNOSIS — Z6831 Body mass index (BMI) 31.0-31.9, adult: Secondary | ICD-10-CM | POA: Diagnosis not present

## 2016-09-02 NOTE — Patient Instructions (Addendum)
BEFORE YOU LEAVE: -prevnar 13 -labs -follow up: 3 months  Call the number provided to set up your bone density test.  We have ordered labs or studies at this visit. It can take up to 1-2 weeks for results and processing. IF results require follow up or explanation, we will call you with instructions. Clinically stable results will be released to your [facility]. If you have not heard from us or cannot find your results in [facility] in 2 weeks please contact our office at [phone].  If you are not yet signed up for [facility], please consider signing up.   We recommend the following healthy lifestyle for LIFE: 1) Small portions.   Tip: eat off of a salad plate instead of a dinner plate.  Tip: It is ok to feel hungry after a meal of proper portion sizes  Tip: if you need more or a snack choose fruits, veggies and/or a handful of nuts or seeds.  2) Eat a healthy clean diet.  * Tip: Avoid (less then 1 serving per week): processed foods, sweets, sweetened drinks, white starches (rice, flour, bread, potatoes, pasta, etc), red meat, fast foods, butter  *Tip: CHOOSE instead   * 5-9 servings per day of fresh or frozen fruits and vegetables (but not corn, potatoes, bananas, canned or dried fruit)   *nuts and seeds, beans   *olives and olive oil   *small portions of lean meats such as fish and white chicken    *small portions of whole grains  3)Get at least 150 minutes of sweaty aerobic exercise per week.  4)Reduce stress - consider counseling, meditation and relaxation to balance other aspects of your life.

## 2016-09-02 NOTE — Progress Notes (Signed)
Pre visit review using our clinic review tool, if applicable. No additional management support is needed unless otherwise documented below in the visit note. 

## 2016-09-02 NOTE — Progress Notes (Signed)
HPI:  Paula LoOlga L PhiferPhysical pleasant 65 year old here for a follow-up visit. She has not been exercising as much due to arthritis of her knees. She is seeing Dr. Despina HickAlusio for this and plans to do a knee replacement surgery next year. She reports she saw a rheumatologist and rheumatological conditions were ruled out. She is doing well and her blood pressure medications. She denies chest pain, shortness breath swelling or any other complaints today. She is due for bone density test and agrees to do this. She is also due for a pneumonia vaccine and agrees to go ahead and get this today.a summary of her chronic medical problems as seen below.   HTN: -meds: amlodipine-benazepril 5-10mg   Obesity: -she is walking 1 hour 3 days per week - but not as good -she has a fit bit - trying to do 10000 per day  Iron def anemia: -reports she has had this her whole life and takes iron  Allergic Rhinitis: -chronic PND, sinus issues -hx allergies and had allergy shots in the past    ROS: See pertinent positives and negatives per HPI.  Past Medical History:  Diagnosis Date  . Allergic rhinitis   . Anxiety   . DJD (degenerative joint disease)    Sees Dr. Dierdre ForthBeekman  . Headache(784.0)   . Hypertension   . Mild anemia    iron def, reports on iron her whole life  . Obesity   . URI (upper respiratory infection)   . UTI (lower urinary tract infection)     Past Surgical History:  Procedure Laterality Date  . svd     2 live births    Family History  Problem Relation Age of Onset  . Heart disease Father   . Heart disease Mother   . Colon cancer Neg Hx     Social History   Social History  . Marital status: Married    Spouse name: N/A  . Number of children: N/A  . Years of education: N/A   Occupational History  . retired    Social History Main Topics  . Smoking status: Never Smoker  . Smokeless tobacco: Never Used  . Alcohol use Yes     Comment: social use  . Drug use: No  .  Sexual activity: Not Asked   Other Topics Concern  . None   Social History Narrative   Work or School: works at [employer] and T at [employer]      Home Situation: lives with husband      Spiritual Beliefs: methodist      Lifestyle: walks; trying to eat a healthy diet              Current Outpatient Prescriptions:  .  amLODipine-benazepril (LOTREL) 5-10 MG capsule, TAKE 1 CAPSULE BY MOUTH DAILY, Disp: 90 capsule, Rfl: 1 .  aspirin 81 MG tablet, Take 81 mg by mouth daily., Disp: , Rfl:  .  calcium carbonate (OS-CAL) 600 MG TABS, Take 600 mg by mouth daily., Disp: , Rfl:  .  cholecalciferol (VITAMIN D) 1000 UNITS tablet, Take 1,000 Units by mouth daily., Disp: , Rfl:  .  ferrous sulfate 325 (65 FE) MG tablet, Take 325 mg by mouth daily with breakfast., Disp: , Rfl:  .  fish oil-omega-3 fatty acids 1000 MG capsule, Take 2 g by mouth daily., Disp: , Rfl:  .  Flavocoxid (LIMBREL) 500 MG CAPS, Take by mouth. Per patient given by Dr Dierdre ForthBeekman for knee OA, Disp: , Rfl:  .  fluticasone (  FLONASE) 50 MCG/ACT nasal spray, Place 2 sprays into both nostrils daily., Disp: 16 g, Rfl: 6 .  guaiFENesin (MUCINEX) 600 MG 12 hr tablet, TAKE 1-2 TABLETS BY MOUTH TWICE DAILY AS NEEDED, Disp: 120 tablet, Rfl: 1 .  levocetirizine (XYZAL) 2.5 MG/5ML solution, Take 5 mLs (2.5 mg total) by mouth every evening., Disp: 148 mL, Rfl: 12 .  Multiple Vitamins-Minerals (CENTRUM SILVER ULTRA WOMENS) TABS, TAKE 1 TABLET BY MOUTH EVERY DAY, Disp: 200 tablet, Rfl: 0 .  psyllium (METAMUCIL) 58.6 % powder, Take 1 packet by mouth daily., Disp: , Rfl:   EXAM:  Vitals:   09/02/16 1614  BP: 120/84  Pulse: 79  Temp: 98.1 F (36.7 C)    Body mass index is 31 kg/m.  GENERAL: vitals reviewed and listed above, alert, oriented, appears well hydrated and in no acute distress  HEENT: atraumatic, conjunttiva clear, no obvious abnormalities on inspection of external nose and ears  NECK: no obvious masses on inspection  LUNGS: clear  to auscultation bilaterally, no wheezes, rales or rhonchi, good air movement  CV: HRRR, no peripheral edema  MS: moves all extremities without noticeable abnormality  PSYCH: pleasant and cooperative, no obvious depression or anxiety  ASSESSMENT AND PLAN:  Discussed the following assessment and plan:  Essential hypertension - Plan: Basic metabolic panel, CBC (no diff)  BMI 31.0-31.9,adult  Anemia, unspecified type  Osteoarthritis of multiple joints, unspecified osteoarthritis type  Need for prophylactic vaccination against Streptococcus pneumoniae (pneumococcus) - Plan: Pneumococcal conjugate vaccine 13-valent IM  -labs -lifestyle recs -prevnar 13 -number provided for her to schedule dexa -Patient advised to return or notify a doctor immediately if symptoms worsen or persist or new concerns arise.  Patient Instructions  BEFORE YOU LEAVE: -prevnar 13 -labs -follow up: 3 months  Call the number provided to set up your bone density test.  We have ordered labs or studies at this visit. It can take up to 1-2 weeks for results and processing. IF results require follow up or explanation, we will call you with instructions. Clinically stable results will be released to your [facility]. If you have not heard from us or cannot find your results in [facility] in 2 weeks please contact our office at [phone].  If you are not yet signed up for [facility], please consider signing up.   We recommend the following healthy lifestyle for LIFE: 1) Small portions.   Tip: eat off of a salad plate instead of a dinner plate.  Tip: It is ok to feel hungry after a meal of proper portion sizes  Tip: if you need more or a snack choose fruits, veggies and/or a handful of nuts or seeds.  2) Eat a healthy clean diet.  * Tip: Avoid (less then 1 serving per week): processed foods, sweets, sweetened drinks, white starches (rice, flour, bread, potatoes, pasta, etc), red meat, fast foods, butter  *Tip: CHOOSE  instead   * 5-9 servings per day of fresh or frozen fruits and vegetables (but not corn, potatoes, bananas, canned or dried fruit)   *nuts and seeds, beans   *olives and olive oil   *small portions of lean meats such as fish and white chicken    *small portions of whole grains  3)Get at least 150 minutes of sweaty aerobic exercise per week.  4)Reduce stress - consider counseling, meditation and relaxation to balance other aspects of your life.          Kriste BasqueKIM, Kaylee Trivett R., DO

## 2016-09-03 ENCOUNTER — Other Ambulatory Visit: Payer: Self-pay | Admitting: Family Medicine

## 2016-09-03 ENCOUNTER — Telehealth: Payer: Self-pay | Admitting: Family Medicine

## 2016-09-03 DIAGNOSIS — E2839 Other primary ovarian failure: Secondary | ICD-10-CM

## 2016-09-03 LAB — CBC
HCT: 38.3 % (ref 36.0–46.0)
HEMOGLOBIN: 12.4 g/dL (ref 12.0–15.0)
MCHC: 32.5 g/dL (ref 30.0–36.0)
MCV: 89.6 fl (ref 78.0–100.0)
PLATELETS: 286 10*3/uL (ref 150.0–400.0)
RBC: 4.28 Mil/uL (ref 3.87–5.11)
RDW: 13.6 % (ref 11.5–15.5)
WBC: 6.7 10*3/uL (ref 4.0–10.5)

## 2016-09-03 LAB — BASIC METABOLIC PANEL
BUN: 9 mg/dL (ref 6–23)
CALCIUM: 10.2 mg/dL (ref 8.4–10.5)
CO2: 32 meq/L (ref 19–32)
Chloride: 100 mEq/L (ref 96–112)
Creatinine, Ser: 0.76 mg/dL (ref 0.40–1.20)
GFR: 98.2 mL/min (ref >60.00)
GLUCOSE: 89 mg/dL (ref 70–99)
POTASSIUM: 4.1 meq/L (ref 3.5–5.1)
SODIUM: 138 meq/L (ref 135–145)

## 2016-09-03 NOTE — Telephone Encounter (Signed)
Pt states Dr Selena BattenKim advised her to get a bone density, but pt needs an order.

## 2016-09-03 NOTE — Telephone Encounter (Signed)
I called the pt and informed her the order was entered and [facility] can [facility] to schedule an appt and she agreed.

## 2016-09-22 ENCOUNTER — Ambulatory Visit
Admission: RE | Admit: 2016-09-22 | Discharge: 2016-09-22 | Disposition: A | Discharge status: Home / Self Care | Payer: BC Managed Care – PPO | Source: Ambulatory Visit | Attending: Family Medicine | Admitting: Family Medicine

## 2016-09-22 DIAGNOSIS — E2839 Other primary ovarian failure: Secondary | ICD-10-CM

## 2016-10-22 ENCOUNTER — Ambulatory Visit (INDEPENDENT_AMBULATORY_CARE_PROVIDER_SITE_OTHER): Payer: BC Managed Care – PPO | Admitting: Family Medicine

## 2016-10-22 ENCOUNTER — Encounter: Payer: Self-pay | Admitting: Family Medicine

## 2016-10-22 VITALS — BP 128/84 | HR 86 | Temp 98.0°F | Ht 63.0 in

## 2016-10-22 DIAGNOSIS — Z01818 Encounter for other preprocedural examination: Secondary | ICD-10-CM | POA: Diagnosis not present

## 2016-10-22 NOTE — Patient Instructions (Addendum)
We will send office visit note and letter to your surgery office.  We hope you do great with the surgery!  Recommendations for optimizing general medical care prior to surgery: -advised patient to discuss specific risks morbidity and mortality of surgery with surgeon, CV risks discussed with patient -advised patient will defer to surgeon for post-op DVT prophylaxis and post op care -no specific medical recommendations for this patient at this time and no recommendations to defer surgery or for further CV testing prior to surgery -advise follow surgery recommendations regarding all medications, advise stopping aspirin, fish oil, supplements and ibuprofen 1 week before  Surgery; hold lotrel the day of surgery -form for pre-op optimization of general medical care prior to surgery faxed to surgeon office

## 2016-10-22 NOTE — Progress Notes (Signed)
Pre visit review using our clinic review tool, if applicable. No additional management support is needed unless otherwise documented below in the visit note. 

## 2016-10-22 NOTE — Progress Notes (Signed)
No chief complaint on file.   HPI:  Patient is seen for optimization of general medical care prior to surgery. Surgery type: 11/01/16 Date of surgery: L TKA with Dr. Elmyra Ricks for severe knee pain and OA  Kidney disease? No Prior surgeries/Issues following anesthesia? Only had colonoscopy - no issues with anesthesia Hx MI, heart arrythmia, CHF, angina or stroke? none Epilepsy or Seizures? none Arthritis or problems with neck or jaw? none Thyroid disease? none Liver disease? none Asthma, COPD or chronic lung disease? none Diabetes? none (Needs to be evaluated by anesthesia if yes to these questions.)  Other: Poor nutrition, Frail or other: no  METS:  ?Can take care of self, such as eat, dress, or use the toilet (1 MET). yes ?Can walk up a flight of steps or a hill (4 METs).yes ?Can do heavy work around the house such as scrubbing floors or lifting or moving heavy furniture (between 4 and 10 METs). Yes - walking 5000-10000 steps daily despite excruciating knee pain ?Can participate in strenuous sports such as swimming, singles tennis, football, basketball, and skiing: no due to knee pain . AHA Risks: Major predictors that require intensive management and may lead to delay in or cancellation of the operative procedure unless emergent: NONE  . Unstable coronary syndromes including unstable or severe angina or recent MI  . Decompensated heart failure including NYHA functional class IV or worsening or new-onset HF  . Significant arrhythmias including high grade AV block, symptomatic ventricular arrhythmias, supraventricular arrhythmias with ventricular rate >100 bpm at rest, symptomatic bradycardia, and newly recognized ventricular tachycardia  . Severe heart valve disease including severe aortic stenosis or symptomatic mitral stenosis   Other clinical predictors that warrant careful assessment of current status: NONE  . History of ischemic heart disease . History of cerebrovascular  disease  . History of compensated heart failure or prior heart failure  . Diabetes mellitus  . Renal insufficiency  Type of surgery and Risk: 1) High risk (reported risk of cardiac death or nonfatal myocardial infarction [MI] often greater than 5 percent):  [name] Aortic and other major vascular surgery  . Peripheral artery surgery   2)Intermediate risk (reported risk of cardiac death or nonfatal MI generally 1 to 5 percent):  [name] Carotid endarterectomy  . Head and neck surgery  . Intraperitoneal and intrathoracic surgery  . Orthopedic surgery  . Prostate surgery   3)Low risk (reported risk of cardiac death or nonfatal MI generally less than 1 percent):  [name] Ambulatory surgery  . Endoscopic procedures  . Superficial procedure  . Cataract surgery  . Breast surgery  Medications that need to be addressed prior to surgery: None Discontinue acei/arbs/non-statin lipid lowering drugs day of surgery ASA stop 7 days before or discuss with cardiology if CV risks, other anticoagulants discuss with cardiology.  ROS: See pertinent positives and negatives per HPI. 11 point ROS negative except where noted.  Past Medical History:  Diagnosis Date  . Allergic rhinitis   . Anxiety   . DJD (degenerative joint disease)    Sees Dr. Amil Amen  . Headache(784.0)   . Hypertension   . Mild anemia    iron def, reports on iron her whole life  . Obesity   . URI (upper respiratory infection)   . UTI (lower urinary tract infection)     Past Surgical History:  Procedure Laterality Date  . svd     2 live births    Family History  Problem Relation Age of Onset  .  Heart disease Father   . Heart disease Mother   . Colon cancer Neg Hx     Social History   Social History  . Marital status: Married    Spouse name: N/A  . Number of children: N/A  . Years of education: N/A   Occupational History  . retired    Social History Main Topics  . Smoking status: Never Smoker  . Smokeless tobacco: Never  Used  . Alcohol use Yes     Comment: social use  . Drug use: No  . Sexual activity: Not Asked   Other Topics Concern  . None   Social History Narrative   Work or School: works at [employer] and T at [employer] Situation: lives with husband      Spiritual Beliefs: methodist      Lifestyle: walks; trying to eat a healthy diet              Current Outpatient Prescriptions:  .  amLODipine-benazepril (LOTREL) 5-10 MG capsule, TAKE 1 CAPSULE BY MOUTH DAILY, Disp: 90 capsule, Rfl: 1 .  aspirin EC 81 MG tablet, Take 81 mg by mouth daily., Disp: , Rfl:  .  calcium carbonate (OS-CAL) 600 MG TABS, Take 600 mg by mouth daily., Disp: , Rfl:  .  cholecalciferol (VITAMIN D) 1000 UNITS tablet, Take 1,000 Units by mouth daily., Disp: , Rfl:  .  ferrous sulfate 325 (65 FE) MG tablet, Take 325 mg by mouth daily with breakfast., Disp: , Rfl:  .  fish oil-omega-3 fatty acids 1000 MG capsule, Take 2 g by mouth daily., Disp: , Rfl:  .  fluticasone (FLONASE) 50 MCG/ACT nasal spray, Place 2 sprays into both nostrils daily. (Patient taking differently: Place 2 sprays into both nostrils daily as needed (for allergies/sinus issues). ), Disp: 16 g, Rfl: 6 .  guaiFENesin (MUCINEX) 600 MG 12 hr tablet, TAKE 1-2 TABLETS BY MOUTH TWICE DAILY AS NEEDED (Patient taking differently: Take 600-1,200 mg by mouth 2 (two) times daily as needed (for allergies/sinus issues). TAKE 1-2 TABLETS BY MOUTH TWICE DAILY AS NEEDED), Disp: 120 tablet, Rfl: 1 .  ibuprofen (ADVIL,MOTRIN) 200 MG tablet, Take 800 mg by mouth every 8 (eight) hours as needed (for pain.)., Disp: , Rfl:  .  levocetirizine (XYZAL) 2.5 MG/5ML solution, Take 5 mLs (2.5 mg total) by mouth every evening. (Patient taking differently: Take 2.5 mg by mouth daily as needed (for allergies/sinus issues). ), Disp: 148 mL, Rfl: 12 .  Multiple Vitamins-Minerals (CENTRUM SILVER ULTRA WOMENS) TABS, TAKE 1 TABLET BY MOUTH EVERY DAY, Disp: 200 tablet, Rfl: 0 .  psyllium  (METAMUCIL) 58.6 % powder, Take 1 packet by mouth daily at 2 PM. , Disp: , Rfl:   EXAM:  Vitals:   10/22/16 1454  BP: 128/84  Pulse: 86  Temp: 98 F (36.7 C)    There is no height or weight on file to calculate BMI.  GENERAL: vitals reviewed and listed above, alert, oriented, appears well hydrated and in no acute distress  HEENT: atraumatic, conjunttiva clear, no obvious abnormalities on inspection of external nose and ears  NECK: no obvious masses on inspection, no carotid bruits  LUNGS: clear to auscultation bilaterally, no wheezes, rales or rhonchi, good air movement  CV: HRRR, no peripheral edema, no JVD, BP normal range, normal radial pulses  MS: moves all extremities without noticeable abnormality  PSYCH: pleasant and cooperative, no obvious depression or anxiety  ASSESSMENT AND PLAN:  Discussed  the following assessment and plan: More than 50% of over 30  minutes spent in total in caring for this patient was spent face-to-face with the patient, counseling and/or coordinating care.    Preop examination  Assessment: -Risk factors: none -Surgery Risks:intermediate -age, nutritional status, fraility: good nutritional status, age +29, no fraility -functional capacity: > 4 METs wethout symptoms -comorbidities: none Patient Specific Risks: patient is low risk for intermediate risks surgery  Recommendations for optimizing general medical care prior to surgery: -advised patient to discuss specific risks morbidity and mortality of surgery with surgeon, CV risks discussed with patient -advised patient will defer to surgeon for post-op DVT prophylaxis and post op care -no specific medical recommendations for this patient at this time and no recommendations to defer surgery or for further CV testing prior to surgery -advise follow surgery recommendations regarding all medications, advise stopping aspirin, fish oil, supplements and ibuprofen 1 week before  Surgery; hold lotrel  the day of surgery -form for pre-op optimization of general medical care prior to surgery faxed to surgeon office  -Patient advised to return or notify a doctor immediately if symptoms worsen or persist or new concerns arise.  Patient Instructions  We will send office visit note and letter to your surgery office.  We hope you do great with the surgery!  Recommendations for optimizing general medical care prior to surgery: -advised patient to discuss specific risks morbidity and mortality of surgery with surgeon, CV risks discussed with patient -advised patient will defer to surgeon for post-op DVT prophylaxis and post op care -no specific medical recommendations for this patient at this time and no recommendations to defer surgery or for further CV testing prior to surgery -advise follow surgery recommendations regarding all medications, advise stopping aspirin, fish oil, supplements and ibuprofen 1 week before  Surgery; hold lotrel the day of surgery -form for pre-op optimization of general medical care prior to surgery faxed to surgeon office    Paula Kelley.

## 2016-10-28 ENCOUNTER — Encounter (HOSPITAL_COMMUNITY): Payer: Self-pay

## 2016-10-28 ENCOUNTER — Encounter (HOSPITAL_COMMUNITY)
Admission: RE | Admit: 2016-10-28 | Discharge: 2016-10-28 | Disposition: A | Discharge status: Home / Self Care | Payer: BC Managed Care – PPO | Source: Ambulatory Visit | Attending: Orthopedic Surgery | Admitting: Orthopedic Surgery

## 2016-10-28 DIAGNOSIS — Z0183 Encounter for blood typing: Secondary | ICD-10-CM | POA: Insufficient documentation

## 2016-10-28 DIAGNOSIS — M1712 Unilateral primary osteoarthritis, left knee: Secondary | ICD-10-CM | POA: Diagnosis not present

## 2016-10-28 DIAGNOSIS — I498 Other specified cardiac arrhythmias: Secondary | ICD-10-CM | POA: Diagnosis not present

## 2016-10-28 DIAGNOSIS — Z01812 Encounter for preprocedural laboratory examination: Secondary | ICD-10-CM | POA: Insufficient documentation

## 2016-10-28 DIAGNOSIS — I10 Essential (primary) hypertension: Secondary | ICD-10-CM | POA: Diagnosis not present

## 2016-10-28 DIAGNOSIS — Z01818 Encounter for other preprocedural examination: Secondary | ICD-10-CM | POA: Diagnosis present

## 2016-10-28 LAB — URINALYSIS, ROUTINE W REFLEX MICROSCOPIC
Bilirubin Urine: NEGATIVE
Glucose, UA: NEGATIVE mg/dL
Hgb urine dipstick: NEGATIVE
Ketones, ur: NEGATIVE mg/dL
LEUKOCYTES UA: NEGATIVE
NITRITE: NEGATIVE
PH: 8 (ref 5.0–8.0)
Protein, ur: NEGATIVE mg/dL
SPECIFIC GRAVITY, URINE: 1.003 — AB (ref 1.005–1.030)

## 2016-10-28 LAB — CBC
HEMATOCRIT: 35.7 % — AB (ref 36.0–46.0)
HEMOGLOBIN: 11.6 g/dL — AB (ref 12.0–15.0)
MCH: 28.4 pg (ref 26.0–34.0)
MCHC: 32.5 g/dL (ref 30.0–36.0)
MCV: 87.5 fL (ref 78.0–100.0)
Platelets: 293 10*3/uL (ref 150–400)
RBC: 4.08 MIL/uL (ref 3.87–5.11)
RDW: 13.2 % (ref 11.5–15.5)
WBC: 6 10*3/uL (ref 4.0–10.5)

## 2016-10-28 LAB — SURGICAL PCR SCREEN
MRSA, PCR: NEGATIVE
Staphylococcus aureus: NEGATIVE

## 2016-10-28 LAB — COMPREHENSIVE METABOLIC PANEL
ALBUMIN: 4.4 g/dL (ref 3.5–5.0)
ALK PHOS: 77 U/L (ref 38–126)
ALT: 16 U/L (ref 14–54)
AST: 19 U/L (ref 15–41)
Anion gap: 7 (ref 5–15)
BILIRUBIN TOTAL: 0.5 mg/dL (ref 0.3–1.2)
BUN: 8 mg/dL (ref 6–20)
CALCIUM: 9.6 mg/dL (ref 8.9–10.3)
CO2: 30 mmol/L (ref 22–32)
Chloride: 104 mmol/L (ref 101–111)
Creatinine, Ser: 0.59 mg/dL (ref 0.44–1.00)
GFR calc Af Amer: >60 mL/min (ref >60)
GLUCOSE: 96 mg/dL (ref 65–99)
POTASSIUM: 3.8 mmol/L (ref 3.5–5.1)
Sodium: 141 mmol/L (ref 135–145)
TOTAL PROTEIN: 7.7 g/dL (ref 6.5–8.1)

## 2016-10-28 LAB — PROTIME-INR
INR: 0.99
PROTHROMBIN TIME: 13.1 s (ref 11.4–15.2)

## 2016-10-28 LAB — ABO/RH: ABO/RH(D): B POS

## 2016-10-28 LAB — APTT: APTT: 28 s (ref 24–36)

## 2016-10-28 NOTE — Patient Instructions (Signed)
Paula Kelley  10/28/2016   Your procedure is scheduled on: Monday 11/01/2016  Report to [facility] Main  Entrance take [place]  elevators to 3rd floor to  Short Stay Center at  1045  AM.  Call this number if you have problems the morning of surgery [phone]   Remember: ONLY 1 PERSON MAY GO WITH YOU TO SHORT STAY TO GET  READY MORNING OF YOUR SURGERY.   Do not eat food  :After Midnight.  May have clear liquids from midnight up until 0745 am then nothing until after surgery!     CLEAR LIQUID DIET   Foods Allowed                                                                     Foods Excluded  Coffee and tea, regular and decaf                             liquids that you cannot  Plain Jell-O in any flavor                                             see through such as: Fruit ices (not with fruit pulp)                                     milk, soups, orange juice  Iced Popsicles                                    All solid food Carbonated beverages, regular and diet                                    Cranberry, grape and apple juices Sports drinks like Gatorade Lightly seasoned clear broth or consume(fat free) Sugar, honey syrup  Sample Menu Breakfast                                Lunch                                     Supper Cranberry juice                    Beef broth                            Chicken broth Jell-O                                     Grape juice  Apple juice Coffee or tea                        Jell-O                                      Popsicle                                                Coffee or tea                        Coffee or tea  _____________________________________________________________________     Take these medicines the morning of surgery with A SIP OF WATER: use Flonase nasal spray if needed                                You may not have any metal on your body including hair  pins and              piercings  Do not wear jewelry, make-up, lotions, powders or perfumes, deodorant             Do not wear nail polish.  Do not shave  48 hours prior to surgery.              Men may shave face and neck.   Do not bring valuables to the hospital. [place] IS NOT             RESPONSIBLE   FOR VALUABLES.  Contacts, dentures or bridgework may not be worn into surgery.  Leave suitcase in the car. After surgery it may be brought to your room.                  Please read over the following fact sheets you were given: _____________________________________________________________________             [facility] - Preparing for Surgery Before surgery, you can play an important role.  Because skin is not sterile, your skin needs to be as free of germs as possible.  You can reduce the number of germs on your skin by washing with CHG (chlorahexidine gluconate) soap before surgery.  CHG is an antiseptic cleaner which kills germs and bonds with the skin to continue killing germs even after washing. Please DO NOT use if you have an allergy to CHG or antibacterial soaps.  If your skin becomes reddened/irritated stop using the CHG and inform your nurse when you arrive at Short Stay. Do not shave (including legs and underarms) for at least 48 hours prior to the first CHG shower.  You may shave your face/neck. Please follow these instructions carefully:  1.  Shower with CHG Soap the night before surgery and the  morning of Surgery.  2.  If you choose to wash your hair, wash your hair first as usual with your  normal  shampoo.  3.  After you shampoo, rinse your hair and body thoroughly to remove the  shampoo.                           4.  Use CHG  as you would any other liquid soap.  You can apply chg directly  to the skin and wash                       Gently with a scrungie or clean washcloth.  5.  Apply the CHG Soap to your body ONLY FROM THE NECK DOWN.   Do not use on face/ open                            Wound or open sores. Avoid contact with eyes, ears mouth and genitals (private parts).                       Wash face,  Genitals (private parts) with your normal soap.             6.  Wash thoroughly, paying special attention to the area where your surgery  will be performed.  7.  Thoroughly rinse your body with warm water from the neck down.  8.  DO NOT shower/wash with your normal soap after using and rinsing off  the CHG Soap.                9.  Pat yourself dry with a clean towel.            10.  Wear clean pajamas.            11.  Place clean sheets on your bed the night of your first shower and do not  sleep with pets. Day of Surgery : Do not apply any lotions/deodorants the morning of surgery.  Please wear clean clothes to the hospital/surgery center.  FAILURE TO FOLLOW THESE INSTRUCTIONS MAY RESULT IN THE CANCELLATION OF YOUR SURGERY PATIENT SIGNATURE_________________________________  NURSE SIGNATURE__________________________________  ________________________________________________________________________   [name]  An incentive spirometer is a tool that can help keep your lungs clear and active. This tool measures how well you are filling your lungs with each breath. Taking long deep breaths may help reverse or decrease the chance of developing breathing (pulmonary) problems (especially infection) following:  A long period of time when you are unable to move or be active. BEFORE THE PROCEDURE   If the spirometer includes an indicator to show your best effort, your nurse or respiratory therapist will set it to a desired goal.  If possible, sit up straight or lean slightly forward. Try not to slouch.  Hold the incentive spirometer in an upright position. INSTRUCTIONS FOR USE  1. Sit on the edge of your bed if possible, or sit up as far as you can in bed or on a chair. 2. Hold the incentive spirometer in an upright position. 3. Breathe out  normally. 4. Place the mouthpiece in your mouth and seal your lips tightly around it. 5. Breathe in slowly and as deeply as possible, raising the piston or the ball toward the top of the column. 6. Hold your breath for 3-5 seconds or for as long as possible. Allow the piston or ball to fall to the bottom of the column. 7. Remove the mouthpiece from your mouth and breathe out normally. 8. Rest for a few seconds and repeat Steps 1 through 7 at least 10 times every 1-2 hours when you are awake. Take your time and take a few normal breaths between deep breaths. 9. The spirometer may include an indicator to show your best effort.  Use the indicator as a goal to work toward during each repetition. 10. After each set of 10 deep breaths, practice coughing to be sure your lungs are clear. If you have an incision (the cut made at the time of surgery), support your incision when coughing by placing a pillow or rolled up towels firmly against it. Once you are able to get out of bed, walk around indoors and cough well. You may stop using the incentive spirometer when instructed by your caregiver.  RISKS AND COMPLICATIONS  Take your time so you do not get dizzy or light-headed.  If you are in pain, you may need to take or ask for pain medication before doing incentive spirometry. It is harder to take a deep breath if you are having pain. AFTER USE  Rest and breathe slowly and easily.  It can be helpful to keep track of a log of your progress. Your caregiver can provide you with a simple table to help with this. If you are using the spirometer at home, follow these instructions: SEEK MEDICAL CARE IF:   You are having difficultly using the spirometer.  You have trouble using the spirometer as often as instructed.  Your pain medication is not giving enough relief while using the spirometer.  You develop fever of 100.5 F (38.1 C) or higher. SEEK IMMEDIATE MEDICAL CARE IF:   You cough up bloody sputum  that had not been present before.  You develop fever of 102 F (38.9 C) or greater.  You develop worsening pain at or near the incision site. MAKE SURE YOU:   Understand these instructions.  Will watch your condition.  Will get help right away if you are not doing well or get worse. Document Released: 02/14/2007 Document Revised: 12/27/2011 Document Reviewed: 04/17/2007 ExitCare Patient Information 2014 [employer], [place].   ________________________________________________________________________  WHAT IS A BLOOD TRANSFUSION? Blood Transfusion Information  A transfusion is the replacement of blood or some of its parts. Blood is made up of multiple cells which provide different functions.  Red blood cells carry oxygen and are used for blood loss replacement.  White blood cells fight against infection.  Platelets control bleeding.  Plasma helps clot blood.  Other blood products are available for specialized needs, such as hemophilia or other clotting disorders. BEFORE THE TRANSFUSION  Who gives blood for transfusions?   Healthy volunteers who are fully evaluated to make sure their blood is safe. This is blood bank blood. Transfusion therapy is the safest it has ever been in the practice of medicine. Before blood is taken from a donor, a complete history is taken to make sure that person has no history of diseases nor engages in risky social behavior (examples are intravenous drug use or sexual activity with multiple partners). The donor's travel history is screened to minimize risk of transmitting infections, such as malaria. The donated blood is tested for signs of infectious diseases, such as HIV and hepatitis. The blood is then tested to be sure it is compatible with you in order to minimize the chance of a transfusion reaction. If you or a relative donates blood, this is often done in anticipation of surgery and is not appropriate for emergency situations. It takes many days to  process the donated blood. RISKS AND COMPLICATIONS Although transfusion therapy is very safe and saves many lives, the main dangers of transfusion include:   Getting an infectious disease.  Developing a transfusion reaction. This is an allergic reaction to something in the  blood you were given. Every precaution is taken to prevent this. The decision to have a blood transfusion has been considered carefully by your caregiver before blood is given. Blood is not given unless the benefits outweigh the risks. AFTER THE TRANSFUSION  Right after receiving a blood transfusion, you will usually feel much better and more energetic. This is especially true if your red blood cells have gotten low (anemic). The transfusion raises the level of the red blood cells which carry oxygen, and this usually causes an energy increase.  The nurse administering the transfusion will monitor you carefully for complications. HOME CARE INSTRUCTIONS  No special instructions are needed after a transfusion. You may find your energy is better. Speak with your caregiver about any limitations on activity for underlying diseases you may have. SEEK MEDICAL CARE IF:   Your condition is not improving after your transfusion.  You develop redness or irritation at the intravenous (IV) site. SEEK IMMEDIATE MEDICAL CARE IF:  Any of the following symptoms occur over the next 12 hours:  Shaking chills.  You have a temperature by mouth above 102 F (38.9 C), not controlled by medicine.  Chest, back, or muscle pain.  People around you feel you are not acting correctly or are confused.  Shortness of breath or difficulty breathing.  Dizziness and fainting.  You get a rash or develop hives.  You have a decrease in urine output.  Your urine turns a dark color or changes to pink, red, or brown. Any of the following symptoms occur over the next 10 days:  You have a temperature by mouth above 102 F (38.9 C), not controlled by  medicine.  Shortness of breath.  Weakness after normal activity.  The white part of the eye turns yellow (jaundice).  You have a decrease in the amount of urine or are urinating less often.  Your urine turns a dark color or changes to pink, red, or brown. Document Released: 10/01/2000 Document Revised: 12/27/2011 Document Reviewed: 05/20/2008 [facility] Patient Information 2014 [place], [place].  _______________________________________________________________________

## 2016-10-31 ENCOUNTER — Ambulatory Visit: Payer: Self-pay | Admitting: Orthopedic Surgery

## 2016-10-31 NOTE — H&P (Signed)
Paula Kelley DOB: 08/29/1951 Married / Language: [place]; Flemish / Race: Black or African American Female Date of Admission:  11/01/2016 CC: Left Knee Pain History of Present Illness The patient is a 66 year old female who comes in  for a preoperative History and Physical. The patient is scheduled for a left total knee arthroplasty to be performed by Dr. Gus Rankin. Aluisio, MD at [facility] on 11-01-2016. The patient is a 66 year old female who presents with knee complaints. The patient reports right knee symptoms including: pain, swelling and locking (and popping) which began year(s) ago without any known injury. The patient describes the severity of the symptoms as moderate in severity. The patient feels that the symptoms are worsening. Past treatment for this problem has included knee brace and nonsteroidal anti-inflammatory drugs. Onset of symptoms was gradual. Symptoms are exacerbated by walking (long distances) and squatting. Symptoms are relieved by rest, while symptoms are not relieved by ice. Current treatment includes nonsteroidal anti-inflammatory drugs. Pertinent medical history includes osteoarthritis. Note for "Knee pain": She has been told in the past she has DJD. She does have knee sleeves which she has tried in the past with varied success, sometimes they feel too tight and sometimes they don't stay on. She does actually have hinged braces as well. She enjoys walking for exercise and tries to walk 2-3 miles several times per week but has had to decrease some due to knee pain. She notes stiffness afterwards, especially the next day. Stiffness in AM and after sitting. Unfortunately, both knees are giving her a lot of trouble. It is becoming increasingly difficult for her to get around. She cannot do stairs anymore. She in fact is hoping to be able to acquire a stair lift that will be able to take her up the steps. She no longer feels safe doing that activity. She is hurting pretty  much with all activities. She even has pain at rest. She can usually sleep at night but occasionally does get pain. Left knee is more symptomatic than the right. She has had injections in the past, which have not provided any benefit. She is ready to get the knee fixed. They have been treated conservatively in the past for the above stated problem and despite conservative measures, they continue to have progressive pain and severe functional limitations and dysfunction. They have failed non-operative management including home exercise, medications, and injections. It is felt that they would benefit from undergoing total joint replacement. Risks and benefits of the procedure have been discussed with the patient and they elect to proceed with surgery. There are no active contraindications to surgery such as ongoing infection or rapidly progressive neurological disease.  Problem List/Past Medical  Primary osteoarthritis of both knees (M17.0)  Disorder, shoulder region NEC (726.2) [11/18/2005]: High blood pressure  Varicose veins  Menopause   Allergies  Sulfa Drugs   Family History Heart Disease  mother and father Heart disease in female family member before age 9   Social History Alcohol use  current drinker; drinks wine; only occasionally per week Children  2 Current work status  working full time Drug/Alcohol Rehab (Currently)  no Exercise  Exercises weekly; does running / walking Illicit drug use  no Living situation  live with spouse Marital status  married Most recent primary occupation  Scientist, clinical (histocompatibility and immunogenetics) Pain Contract  no Previously in rehab  no Tobacco / smoke exposure  no Tobacco use  never smoker [employer]chant [employer] officer  Living Will  Medication  History  Cholecalciferol (1000UNIT Tablet, Oral) Active. Fish Oil (1000MG  Capsule, Oral) Active. Calcium Carbonate (600MG  Tablet, Oral) Active. Xyzal (2.5MG /5ML Solution, Oral) Active. Amlodipine Besy-Benazepril  HCl (5-10MG  Capsule, Oral) Active. Fluticasone Propionate (50MCG/ACT Suspension, Nasal) Active. Aspirin (81MG  Tablet DR, Oral) Active.   Past Surgical History No pertinent past surgical history    Review of Systems General Not Present- Chills, Fatigue, Fever, Memory Loss, Night Sweats, Weight Gain and Weight Loss. Skin Not Present- Eczema, Hives, Itching, Lesions and Rash. HEENT Not Present- Dentures, Double Vision, Headache, Hearing Loss, Tinnitus and Visual Loss. Respiratory Not Present- Allergies, Chronic Cough, Coughing up blood, Shortness of breath at rest and Shortness of breath with exertion. Cardiovascular Not Present- Chest Pain, Difficulty Breathing Lying Down, Murmur, Palpitations, Racing/skipping heartbeats and Swelling. Gastrointestinal Not Present- Abdominal Pain, Bloody Stool, Constipation, Diarrhea, Difficulty Swallowing, Heartburn, Jaundice, Loss of appetitie, Nausea and Vomiting. Female Genitourinary Not Present- Blood in Urine, Discharge, Flank Pain, Incontinence, Painful Urination, Urgency, Urinary frequency, Urinary Retention, Urinating at Night and Weak urinary stream. Musculoskeletal Present- Joint Pain, Joint Swelling and Morning Stiffness. Not Present- Back Pain, Muscle Pain, Muscle Weakness and Spasms. Neurological Not Present- Blackout spells, Difficulty with balance, Dizziness, Paralysis, Tremor and Weakness. Psychiatric Not Present- Insomnia.  Vitals Weight: 175 lb Height: 64in Weight was reported by patient. Height was reported by patient. Body Surface Area: 1.85 m Body Mass Index: 30.04 kg/m  Pulse: 76 (Regular)  BP: 144/82 (Sitting, Right Arm, Standard)   Physical Exam  General Mental Status -Alert, cooperative and good historian. General Appearance-pleasant, Not in acute distress. Orientation-Oriented X3. Build & Nutrition-Well nourished and Well developed.  Head and Neck Head-normocephalic, atraumatic . Neck Global  Assessment - supple, no bruit auscultated on the right, no bruit auscultated on the left.  Eye Vision-Wears corrective lenses. Pupil - Bilateral-Regular and Round. Motion - Bilateral-EOMI.  Chest and Lung Exam Auscultation Breath sounds - clear at anterior chest wall and clear at posterior chest wall. Adventitious sounds - No Adventitious sounds.  Cardiovascular Auscultation Rhythm - Regular rate and rhythm. Heart Sounds - S1 WNL and S2 WNL. Murmurs & Other Heart Sounds - Auscultation of the heart reveals - No Murmurs.  Abdomen Palpation/Percussion Tenderness - Abdomen is non-tender to palpation. Rigidity (guarding) - Abdomen is soft. Auscultation Auscultation of the abdomen reveals - Bowel sounds normal.  Female Genitourinary Note: Not done, not pertinent to present illness   Musculoskeletal Note: On exam, she is in no distress. Her hips show normal range of motion with no discomfort. Left knee, slight valgus, range about 5 to 125, tender lateral and medial. There is no instability noted. Right knee also has valgus deformity, range 0 to 130. Moderate crepitus on range of motion. Tender medial greater than lateral with no instability noted. Pulse, sensation and motor are intact in both lower extremities.  RADIOGRAPHS AP and lateral of both knees show that she has bone on bone arthritis in the lateral and patellofemoral compartments of both knees, left a little worse than the right with valgus deformity bilaterally.  Assessment & Plan Primary osteoarthritis of right knee (M17.11) Primary osteoarthritis of left knee (M17.12)  Note:Surgical Plans: Left Total Knee Replacement  Disposition: Home  PCP: Dr. Kriste BasqueHannah Kim  IV TXA  Anesthesia Issues: None  Signed electronically by Beckey RutterAlezandrew L Perkins, III PA-C

## 2016-11-01 ENCOUNTER — Encounter (HOSPITAL_COMMUNITY): Payer: Self-pay | Admitting: *Deleted

## 2016-11-01 ENCOUNTER — Encounter (HOSPITAL_COMMUNITY)
Admission: RE | Disposition: A | Discharge status: Home Health | Payer: Self-pay | Source: Ambulatory Visit | Attending: Orthopedic Surgery

## 2016-11-01 ENCOUNTER — Inpatient Hospital Stay (HOSPITAL_COMMUNITY)
Admission: RE | Admit: 2016-11-01 | Discharge: 2016-11-04 | DRG: 470 | Disposition: A | Discharge status: Home Health | Payer: BC Managed Care – PPO | Source: Ambulatory Visit | Attending: Orthopedic Surgery | Admitting: Orthopedic Surgery

## 2016-11-01 ENCOUNTER — Inpatient Hospital Stay (HOSPITAL_COMMUNITY): Payer: BC Managed Care – PPO | Admitting: Anesthesiology

## 2016-11-01 DIAGNOSIS — M21061 Valgus deformity, not elsewhere classified, right knee: Secondary | ICD-10-CM | POA: Diagnosis present

## 2016-11-01 DIAGNOSIS — E669 Obesity, unspecified: Secondary | ICD-10-CM | POA: Diagnosis present

## 2016-11-01 DIAGNOSIS — Z79899 Other long term (current) drug therapy: Secondary | ICD-10-CM

## 2016-11-01 DIAGNOSIS — M170 Bilateral primary osteoarthritis of knee: Secondary | ICD-10-CM | POA: Diagnosis present

## 2016-11-01 DIAGNOSIS — Z6830 Body mass index (BMI) 30.0-30.9, adult: Secondary | ICD-10-CM

## 2016-11-01 DIAGNOSIS — M25562 Pain in left knee: Secondary | ICD-10-CM | POA: Diagnosis present

## 2016-11-01 DIAGNOSIS — M21062 Valgus deformity, not elsewhere classified, left knee: Secondary | ICD-10-CM | POA: Diagnosis present

## 2016-11-01 DIAGNOSIS — I10 Essential (primary) hypertension: Secondary | ICD-10-CM | POA: Diagnosis present

## 2016-11-01 DIAGNOSIS — M1710 Unilateral primary osteoarthritis, unspecified knee: Secondary | ICD-10-CM

## 2016-11-01 DIAGNOSIS — Z7982 Long term (current) use of aspirin: Secondary | ICD-10-CM | POA: Diagnosis not present

## 2016-11-01 DIAGNOSIS — M179 Osteoarthritis of knee, unspecified: Secondary | ICD-10-CM | POA: Diagnosis present

## 2016-11-01 DIAGNOSIS — F419 Anxiety disorder, unspecified: Secondary | ICD-10-CM | POA: Diagnosis present

## 2016-11-01 HISTORY — PX: TOTAL KNEE ARTHROPLASTY: SHX125

## 2016-11-01 LAB — TYPE AND SCREEN
ABO/RH(D): B POS
Antibody Screen: NEGATIVE

## 2016-11-01 SURGERY — ARTHROPLASTY, KNEE, TOTAL
Anesthesia: Monitor Anesthesia Care | Site: Knee | Laterality: Left

## 2016-11-01 MED ORDER — CEFAZOLIN SODIUM-DEXTROSE 2-4 GM/100ML-% IV SOLN
2.0000 g | Freq: Four times a day (QID) | INTRAVENOUS | Status: AC
  Administered 2016-11-01 – 2016-11-02 (×2): 2 g via INTRAVENOUS
  Filled 2016-11-01 (×2): qty 100

## 2016-11-01 MED ORDER — TRANEXAMIC ACID 1000 MG/10ML IV SOLN
1000.0000 mg | Freq: Once | INTRAVENOUS | Status: AC
  Administered 2016-11-01: 1000 mg via INTRAVENOUS
  Filled 2016-11-01: qty 10

## 2016-11-01 MED ORDER — ONDANSETRON HCL 4 MG/2ML IJ SOLN
INTRAMUSCULAR | Status: AC
  Filled 2016-11-01: qty 2

## 2016-11-01 MED ORDER — ACETAMINOPHEN 325 MG PO TABS: 650.0000 mg | ORAL_TABLET | Freq: Four times a day (QID) | ORAL | Status: DC | PRN

## 2016-11-01 MED ORDER — CEFAZOLIN SODIUM-DEXTROSE 2-4 GM/100ML-% IV SOLN
INTRAVENOUS | Status: AC
  Filled 2016-11-01: qty 100

## 2016-11-01 MED ORDER — RIVAROXABAN 10 MG PO TABS
10.0000 mg | ORAL_TABLET | Freq: Every day | ORAL | Status: DC
  Administered 2016-11-02 – 2016-11-04 (×3): 10 mg via ORAL
  Filled 2016-11-01 (×3): qty 1

## 2016-11-01 MED ORDER — LEVOCETIRIZINE DIHYDROCHLORIDE 2.5 MG/5ML PO SOLN: 2.5000 mg | Freq: Every day | ORAL | Status: DC | PRN

## 2016-11-01 MED ORDER — MEPERIDINE HCL 50 MG/ML IJ SOLN: 6.2500 mg | INTRAMUSCULAR | Status: DC | PRN

## 2016-11-01 MED ORDER — SODIUM CHLORIDE 0.9 % IJ SOLN
INTRAMUSCULAR | Status: AC
  Filled 2016-11-01: qty 50

## 2016-11-01 MED ORDER — ONDANSETRON HCL 4 MG/2ML IJ SOLN: 4.0000 mg | Freq: Four times a day (QID) | INTRAMUSCULAR | Status: DC | PRN

## 2016-11-01 MED ORDER — FLUTICASONE PROPIONATE 50 MCG/ACT NA SUSP
2.0000 | Freq: Every day | NASAL | Status: DC | PRN
  Filled 2016-11-01: qty 16

## 2016-11-01 MED ORDER — TRANEXAMIC ACID 1000 MG/10ML IV SOLN
1000.0000 mg | INTRAVENOUS | Status: AC
  Administered 2016-11-01: 1000 mg via INTRAVENOUS
  Filled 2016-11-01: qty 1100

## 2016-11-01 MED ORDER — BUPIVACAINE IN DEXTROSE 0.75-8.25 % IT SOLN
INTRATHECAL | Status: DC | PRN
  Administered 2016-11-01: 2 mL via INTRATHECAL

## 2016-11-01 MED ORDER — SODIUM CHLORIDE 0.9 % IJ SOLN
INTRAMUSCULAR | Status: DC | PRN
  Administered 2016-11-01: 30 mL

## 2016-11-01 MED ORDER — PHENOL 1.4 % MT LIQD
1.0000 | OROMUCOSAL | Status: DC | PRN
  Filled 2016-11-01: qty 177

## 2016-11-01 MED ORDER — FENTANYL CITRATE (PF) 100 MCG/2ML IJ SOLN
50.0000 ug | INTRAMUSCULAR | Status: DC | PRN
  Administered 2016-11-01: 50 ug via INTRAVENOUS

## 2016-11-01 MED ORDER — METOCLOPRAMIDE HCL 5 MG/ML IJ SOLN: 5.0000 mg | Freq: Three times a day (TID) | INTRAMUSCULAR | Status: DC | PRN

## 2016-11-01 MED ORDER — METOCLOPRAMIDE HCL 5 MG PO TABS: 5.0000 mg | ORAL_TABLET | Freq: Three times a day (TID) | ORAL | Status: DC | PRN

## 2016-11-01 MED ORDER — FERROUS SULFATE 325 (65 FE) MG PO TABS
325.0000 mg | ORAL_TABLET | Freq: Every day | ORAL | Status: DC
  Administered 2016-11-02 – 2016-11-04 (×3): 325 mg via ORAL
  Filled 2016-11-01 (×3): qty 1

## 2016-11-01 MED ORDER — ACETAMINOPHEN 650 MG RE SUPP
650.0000 mg | Freq: Four times a day (QID) | RECTAL | Status: DC | PRN
Start: 2016-11-01 — End: 2016-11-04

## 2016-11-01 MED ORDER — PROPOFOL 10 MG/ML IV BOLUS
INTRAVENOUS | Status: AC
  Filled 2016-11-01: qty 60

## 2016-11-01 MED ORDER — CHLORHEXIDINE GLUCONATE 4 % EX LIQD: 60.0000 mL | Freq: Once | CUTANEOUS | Status: DC

## 2016-11-01 MED ORDER — ONDANSETRON HCL 4 MG/2ML IJ SOLN
INTRAMUSCULAR | Status: DC | PRN
  Administered 2016-11-01: 4 mg via INTRAVENOUS

## 2016-11-01 MED ORDER — GUAIFENESIN ER 600 MG PO TB12: 600.0000 mg | ORAL_TABLET | Freq: Two times a day (BID) | ORAL | Status: DC | PRN

## 2016-11-01 MED ORDER — BISACODYL 10 MG RE SUPP: 10.0000 mg | Freq: Every day | RECTAL | Status: DC | PRN

## 2016-11-01 MED ORDER — LIDOCAINE 2% (20 MG/ML) 5 ML SYRINGE
INTRAMUSCULAR | Status: DC | PRN
  Administered 2016-11-01: 40 mg via INTRAVENOUS

## 2016-11-01 MED ORDER — DOCUSATE SODIUM 100 MG PO CAPS
100.0000 mg | ORAL_CAPSULE | Freq: Two times a day (BID) | ORAL | Status: DC
  Administered 2016-11-01 – 2016-11-04 (×6): 100 mg via ORAL
  Filled 2016-11-01 (×6): qty 1

## 2016-11-01 MED ORDER — CETIRIZINE HCL 5 MG/5ML PO SYRP
2.5000 mg | ORAL_SOLUTION | Freq: Every day | ORAL | Status: DC | PRN
  Filled 2016-11-01: qty 5

## 2016-11-01 MED ORDER — BUPIVACAINE LIPOSOME 1.3 % IJ SUSP
INTRAMUSCULAR | Status: DC | PRN
  Administered 2016-11-01: 20 mL

## 2016-11-01 MED ORDER — PROPOFOL 10 MG/ML IV BOLUS
INTRAVENOUS | Status: DC | PRN
  Administered 2016-11-01: 30 mg via INTRAVENOUS
  Administered 2016-11-01: 20 mg via INTRAVENOUS

## 2016-11-01 MED ORDER — ACETAMINOPHEN 500 MG PO TABS
1000.0000 mg | ORAL_TABLET | Freq: Four times a day (QID) | ORAL | Status: AC
  Administered 2016-11-01 – 2016-11-02 (×4): 1000 mg via ORAL
  Filled 2016-11-01 (×4): qty 2

## 2016-11-01 MED ORDER — ROPIVACAINE HCL 7.5 MG/ML IJ SOLN
INTRAMUSCULAR | Status: AC
  Filled 2016-11-01: qty 20

## 2016-11-01 MED ORDER — MORPHINE SULFATE (PF) 2 MG/ML IV SOLN: 1.0000 mg | INTRAVENOUS | Status: DC | PRN

## 2016-11-01 MED ORDER — ACETAMINOPHEN 10 MG/ML IV SOLN
1000.0000 mg | Freq: Once | INTRAVENOUS | Status: AC
  Administered 2016-11-01: 1000 mg via INTRAVENOUS
  Filled 2016-11-01: qty 100

## 2016-11-01 MED ORDER — POLYETHYLENE GLYCOL 3350 17 G PO PACK: 17.0000 g | PACK | Freq: Every day | ORAL | Status: DC | PRN

## 2016-11-01 MED ORDER — ONDANSETRON HCL 4 MG/2ML IJ SOLN: 4.0000 mg | Freq: Once | INTRAMUSCULAR | Status: DC | PRN

## 2016-11-01 MED ORDER — MIDAZOLAM HCL 2 MG/2ML IJ SOLN
INTRAMUSCULAR | Status: AC
  Filled 2016-11-01: qty 2

## 2016-11-01 MED ORDER — DEXAMETHASONE SODIUM PHOSPHATE 10 MG/ML IJ SOLN
10.0000 mg | Freq: Once | INTRAMUSCULAR | Status: AC
  Administered 2016-11-01: 10 mg via INTRAVENOUS

## 2016-11-01 MED ORDER — ACETAMINOPHEN 10 MG/ML IV SOLN
INTRAVENOUS | Status: AC
  Filled 2016-11-01: qty 100

## 2016-11-01 MED ORDER — MIDAZOLAM HCL 5 MG/ML IJ SOLN
1.0000 mg | INTRAMUSCULAR | Status: DC | PRN
  Administered 2016-11-01: 2 mg via INTRAVENOUS

## 2016-11-01 MED ORDER — FENTANYL CITRATE (PF) 100 MCG/2ML IJ SOLN
INTRAMUSCULAR | Status: AC
  Filled 2016-11-01: qty 2

## 2016-11-01 MED ORDER — FLEET ENEMA 7-19 GM/118ML RE ENEM: 1.0000 | ENEMA | Freq: Once | RECTAL | Status: DC | PRN

## 2016-11-01 MED ORDER — LIDOCAINE 2% (20 MG/ML) 5 ML SYRINGE
INTRAMUSCULAR | Status: AC
  Filled 2016-11-01: qty 5

## 2016-11-01 MED ORDER — HYDROMORPHONE HCL 1 MG/ML IJ SOLN: 0.2500 mg | INTRAMUSCULAR | Status: DC | PRN

## 2016-11-01 MED ORDER — ROPIVACAINE HCL 7.5 MG/ML IJ SOLN
INTRAMUSCULAR | Status: DC | PRN
  Administered 2016-11-01: 20 mL via PERINEURAL

## 2016-11-01 MED ORDER — 0.9 % SODIUM CHLORIDE (POUR BTL) OPTIME
TOPICAL | Status: DC | PRN
  Administered 2016-11-01: 1000 mL

## 2016-11-01 MED ORDER — METHOCARBAMOL 500 MG PO TABS
500.0000 mg | ORAL_TABLET | Freq: Four times a day (QID) | ORAL | Status: DC | PRN
  Administered 2016-11-01 – 2016-11-03 (×6): 500 mg via ORAL
  Filled 2016-11-01 (×6): qty 1

## 2016-11-01 MED ORDER — STERILE WATER FOR IRRIGATION IR SOLN
Status: DC | PRN
  Administered 2016-11-01: 2000 mL

## 2016-11-01 MED ORDER — METHOCARBAMOL 1000 MG/10ML IJ SOLN
500.0000 mg | Freq: Four times a day (QID) | INTRAMUSCULAR | Status: DC | PRN
  Administered 2016-11-01: 500 mg via INTRAVENOUS
  Filled 2016-11-01: qty 5
  Filled 2016-11-01: qty 550

## 2016-11-01 MED ORDER — PROPOFOL 500 MG/50ML IV EMUL
INTRAVENOUS | Status: DC | PRN
  Administered 2016-11-01: 65 ug/kg/min via INTRAVENOUS

## 2016-11-01 MED ORDER — DEXAMETHASONE SODIUM PHOSPHATE 10 MG/ML IJ SOLN
INTRAMUSCULAR | Status: AC
  Filled 2016-11-01: qty 1

## 2016-11-01 MED ORDER — SODIUM CHLORIDE 0.9 % IV SOLN
INTRAVENOUS | Status: DC
  Administered 2016-11-02: 02:00:00 via INTRAVENOUS

## 2016-11-01 MED ORDER — DEXAMETHASONE SODIUM PHOSPHATE 10 MG/ML IJ SOLN
10.0000 mg | Freq: Once | INTRAMUSCULAR | Status: AC
  Administered 2016-11-02: 09:00:00 10 mg via INTRAVENOUS
  Filled 2016-11-01: qty 1

## 2016-11-01 MED ORDER — CEFAZOLIN SODIUM-DEXTROSE 2-4 GM/100ML-% IV SOLN
2.0000 g | INTRAVENOUS | Status: AC
  Administered 2016-11-01: 2 g via INTRAVENOUS
  Filled 2016-11-01: qty 100

## 2016-11-01 MED ORDER — LACTATED RINGERS IV SOLN
INTRAVENOUS | Status: DC
  Administered 2016-11-01: 14:00:00 via INTRAVENOUS
  Administered 2016-11-01: 1000 mL via INTRAVENOUS

## 2016-11-01 MED ORDER — AMLODIPINE BESYLATE 5 MG PO TABS
5.0000 mg | ORAL_TABLET | Freq: Once | ORAL | Status: DC
  Filled 2016-11-01 (×2): qty 1

## 2016-11-01 MED ORDER — OXYCODONE HCL 5 MG PO TABS
5.0000 mg | ORAL_TABLET | ORAL | Status: DC | PRN
  Administered 2016-11-01 – 2016-11-04 (×16): 10 mg via ORAL
  Filled 2016-11-01 (×16): qty 2

## 2016-11-01 MED ORDER — LACTATED RINGERS IV SOLN: INTRAVENOUS | Status: DC

## 2016-11-01 MED ORDER — ONDANSETRON HCL 4 MG PO TABS: 4.0000 mg | ORAL_TABLET | Freq: Four times a day (QID) | ORAL | Status: DC | PRN

## 2016-11-01 MED ORDER — MENTHOL 3 MG MT LOZG: 1.0000 | LOZENGE | OROMUCOSAL | Status: DC | PRN

## 2016-11-01 MED ORDER — SODIUM CHLORIDE 0.9 % IR SOLN
Status: DC | PRN
  Administered 2016-11-01: 1000 mL

## 2016-11-01 MED ORDER — DIPHENHYDRAMINE HCL 12.5 MG/5ML PO ELIX: 12.5000 mg | ORAL_SOLUTION | ORAL | Status: DC | PRN

## 2016-11-01 MED ORDER — BUPIVACAINE LIPOSOME 1.3 % IJ SUSP
20.0000 mL | Freq: Once | INTRAMUSCULAR | Status: DC
  Filled 2016-11-01: qty 20

## 2016-11-01 MED ORDER — TRAMADOL HCL 50 MG PO TABS
50.0000 mg | ORAL_TABLET | Freq: Four times a day (QID) | ORAL | Status: DC | PRN
  Administered 2016-11-01: 21:00:00 100 mg via ORAL
  Filled 2016-11-01: qty 2

## 2016-11-01 SURGICAL SUPPLY — 48 items
BAG SPEC THK2 15X12 ZIP CLS (MISCELLANEOUS) ×1
BAG ZIPLOCK 12X15 (MISCELLANEOUS) ×3 IMPLANT
BANDAGE ACE 6X5 VEL STRL LF (GAUZE/BANDAGES/DRESSINGS) ×3 IMPLANT
BLADE SAG 18X100X1.27 (BLADE) ×3 IMPLANT
BLADE SAW SGTL 11.0X1.19X90.0M (BLADE) ×3 IMPLANT
BOWL SMART MIX CTS (DISPOSABLE) ×3 IMPLANT
CAP KNEE TOTAL 3 SIGMA ×2 IMPLANT
CEMENT HV SMART SET (Cement) ×6 IMPLANT
CLOSURE WOUND 1/2 X4 (GAUZE/BANDAGES/DRESSINGS) ×2
CLOTH BEACON ORANGE TIMEOUT ST (SAFETY) ×3 IMPLANT
CUFF TOURN SGL QUICK 34 (TOURNIQUET CUFF) ×3
CUFF TRNQT CYL 34X4X40X1 (TOURNIQUET CUFF) ×1 IMPLANT
DECANTER SPIKE VIAL GLASS SM (MISCELLANEOUS) ×3 IMPLANT
DRAPE U-SHAPE 47X51 STRL (DRAPES) ×3 IMPLANT
DRSG ADAPTIC 3X8 NADH LF (GAUZE/BANDAGES/DRESSINGS) ×3 IMPLANT
DURAPREP 26ML APPLICATOR (WOUND CARE) ×3 IMPLANT
ELECT REM PT RETURN 9FT ADLT (ELECTROSURGICAL) ×3
ELECTRODE REM PT RTRN 9FT ADLT (ELECTROSURGICAL) ×1 IMPLANT
EVACUATOR 1/8 PVC DRAIN (DRAIN) ×3 IMPLANT
GAUZE SPONGE 4X4 12PLY STRL (GAUZE/BANDAGES/DRESSINGS) ×3 IMPLANT
GLOVE BIO SURGEON STRL SZ7.5 (GLOVE) ×4 IMPLANT
GLOVE BIO SURGEON STRL SZ8 (GLOVE) ×3 IMPLANT
GLOVE BIOGEL PI IND STRL 7.0 (GLOVE) IMPLANT
GLOVE BIOGEL PI IND STRL 7.5 (GLOVE) IMPLANT
GLOVE BIOGEL PI IND STRL 8 (GLOVE) ×1 IMPLANT
GLOVE BIOGEL PI INDICATOR 7.0 (GLOVE) ×6
GLOVE BIOGEL PI INDICATOR 7.5 (GLOVE) ×6
GLOVE BIOGEL PI INDICATOR 8 (GLOVE) ×4
GOWN STRL REUS W/TWL LRG LVL3 (GOWN DISPOSABLE) ×3 IMPLANT
GOWN STRL REUS W/TWL XL LVL3 (GOWN DISPOSABLE) ×6 IMPLANT
HANDPIECE INTERPULSE COAX TIP (DISPOSABLE) ×3
IMMOBILIZER KNEE 20 (SOFTGOODS) ×3
IMMOBILIZER KNEE 20 THIGH 36 (SOFTGOODS) ×1 IMPLANT
MANIFOLD NEPTUNE II (INSTRUMENTS) ×3 IMPLANT
PACK TOTAL KNEE CUSTOM (KITS) ×3 IMPLANT
PAD ABD 8X10 STRL (GAUZE/BANDAGES/DRESSINGS) ×2 IMPLANT
PADDING CAST COTTON 6X4 STRL (CAST SUPPLIES) ×9 IMPLANT
POSITIONER SURGICAL ARM (MISCELLANEOUS) ×3 IMPLANT
SET HNDPC FAN SPRY TIP SCT (DISPOSABLE) ×1 IMPLANT
STRIP CLOSURE SKIN 1/2X4 (GAUZE/BANDAGES/DRESSINGS) ×4 IMPLANT
SUT MNCRL AB 4-0 PS2 18 (SUTURE) ×3 IMPLANT
SUT VIC AB 2-0 CT1 27 (SUTURE) ×9
SUT VIC AB 2-0 CT1 TAPERPNT 27 (SUTURE) ×3 IMPLANT
SUT VLOC 180 0 24IN GS25 (SUTURE) ×3 IMPLANT
SYR 50ML LL SCALE MARK (SYRINGE) ×3 IMPLANT
TRAY FOLEY CATH SILVER 14FR (SET/KITS/TRAYS/PACK) ×2 IMPLANT
WRAP KNEE MAXI GEL POST OP (GAUZE/BANDAGES/DRESSINGS) ×3 IMPLANT
YANKAUER SUCT BULB TIP 10FT TU (MISCELLANEOUS) ×3 IMPLANT

## 2016-11-01 NOTE — Anesthesia Preprocedure Evaluation (Signed)
Anesthesia Evaluation  Patient identified by MRN, date of birth, ID band Patient awake    Reviewed: Allergy & Precautions, NPO status , Patient's Chart, lab work & pertinent test results  Airway Mallampati: I  TM Distance: >3 FB     Dental   Pulmonary    Pulmonary exam normal        Cardiovascular hypertension, Pt. on medications Normal cardiovascular exam     Neuro/Psych Anxiety    GI/Hepatic   Endo/Other    Renal/GU      Musculoskeletal   Abdominal   Peds  Hematology   Anesthesia Other Findings   Reproductive/Obstetrics                             Anesthesia Physical Anesthesia Plan  ASA: I  Anesthesia Plan: Spinal and MAC   Post-op Pain Management:  Regional for Post-op pain   Induction:   Airway Management Planned: Simple Face Mask  Additional Equipment:   Intra-op Plan:   Post-operative Plan:   Informed Consent: I have reviewed the patients History and Physical, chart, labs and discussed the procedure including the risks, benefits and alternatives for the proposed anesthesia with the patient or authorized representative who has indicated his/her understanding and acceptance.     Plan Discussed with: CRNA and Surgeon  Anesthesia Plan Comments:         Anesthesia Quick Evaluation

## 2016-11-01 NOTE — Anesthesia Procedure Notes (Signed)
Anesthesia Regional Block:  Adductor canal block  Pre-Anesthetic Checklist: ,, timeout performed, Correct Patient, Correct Site, Correct Laterality, Correct Procedure, Correct Position, site marked, Risks and benefits discussed,  Surgical consent,  Pre-op evaluation,  At surgeon's request and post-op pain management  Laterality: Left  Prep: chloraprep       Needles:  Injection technique: Single-shot  Needle Type: Echogenic Needle     Needle Length: 9cm 9 cm Needle Gauge: 21 and 21 G    Additional Needles:  Procedures: ultrasound guided (picture in chart) Adductor canal block Narrative:  Start time: 11/01/2016 1:20 PM End time: 11/01/2016 1:30 PM Injection made incrementally with aspirations every 5 mL.  Performed by: Personally  Anesthesiologist: Arta BruceSSEY, Shayma Pfefferle  Additional Notes: Monitors applied. Patient sedated. Sterile prep and drape,hand hygiene and sterile gloves were used. Relevant anatomy identified.Needle position confirmed.Local anesthetic injected incrementally after negative aspiration. Local anesthetic spread visualized around nerve(s). Vascular puncture avoided. No complications. Image printed for medical record.The patient tolerated the procedure well.    Arta BruceKevin Deni Lefever MD

## 2016-11-01 NOTE — Anesthesia Procedure Notes (Signed)
Date/Time: 11/01/2016 1:50 PM Performed by: Delphia GratesHANDLER, Enijah Furr Pre-anesthesia Checklist: Patient identified, Emergency Drugs available, Suction available and Patient being monitored Oxygen Delivery Method: Simple face mask Placement Confirmation: positive ETCO2

## 2016-11-01 NOTE — Transfer of Care (Signed)
Immediate Anesthesia Transfer of Care Note  Patient: Paula Kelley  Procedure(s) Performed: Procedure(s) with comments: LEFT TOTAL KNEE ARTHROPLASTY (Left) - Adductor Block  Patient Location: PACU  Anesthesia Type:MAC, Regional and Spinal  Level of Consciousness: awake, alert , oriented and patient cooperative  Airway & Oxygen Therapy: Patient Spontanous Breathing and Patient connected to face mask oxygen  Post-op Assessment: Report given to RN and Post -op Vital signs reviewed and stable  Post vital signs: Reviewed and stable  Last Vitals:  Vitals:   11/01/16 1051  BP: (!) 140/97  Pulse: 86  Resp: 16  Temp: 37.3 C    Last Pain:  Vitals:   11/01/16 1112  TempSrc:   PainSc: 0-No pain      Patients Stated Pain Goal: 4 (14/97/02 6378)  Complications: No apparent anesthesia complications

## 2016-11-01 NOTE — Anesthesia Postprocedure Evaluation (Signed)
Anesthesia Post Note  Patient: Paula Kelley  Procedure(s) Performed: Procedure(s) (LRB): LEFT TOTAL KNEE ARTHROPLASTY (Left)  Patient location during evaluation: PACU Anesthesia Type: MAC and Spinal Level of consciousness: oriented and awake and alert Pain management: pain level controlled Vital Signs Assessment: post-procedure vital signs reviewed and stable Respiratory status: spontaneous breathing, respiratory function stable and patient connected to nasal cannula oxygen Cardiovascular status: blood pressure returned to baseline and stable Postop Assessment: no headache and no backache Anesthetic complications: no       Last Vitals:  Vitals:   11/01/16 1600 11/01/16 1615  BP: 128/63 133/85  Pulse: 80 80  Resp: 16 13  Temp:      Last Pain:  Vitals:   11/01/16 1112  TempSrc:   PainSc: 0-No pain    LLE Motor Response: Responds to commands (11/01/16 1615) LLE Sensation: Decreased (due to spinal) (11/01/16 1615) RLE Motor Response: Responds to commands (11/01/16 1615) RLE Sensation: Decreased (due to spinal) (11/01/16 1615) L Sensory Level: L5-Outer lower leg, top of foot, great toe (11/01/16 1615) R Sensory Level: S1-Sole of foot, small toes (11/01/16 1615)  Kardell Virgil DAVID

## 2016-11-01 NOTE — Anesthesia Procedure Notes (Signed)
Spinal  Patient location during procedure: OR Start time: 11/01/2016 1:50 PM End time: 11/01/2016 1:55 PM Staffing Anesthesiologist: Arta BruceSSEY, Bohdan Macho Performed: anesthesiologist  Preanesthetic Checklist Completed: patient identified, surgical consent, pre-op evaluation, timeout performed, IV checked, risks and benefits discussed and monitors and equipment checked Spinal Block Patient position: sitting Prep: Betadine Patient monitoring: heart rate, cardiac monitor, continuous pulse ox and blood pressure Approach: right paramedian Location: L3-4 Injection technique: single-shot Needle Needle type: Pencan  Needle gauge: 24 G Needle length: 9 cm Needle insertion depth: 8 cm

## 2016-11-01 NOTE — Progress Notes (Signed)
AssistedDr. Ossey with left, ultrasound guided, adductor canal block. Side rails up, monitors on throughout procedure. See vital signs in flow sheet. Tolerated Procedure well.  

## 2016-11-01 NOTE — Op Note (Signed)
OPERATIVE REPORT-TOTAL KNEE ARTHROPLASTY   Pre-operative diagnosis- Osteoarthritis  Left knee(s)  Post-operative diagnosis- Osteoarthritis Left knee(s)  Procedure-  Left  Total Knee Arthroplasty  Surgeon- Gus Rankin. Sohaib Vereen, MD  Assistant- Avel Peace, PA-C   Anesthesia-  Adductor canal block and spinal  EBL-* No blood loss amount entered *   Drains Hemovac  Tourniquet time- 35 minutes @ 300 mm Hg  Complications- None  Condition-PACU - hemodynamically stable.   Brief Clinical Note  Paula Kelley is a 66 y.o. year old female with end stage OA of her left knee with progressively worsening pain and dysfunction. She has constant pain, with activity and at rest and significant functional deficits with difficulties even with ADLs. She has had extensive non-op management including analgesics, injections of cortisone and viscosupplements, and home exercise program, but remains in significant pain with significant dysfunction. Radiographs show bone on bone arthritis lateral and patellofemoral with large valgus deformity. She presents now for left Total Knee Arthroplasty.    Procedure in detail---   The patient is brought into the operating room and positioned supine on the operating table. After successful administration of Adductor canal block and spinal ,   a tourniquet is placed high on the  Left thigh(s) and the lower extremity is prepped and draped in the usual sterile fashion. Time out is performed by the operating team and then the  Left lower extremity is wrapped in Esmarch, knee flexed and the tourniquet inflated to 300 mmHg.       A midline incision is made with a ten blade through the subcutaneous tissue to the level of the extensor mechanism. A fresh blade is used to make a medial parapatellar arthrotomy. Soft tissue over the proximal medial tibia is subperiosteally elevated to the joint line with a knife and into the semimembranosus bursa with a Cobb elevator. Soft tissue over  the proximal lateral tibia is elevated with attention being paid to avoiding the patellar tendon on the tibial tubercle. The patella is everted, knee flexed 90 degrees and the ACL and PCL are removed. Findings are bone on bone lateral and patellofemoral with large global osteophytes.        The drill is used to create a starting hole in the distal femur and the canal is thoroughly irrigated with sterile saline to remove the fatty contents. The 5 degree Left  valgus alignment guide is placed into the femoral canal and the distal femoral cutting block is pinned to remove 10 mm off the distal femur. Resection is made with an oscillating saw.      The tibia is subluxed forward and the menisci are removed. The extramedullary alignment guide is placed referencing proximally at the medial aspect of the tibial tubercle and distally along the second metatarsal axis and tibial crest. The block is pinned to remove 2mm off the more deficient lateral  side. Resection is made with an oscillating saw. Size 2.5is the most appropriate size for the tibia and the proximal tibia is prepared with the modular drill and keel punch for that size.      The femoral sizing guide is placed and size 2.5 is most appropriate. Rotation is marked off the epicondylar axis and confirmed by creating a rectangular flexion gap at 90 degrees. The size 2.5 cutting block is pinned in this rotation and the anterior, posterior and chamfer cuts are made with the oscillating saw. The intercondylar block is then placed and that cut is made.  Trial size 2.5 tibial component, trial size 2.5 posterior stabilized femur and a 12.5  mm posterior stabilized rotating platform insert trial is placed. Full extension is achieved with excellent varus/valgus and anterior/posterior balance throughout full range of motion. The patella is everted and thickness measured to be 22  mm. Free hand resection is taken to 12 mm, a 35 template is placed, lug holes are drilled,  trial patella is placed, and it tracks normally. Osteophytes are removed off the posterior femur with the trial in place. All trials are removed and the cut bone surfaces prepared with pulsatile lavage. Cement is mixed and once ready for implantation, the size 2.5 tibial implant, size  2.5 posterior stabilized femoral component, and the size 35 patella are cemented in place and the patella is held with the clamp. The trial insert is placed and the knee held in full extension. The Exparel (20 ml mixed with 30 ml saline) and .25% Bupivicaine, are injected into the extensor mechanism, posterior capsule, medial and lateral gutters and subcutaneous tissues.  All extruded cement is removed and once the cement is hard the permanent 12.5 mm posterior stabilized rotating platform insert is placed into the tibial tray.      The wound is copiously irrigated with saline solution and the extensor mechanism closed over a hemovac drain with #1 V-loc suture. The tourniquet is released for a total tourniquet time of 35  minutes. Flexion against gravity is 140 degrees and the patella tracks normally. Subcutaneous tissue is closed with 2.0 vicryl and subcuticular with running 4.0 Monocryl. The incision is cleaned and dried and steri-strips and a bulky sterile dressing are applied. The limb is placed into a knee immobilizer and the patient is awakened and transported to recovery in stable condition.      Please note that a surgical assistant was a medical necessity for this procedure in order to perform it in a safe and expeditious manner. Surgical assistant was necessary to retract the ligaments and vital neurovascular structures to prevent injury to them and also necessary for proper positioning of the limb to allow for anatomic placement of the prosthesis.   Gus RankinFrank V. Hydia Copelin, MD    11/01/2016, 3:03 PM

## 2016-11-01 NOTE — H&P (View-Only) (Signed)
Paula Kelley DOB: 08/29/1951 Married / Language: [place]; Flemish / Race: Black or African American Female Date of Admission:  11/01/2016 CC: Left Knee Pain History of Present Illness The patient is a 66 year old female who comes in  for a preoperative History and Physical. The patient is scheduled for a left total knee arthroplasty to be performed by Dr. Gus Rankin. Aluisio, MD at [facility] on 11-01-2016. The patient is a 66 year old female who presents with knee complaints. The patient reports right knee symptoms including: pain, swelling and locking (and popping) which began year(s) ago without any known injury. The patient describes the severity of the symptoms as moderate in severity. The patient feels that the symptoms are worsening. Past treatment for this problem has included knee brace and nonsteroidal anti-inflammatory drugs. Onset of symptoms was gradual. Symptoms are exacerbated by walking (long distances) and squatting. Symptoms are relieved by rest, while symptoms are not relieved by ice. Current treatment includes nonsteroidal anti-inflammatory drugs. Pertinent medical history includes osteoarthritis. Note for "Knee pain": She has been told in the past she has DJD. She does have knee sleeves which she has tried in the past with varied success, sometimes they feel too tight and sometimes they don't stay on. She does actually have hinged braces as well. She enjoys walking for exercise and tries to walk 2-3 miles several times per week but has had to decrease some due to knee pain. She notes stiffness afterwards, especially the next day. Stiffness in AM and after sitting. Unfortunately, both knees are giving her a lot of trouble. It is becoming increasingly difficult for her to get around. She cannot do stairs anymore. She in fact is hoping to be able to acquire a stair lift that will be able to take her up the steps. She no longer feels safe doing that activity. She is hurting pretty  much with all activities. She even has pain at rest. She can usually sleep at night but occasionally does get pain. Left knee is more symptomatic than the right. She has had injections in the past, which have not provided any benefit. She is ready to get the knee fixed. They have been treated conservatively in the past for the above stated problem and despite conservative measures, they continue to have progressive pain and severe functional limitations and dysfunction. They have failed non-operative management including home exercise, medications, and injections. It is felt that they would benefit from undergoing total joint replacement. Risks and benefits of the procedure have been discussed with the patient and they elect to proceed with surgery. There are no active contraindications to surgery such as ongoing infection or rapidly progressive neurological disease.  Problem List/Past Medical  Primary osteoarthritis of both knees (M17.0)  Disorder, shoulder region NEC (726.2) [11/18/2005]: High blood pressure  Varicose veins  Menopause   Allergies  Sulfa Drugs   Family History Heart Disease  mother and father Heart disease in female family member before age 9   Social History Alcohol use  current drinker; drinks wine; only occasionally per week Children  2 Current work status  working full time Drug/Alcohol Rehab (Currently)  no Exercise  Exercises weekly; does running / walking Illicit drug use  no Living situation  live with spouse Marital status  married Most recent primary occupation  Scientist, clinical (histocompatibility and immunogenetics) Pain Contract  no Previously in rehab  no Tobacco / smoke exposure  no Tobacco use  never smoker [employer]chant [employer] officer  Living Will  Medication  History  Cholecalciferol (1000UNIT Tablet, Oral) Active. Fish Oil (1000MG  Capsule, Oral) Active. Calcium Carbonate (600MG  Tablet, Oral) Active. Xyzal (2.5MG /5ML Solution, Oral) Active. Amlodipine Besy-Benazepril  HCl (5-10MG  Capsule, Oral) Active. Fluticasone Propionate (50MCG/ACT Suspension, Nasal) Active. Aspirin (81MG  Tablet DR, Oral) Active.   Past Surgical History No pertinent past surgical history    Review of Systems General Not Present- Chills, Fatigue, Fever, Memory Loss, Night Sweats, Weight Gain and Weight Loss. Skin Not Present- Eczema, Hives, Itching, Lesions and Rash. HEENT Not Present- Dentures, Double Vision, Headache, Hearing Loss, Tinnitus and Visual Loss. Respiratory Not Present- Allergies, Chronic Cough, Coughing up blood, Shortness of breath at rest and Shortness of breath with exertion. Cardiovascular Not Present- Chest Pain, Difficulty Breathing Lying Down, Murmur, Palpitations, Racing/skipping heartbeats and Swelling. Gastrointestinal Not Present- Abdominal Pain, Bloody Stool, Constipation, Diarrhea, Difficulty Swallowing, Heartburn, Jaundice, Loss of appetitie, Nausea and Vomiting. Female Genitourinary Not Present- Blood in Urine, Discharge, Flank Pain, Incontinence, Painful Urination, Urgency, Urinary frequency, Urinary Retention, Urinating at Night and Weak urinary stream. Musculoskeletal Present- Joint Pain, Joint Swelling and Morning Stiffness. Not Present- Back Pain, Muscle Pain, Muscle Weakness and Spasms. Neurological Not Present- Blackout spells, Difficulty with balance, Dizziness, Paralysis, Tremor and Weakness. Psychiatric Not Present- Insomnia.  Vitals Weight: 175 lb Height: 64in Weight was reported by patient. Height was reported by patient. Body Surface Area: 1.85 m Body Mass Index: 30.04 kg/m  Pulse: 76 (Regular)  BP: 144/82 (Sitting, Right Arm, Standard)   Physical Exam  General Mental Status -Alert, cooperative and good historian. General Appearance-pleasant, Not in acute distress. Orientation-Oriented X3. Build & Nutrition-Well nourished and Well developed.  Head and Neck Head-normocephalic, atraumatic . Neck Global  Assessment - supple, no bruit auscultated on the right, no bruit auscultated on the left.  Eye Vision-Wears corrective lenses. Pupil - Bilateral-Regular and Round. Motion - Bilateral-EOMI.  Chest and Lung Exam Auscultation Breath sounds - clear at anterior chest wall and clear at posterior chest wall. Adventitious sounds - No Adventitious sounds.  Cardiovascular Auscultation Rhythm - Regular rate and rhythm. Heart Sounds - S1 WNL and S2 WNL. Murmurs & Other Heart Sounds - Auscultation of the heart reveals - No Murmurs.  Abdomen Palpation/Percussion Tenderness - Abdomen is non-tender to palpation. Rigidity (guarding) - Abdomen is soft. Auscultation Auscultation of the abdomen reveals - Bowel sounds normal.  Female Genitourinary Note: Not done, not pertinent to present illness   Musculoskeletal Note: On exam, she is in no distress. Her hips show normal range of motion with no discomfort. Left knee, slight valgus, range about 5 to 125, tender lateral and medial. There is no instability noted. Right knee also has valgus deformity, range 0 to 130. Moderate crepitus on range of motion. Tender medial greater than lateral with no instability noted. Pulse, sensation and motor are intact in both lower extremities.  RADIOGRAPHS AP and lateral of both knees show that she has bone on bone arthritis in the lateral and patellofemoral compartments of both knees, left a little worse than the right with valgus deformity bilaterally.  Assessment & Plan Primary osteoarthritis of right knee (M17.11) Primary osteoarthritis of left knee (M17.12)  Note:Surgical Plans: Left Total Knee Replacement  Disposition: Home  PCP: Dr. Kriste BasqueHannah Kim  IV TXA  Anesthesia Issues: None  Signed electronically by Beckey RutterAlezandrew L Perkins, III PA-C

## 2016-11-01 NOTE — Interval H&P Note (Signed)
History and Physical Interval Note:  11/01/2016 1:04 PM  Paula Kelley  has presented today for surgery, with the diagnosis of LEFT KNEE OA  The various methods of treatment have been discussed with the patient and family. After consideration of risks, benefits and other options for treatment, the patient has consented to  Procedure(s): LEFT TOTAL KNEE ARTHROPLASTY (Left) as a surgical intervention .  The patient's history has been reviewed, patient examined, no change in status, stable for surgery.  I have reviewed the patient's chart and labs.  Questions were answered to the patient's satisfaction.     Loanne DrillingALUISIO,Toussaint Golson V

## 2016-11-02 LAB — CBC
HCT: 31.8 % — ABNORMAL LOW (ref 36.0–46.0)
Hemoglobin: 10.5 g/dL — ABNORMAL LOW (ref 12.0–15.0)
MCH: 29.1 pg (ref 26.0–34.0)
MCHC: 33 g/dL (ref 30.0–36.0)
MCV: 88.1 fL (ref 78.0–100.0)
PLATELETS: 257 10*3/uL (ref 150–400)
RBC: 3.61 MIL/uL — ABNORMAL LOW (ref 3.87–5.11)
RDW: 13.2 % (ref 11.5–15.5)
WBC: 10.7 10*3/uL — ABNORMAL HIGH (ref 4.0–10.5)

## 2016-11-02 LAB — BASIC METABOLIC PANEL
Anion gap: 6 (ref 5–15)
BUN: 12 mg/dL (ref 6–20)
CALCIUM: 8.5 mg/dL — AB (ref 8.9–10.3)
CO2: 26 mmol/L (ref 22–32)
CREATININE: 0.64 mg/dL (ref 0.44–1.00)
Chloride: 102 mmol/L (ref 101–111)
GFR calc Af Amer: >60 mL/min (ref >60)
GLUCOSE: 127 mg/dL — AB (ref 65–99)
Potassium: 3.9 mmol/L (ref 3.5–5.1)
Sodium: 134 mmol/L — ABNORMAL LOW (ref 135–145)

## 2016-11-02 MED ORDER — SODIUM CHLORIDE 0.9 % IV BOLUS (SEPSIS)
500.0000 mL | Freq: Once | INTRAVENOUS | Status: AC
  Administered 2016-11-02: 500 mL via INTRAVENOUS

## 2016-11-02 MED ORDER — OXYCODONE HCL 5 MG PO TABS: 5.0000 mg | ORAL_TABLET | ORAL | 0 refills | Status: DC | PRN

## 2016-11-02 MED ORDER — RIVAROXABAN 10 MG PO TABS
10.0000 mg | ORAL_TABLET | Freq: Every day | ORAL | 0 refills | Status: DC
Start: 2016-11-03 — End: 2017-01-21

## 2016-11-02 MED ORDER — TRAMADOL HCL 50 MG PO TABS: 50.0000 mg | ORAL_TABLET | Freq: Four times a day (QID) | ORAL | 0 refills | Status: DC | PRN

## 2016-11-02 MED ORDER — METHOCARBAMOL 500 MG PO TABS: 500.0000 mg | ORAL_TABLET | Freq: Four times a day (QID) | ORAL | 0 refills | Status: DC | PRN

## 2016-11-02 NOTE — Evaluation (Signed)
Occupational Therapy Evaluation Patient Details Name: Paula Kelley MRN: [id number] DOB: June 28, 1951 Today's Date: 11/02/2016    History of Present Illness 66 yo female s/p L TKA 11/01/16. Hx of chronic R knee pain-will need surgery per pt.    Clinical Impression   Pt admitted as above now s/p L TKR. She is currently supervision-Min guard assist for transfers and Min A LB ADL's. She has some DME, but will benefit from a 3:1 for home use. She will have family assistance PRN at d/c per her report and both she/her daughter deny further acute OT needs at this time. Will sign off OT.    Follow Up Recommendations  No OT follow up;Supervision - Intermittent    Equipment Recommendations  3 in 1 bedside commode;Other (comment) (Long handled A/E as needed.)    Recommendations for Other Services       Precautions / Restrictions Precautions Precautions: Fall Required Braces or Orthoses: Knee Immobilizer - Left Knee Immobilizer - Left: Discontinue once straight leg raise with < 10 degree lag Restrictions Weight Bearing Restrictions: No LLE Weight Bearing: Weight bearing as tolerated      Mobility Bed Mobility Overal bed mobility:  (Pt sitting up in chair upon OT arrival) Bed Mobility: Supine to Sit     Supine to sit: Min assist     General bed mobility comments: Assist for L LE. Cues for safety, technique.   Transfers Overall transfer level: Needs assistance Equipment used: Rolling walker (2 wheeled) Transfers: Sit to/from UGI CorporationStand;Stand Pivot Transfers Sit to Stand: Min guard Stand pivot transfers: Supervision;Min guard       General transfer comment: VCs safety, technique, hand/LE placement. Increased time.     Balance Overall balance assessment: No apparent balance deficits (not formally assessed)                                          ADL Overall ADL's : Needs assistance/impaired Eating/Feeding: Independent;Sitting;Set up   Grooming: Wash/dry  hands;Sitting;Set up   Upper Body Bathing: Set up;Sitting   Lower Body Bathing: Min guard;Sit to/from stand;Set up   Upper Body Dressing : Set up;Sitting   Lower Body Dressing: Min guard;Set up;Sit to/from stand   Toilet Transfer: Supervision/safety;RW;Ambulation;Comfort height toilet;Grab bars;Min guard   Toileting- Clothing Manipulation and Hygiene: Supervision/safety;Sit to/from stand;Sitting/lateral lean   Tub/ Shower Transfer: Walk-in Best boyshower;Minimal assistance Tub/Shower Transfer Details (indicate cue type and reason): Discussed shower transfer. Pt reports that she plans to initially sponge bathe and will have PRN assist from spouse/family after discharge. Functional mobility during ADLs: Supervision/safety;Min guard;Caregiver able to provide necessary level of assistance;Rolling walker General ADL Comments: Pt/daughter were educated in role of OT; discussed ADL's, shower transfers and LH A/E as option for use after discharge to assist with increasing independence with ADL's. Pt should benefit from 3:1 for downstairs bathroom. Pt/daughter states that she will have necessary assist at home from family and denies further need from acute OT at this time. Will sign off.     Vision  No change from baseline.   Perception     Praxis      Pertinent Vitals/Pain Pain Assessment: 0-10 Pain Score: 3  Pain Location: L knee-posterior Pain Descriptors / Indicators: Aching Pain Intervention(s): Limited activity within patient's tolerance;Repositioned;Monitored during session;Ice applied     Hand Dominance Right   Extremity/Trunk Assessment Upper Extremity Assessment Upper Extremity Assessment: Overall WFL for  tasks assessed   Lower Extremity Assessment Lower Extremity Assessment: Defer to PT evaluation   Cervical / Trunk Assessment Cervical / Trunk Assessment: Normal   Communication Communication Communication: No difficulties   Cognition Arousal/Alertness:  Awake/alert Behavior During Therapy: WFL for tasks assessed/performed Overall Cognitive Status: Within Functional Limits for tasks assessed                     General Comments       Exercises       Shoulder Instructions      Home Living Family/patient expects to be discharged to:: Private residence Living Arrangements: Spouse/significant other Available Help at Discharge: Family Type of Home: House Home Access: Stairs to enter Secretary/administrator of Steps: 1 Entrance Stairs-Rails: None Home Layout: Two level;Able to live on main level with bedroom/bathroom     Bathroom Shower/Tub: Producer, television/film/video: Standard (Elevated toilet seat upstairs)     Home Equipment: Walker - 2 wheels;Walker - 4 wheels;Cane - single point;Crutches;Shower seat - built in (Chair lift to second floor)   Additional Comments: Pt is interested in 3n1 for downstairs bathroom      Prior Functioning/Environment Level of Independence: Independent                 OT Problem List:     OT Treatment/Interventions:      OT Goals(Current goals can be found in the care plan section) Acute Rehab OT Goals Patient Stated Goal: Get knee positioned more comfortable and go home OT Goal Formulation: All assessment and education complete, DC therapy  OT Frequency:     Barriers to D/C:            Co-evaluation              End of Session Equipment Utilized During Treatment: Gait belt;Rolling walker;Left knee immobilizer CPM Left Knee CPM Left Knee: Off Additional Comments: 4 hours per day Nurse Communication: Other (comment) (BP before and after treatment session)  Activity Tolerance: Patient tolerated treatment well;No increased pain Patient left: in chair;with call bell/phone within reach;with family/visitor present   Time: 1610-9604 OT Time Calculation (min): 37 min Charges:  OT General Charges $OT Visit: 1 Procedure OT Evaluation $OT Eval Low Complexity: 1  Procedure OT Treatments $Self Care/Home Management : 8-22 mins G-Codes:    Roselie Awkward Dixon, OTR/L 11/02/2016, 12:00 PM

## 2016-11-02 NOTE — Care Management Note (Signed)
Case Management Note  Patient Details  Name: Paula Kelley MRN: [id number] Date of Birth: 11/06/1950  Subjective/Objective:                  Left  Total Knee Arthroplasty Action/Plan: Discharge planning Expected Discharge Date:  11/03/16               Expected Discharge Plan:  [place]  In-House Referral:     Discharge planning Services  CM Consult  Post Acute Care Choice:  Home Health Choice offered to:  Patient  DME Arranged:  3-N-1 DME Agency:  [place]:  PT [place] Agency:  [facility] ([facility]  Status of Service:  Completed, signed off  If discussed at [employer], dates discussed:    Additional Comments: CM met with pt in room to offer choice of home health agency. Pt chooses [facility] to render HHPT. Referral called to [facility] rep, Tim. Pt states she has ll DME needed at home. No other CM needs were communicated. Dellie Catholic, RN 11/02/2016, 12:58 PM

## 2016-11-02 NOTE — Progress Notes (Signed)
   Subjective: 1 Day Post-Op Procedure(s) (LRB): LEFT TOTAL KNEE ARTHROPLASTY (Left) Patient reports pain as moderate.   Patient seen in rounds for Dr. Lequita HaltAluisio.  Tough night. Patient is having problems with pain in the knee, requiring pain medications We will start therapy today.  Plan is to go Home after hospital stay.  Objective: Vital signs in last 24 hours: Temp:  [97.8 F (36.6 C)-98.8 F (37.1 C)] 98.1 F (36.7 C) (01/16 1425) Pulse Rate:  [56-81] 81 (01/16 1425) Resp:  [15-18] 18 (01/16 1425) BP: (73-117)/(40-79) 115/65 (01/16 1425) SpO2:  [95 %-100 %] 95 % (01/16 1425)  Intake/Output from previous day:  Intake/Output Summary (Last 24 hours) at 11/02/16 2119 Last data filed at 11/02/16 1858  Gross per 24 hour  Intake             1700 ml  Output             4180 ml  Net            -2480 ml    Intake/Output this shift: No intake/output data recorded.  Labs:  Recent Labs  11/02/16 0428  HGB 10.5*    Recent Labs  11/02/16 0428  WBC 10.7*  RBC 3.61*  HCT 31.8*  PLT 257    Recent Labs  11/02/16 0428  NA 134*  K 3.9  CL 102  CO2 26  BUN 12  CREATININE 0.64  GLUCOSE 127*  CALCIUM 8.5*   No results for input(s): LABPT, INR in the last 72 hours.  EXAM General - Patient is Alert, Appropriate and Oriented Extremity - Neurovascular intact Sensation intact distally Intact pulses distally Dorsiflexion/Plantar flexion intact Dressing - dressing C/D/I Motor Function - intact, moving foot and toes well on exam.  Hemovac pulled without difficulty.  Past Medical History:  Diagnosis Date  . Allergic rhinitis   . Anxiety   . DJD (degenerative joint disease)    Sees Dr. Dierdre ForthBeekman  . Headache(784.0)   . Hypertension   . Mild anemia    iron def, reports on iron her whole life  . Obesity   . URI (upper respiratory infection)   . UTI (lower urinary tract infection)     Assessment/Plan: 1 Day Post-Op Procedure(s) (LRB): LEFT TOTAL KNEE ARTHROPLASTY  (Left) Principal Problem:   OA (osteoarthritis) of knee  Estimated body mass index is 30.9 kg/m as calculated from the following:   Height as of this encounter: 5\' 4"  (1.626 m).   Weight as of this encounter: 81.6 kg (180 lb). Up with therapy Plan for discharge tomorrow  DVT Prophylaxis - Xarelto Weight-Bearing as tolerated to left leg D/C O2 and Pulse OX and try on Room Air  Avel Peacerew Eann Cleland, PA-C Orthopaedic Surgery 11/02/2016, 9:19 PM
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
## 2016-11-02 NOTE — Progress Notes (Signed)
Physical Therapy Treatment Patient Details Name: Paula Kelley MRN: [id number] DOB: 02-09-1951 Today's Date: 11/02/2016    History of Present Illness 66 yo female s/p L TKA 11/01/16. Hx of chronic R knee pain-will need surgery per pt.     PT Comments    Improved activity tolerance this session. Will continue to progress activity.   Follow Up Recommendations  Home health PT;Supervision/Assistance - 24 hour     Equipment Recommendations  None recommended by PT    Recommendations for Other Services       Precautions / Restrictions Precautions Precautions: Fall Required Braces or Orthoses: Knee Immobilizer - Left Knee Immobilizer - Left: Discontinue once straight leg raise with < 10 degree lag Restrictions Weight Bearing Restrictions: No LLE Weight Bearing: Weight bearing as tolerated    Mobility  Bed Mobility Overal bed mobility: Needs Assistance Bed Mobility: Sit to Supine       Sit to supine: Min assist   General bed mobility comments: small amount of assist for L LE.  Transfers Overall transfer level: Needs assistance Equipment used: Rolling walker (2 wheeled) Transfers: Sit to/from Stand Sit to Stand: Min guard Stand pivot transfers: Supervision;Min guard       General transfer comment: VCs safety, technique, hand/LE placement. close guard for safety.   Ambulation/Gait Ambulation/Gait assistance: Min guard Ambulation Distance (Feet): 65 Feet Assistive device: Rolling walker (2 wheeled) Gait Pattern/deviations: Step-to pattern     General Gait Details: VCs safety, sequence, distance from walker. Improved activity tolerance.    Stairs            Wheelchair Mobility    Modified Rankin (Stroke Patients Only)       Balance Overall balance assessment: No apparent balance deficits (not formally assessed)                                  Cognition Arousal/Alertness: Awake/alert Behavior During Therapy: WFL for tasks  assessed/performed Overall Cognitive Status: Within Functional Limits for tasks assessed                      Exercises      General Comments        Pertinent Vitals/Pain Pain Assessment: 0-10 Pain Score: 5  Pain Location: L knee-posterior Pain Descriptors / Indicators: Aching Pain Intervention(s): Monitored during session;Repositioned    Home Living Family/patient expects to be discharged to:: Private residence Living Arrangements: Spouse/significant other Available Help at Discharge: Family Type of Home: House Home Access: Stairs to enter Entrance Stairs-Rails: None Home Layout: Two level;Able to live on main level with bedroom/bathroom Home Equipment: Dan Humphreys - 2 wheels;Walker - 4 wheels;Cane - single point;Crutches;Shower seat - built in (Chair lift to second floor) Additional Comments: Pt is interested in 3n1 for downstairs bathroom    Prior Function Level of Independence: Independent          PT Goals (current goals can now be found in the care plan section) Acute Rehab PT Goals Patient Stated Goal: Get knee positioned more comfortable and go home Progress towards PT goals: Progressing toward goals    Frequency    7X/week      PT Plan Current plan remains appropriate    Co-evaluation             End of Session Equipment Utilized During Treatment: Gait belt;Left knee immobilizer Activity Tolerance: Patient tolerated treatment well Patient left: in bed;with call bell/phone  within reach;with family/visitor present     Time: 1353-1411 PT Time Calculation (min) (ACUTE ONLY): 18 min  Charges:  $Gait Training: 8-22 mins                    G Codes:      Rebeca AlertJannie Zykia Walla, MPT Pager: [phone]

## 2016-11-02 NOTE — Evaluation (Signed)
Physical Therapy Evaluation Patient Details Name: Leoda Smithhart Tango MRN: [id number] DOB: 1951-10-01 Today's Date: 11/02/2016   History of Present Illness  66 yo female s/p L TKA 11/01/16. Hx of chronic R knee pain-will need surgery per pt.   Clinical Impression  On eval, pt required Min assist for mobility. She walked ~65 feet with a RW. Moderate pain with activity. Pt c/o some lightheadedness and she fatigues fairly easily with activity. Will follow and progress activity as tolerated.     Follow Up Recommendations Home health PT;Supervision/Assistance - 24 hour    Equipment Recommendations  None recommended by PT    Recommendations for Other Services       Precautions / Restrictions Precautions Precautions: Fall Required Braces or Orthoses: Knee Immobilizer - Left Knee Immobilizer - Left: Discontinue once straight leg raise with < 10 degree lag Restrictions Weight Bearing Restrictions: No LLE Weight Bearing: Weight bearing as tolerated      Mobility  Bed Mobility Overal bed mobility: Needs Assistance Bed Mobility: Supine to Sit     Supine to sit: Min assist     General bed mobility comments: Assist for L LE. Cues for safety, technique.   Transfers Overall transfer level: Needs assistance Equipment used: Rolling walker (2 wheeled) Transfers: Sit to/from Stand Sit to Stand: Min assist;From elevated surface         General transfer comment: VCs safety, technique, hand/LE placement. Increased time.   Ambulation/Gait Ambulation/Gait assistance: Min assist Ambulation Distance (Feet): 65 Feet Assistive device: Rolling walker (2 wheeled) Gait Pattern/deviations: Step-to pattern     General Gait Details: VCs safety, sequence, distance from walker. Pt c/o mild lightheadedness. Pt fatigues fairly easily.   Stairs            Wheelchair Mobility    Modified Rankin (Stroke Patients Only)       Balance                                            Pertinent Vitals/Pain Pain Assessment: 0-10 Pain Score: 7  Pain Location: L knee-posterior Pain Descriptors / Indicators: Aching;Sore Pain Intervention(s): Limited activity within patient's tolerance;Repositioned;Ice applied    Home Living Family/patient expects to be discharged to:: Private residence Living Arrangements: Spouse/significant other Available Help at Discharge: Family Type of Home: House Home Access: Stairs to enter Entrance Stairs-Rails: None Entrance Stairs-Number of Steps: 1 Home Layout: Two level;Able to live on main level with bedroom/bathroom Home Equipment: Dan Humphreys - 2 wheels;Walker - 4 wheels;Cane - single point;Crutches (chair lift to 2nd floor) Additional Comments: Pt is interested in 3n1 for downstairs bathroom    Prior Function Level of Independence: Independent               Hand Dominance        Extremity/Trunk Assessment   Upper Extremity Assessment Upper Extremity Assessment: Defer to OT evaluation    Lower Extremity Assessment Lower Extremity Assessment: Generalized weakness (s/p L TKA)    Cervical / Trunk Assessment Cervical / Trunk Assessment: Normal  Communication   Communication: No difficulties  Cognition Arousal/Alertness: Awake/alert Behavior During Therapy: WFL for tasks assessed/performed Overall Cognitive Status: Within Functional Limits for tasks assessed                      General Comments      Exercises Total Joint Exercises Ankle Circles/Pumps: AROM;Both;Supine;15 reps  Quad Sets: AROM;Both;10 reps;Supine Heel Slides: AAROM;Left;10 reps;Supine Hip ABduction/ADduction: AAROM;Left;10 reps;Supine Straight Leg Raises: AAROM;Left;10 reps;Supine Goniometric ROM: ~10-60 degrees   Assessment/Plan    PT Assessment Patient needs continued PT services  PT Problem List Decreased strength;Decreased mobility;Decreased range of motion;Decreased activity tolerance;Decreased balance;Decreased knowledge of use  of DME;Pain          PT Treatment Interventions DME instruction;Therapeutic activities;Gait training;Therapeutic exercise;Patient/family education;Functional mobility training;Balance training;Stair training    PT Goals (Current goals can be found in the Care Plan section)  Acute Rehab PT Goals Patient Stated Goal: less pain PT Goal Formulation: With patient/family Time For Goal Achievement: 11/16/16 Potential to Achieve Goals: Good    Frequency 7X/week   Barriers to discharge        Co-evaluation               End of Session Equipment Utilized During Treatment: Gait belt;Left knee immobilizer Activity Tolerance: Patient tolerated treatment well Patient left: in chair;with call bell/phone within reach;with family/visitor present           Time: 0942-1010 PT Time Calculation (min) (ACUTE ONLY): 28 min   Charges:   PT Evaluation $PT Eval Low Complexity: 1 Procedure PT Treatments $Gait Training: 8-22 mins   PT G Codes:        Rebeca AlertJannie Christoper Bushey, MPT Pager: [phone]

## 2016-11-02 NOTE — Discharge Instructions (Addendum)
° °Dr. Frank Aluisio °Total Joint Specialist °[facility] °[address] °[place], [place] [address] °[phone] ° °TOTAL KNEE REPLACEMENT POSTOPERATIVE DIRECTIONS ° °Knee Rehabilitation, Guidelines Following Surgery  °Results after knee surgery are often greatly improved when you follow the exercise, range of motion and muscle strengthening exercises prescribed by your doctor. Safety measures are also important to protect the knee from further injury. Any time any of these exercises cause you to have increased pain or swelling in your knee joint, decrease the amount until you are comfortable again and slowly increase them. If you have problems or questions, call your caregiver or physical therapist for advice.  ° °HOME CARE INSTRUCTIONS  °Remove items at home which could result in a fall. This includes throw rugs or furniture in walking pathways.  °· ICE to the affected knee every three hours for 30 minutes at a time and then as needed for pain and swelling.  Continue to use ice on the knee for pain and swelling from surgery. You may notice swelling that will progress down to the foot and ankle.  This is normal after surgery.  Elevate the leg when you are not up walking on it.   °· Continue to use the breathing machine which will help keep your temperature down.  It is common for your temperature to cycle up and down following surgery, especially at night when you are not up moving around and exerting yourself.  The breathing machine keeps your lungs expanded and your temperature down. °· Do not place pillow under knee, focus on keeping the knee straight while resting ° °DIET °You may resume your previous home diet once your are discharged from the hospital. ° °DRESSING / WOUND CARE / SHOWERING °You may shower 3 days after surgery, but keep the wounds dry during showering.  You may use an occlusive plastic wrap (Press'n Seal for example), NO SOAKING/SUBMERGING IN THE BATHTUB.  If the  bandage gets wet, change with a clean dry gauze.  If the incision gets wet, pat the wound dry with a clean towel. °You may start showering once you are discharged home but do not submerge the incision under water. Just pat the incision dry and apply a dry gauze dressing on daily. °Change the surgical dressing daily and reapply a dry dressing each time. ° °ACTIVITY °Walk with your walker as instructed. °Use walker as long as suggested by your caregivers. °Avoid periods of inactivity such as sitting longer than an hour when not asleep. This helps prevent blood clots.  °You may resume a sexual relationship in one month or when given the OK by your doctor.  °You may return to work once you are cleared by your doctor.  °Do not drive a car for 6 weeks or until released by you surgeon.  °Do not drive while taking narcotics. ° °WEIGHT BEARING °Weight bearing as tolerated with assist device (walker, cane, etc) as directed, use it as long as suggested by your surgeon or therapist, typically at least 4-6 weeks. ° °POSTOPERATIVE CONSTIPATION PROTOCOL °Constipation - defined medically as fewer than three stools per week and severe constipation as less than one stool per week. ° °One of the most common issues patients have following surgery is constipation.  Even if you have a regular bowel pattern at home, your normal regimen is likely to be disrupted due to multiple reasons following surgery.  Combination of anesthesia, postoperative narcotics, change in appetite and fluid intake all can affect your bowels.    In order to avoid complications following surgery, here are some recommendations in order to help you during your recovery period. ° °Colace (docusate) - Pick up an over-the-counter form of Colace or another stool softener and take twice a day as long as you are requiring postoperative pain medications.  Take with a full glass of water daily.  If you experience loose stools or diarrhea, hold the colace until you stool forms  back up.  If your symptoms do not get better within 1 week or if they get worse, check with your doctor. ° °Dulcolax (bisacodyl) - Pick up over-the-counter and take as directed by the product packaging as needed to assist with the movement of your bowels.  Take with a full glass of water.  Use this product as needed if not relieved by Colace only.  ° °MiraLax (polyethylene glycol) - Pick up over-the-counter to have on hand.  MiraLax is a solution that will increase the amount of water in your bowels to assist with bowel movements.  Take as directed and can mix with a glass of water, juice, soda, coffee, or tea.  Take if you go more than two days without a movement. °Do not use MiraLax more than once per day. Call your doctor if you are still constipated or irregular after using this medication for 7 days in a row. ° °If you continue to have problems with postoperative constipation, please contact the office for further assistance and recommendations.  If you experience "the worst abdominal pain ever" or develop nausea or vomiting, please contact the office immediatly for further recommendations for treatment. ° °ITCHING ° If you experience itching with your medications, try taking only a single pain pill, or even half a pain pill at a time.  You can also use Benadryl over the counter for itching or also to help with sleep.  ° °TED HOSE STOCKINGS °Wear the elastic stockings on both legs for three weeks following surgery during the day but you may remove then at night for sleeping. ° °MEDICATIONS °See your medication summary on the “After Visit Summary” that the nursing staff will review with you prior to discharge.  You may have some home medications which will be placed on hold until you complete the course of blood thinner medication.  It is important for you to complete the blood thinner medication as prescribed by your surgeon.  Continue your approved medications as instructed at time of  discharge. ° °PRECAUTIONS °If you experience chest pain or shortness of breath - call 911 immediately for transfer to the hospital emergency department.  °If you develop a fever greater that 101 F, purulent drainage from wound, increased redness or drainage from wound, foul odor from the wound/dressing, or calf pain - CONTACT YOUR SURGEON.   °                                                °FOLLOW-UP APPOINTMENTS °Make sure you keep all of your appointments after your operation with your surgeon and caregivers. You should call the office at the above phone number and make an appointment for approximately two weeks after the date of your surgery or on the date instructed by your surgeon outlined in the "After Visit Summary". ° ° °RANGE OF MOTION AND STRENGTHENING EXERCISES  °Rehabilitation of the knee is important following a knee injury or   an operation. After just a few days of immobilization, the muscles of the thigh which control the knee become weakened and shrink (atrophy). Knee exercises are designed to build up the tone and strength of the thigh muscles and to improve knee motion. Often times heat used for twenty to thirty minutes before working out will loosen up your tissues and help with improving the range of motion but do not use heat for the first two weeks following surgery. These exercises can be done on a training (exercise) mat, on the floor, on a table or on a bed. Use what ever works the best and is most comfortable for you Knee exercises include:  °Leg Lifts - While your knee is still immobilized in a splint or cast, you can do straight leg raises. Lift the leg to 60 degrees, hold for 3 sec, and slowly lower the leg. Repeat 10-20 times 2-3 times daily. Perform this exercise against resistance later as your knee gets better.  °Quad and Hamstring Sets - Tighten up the muscle on the front of the thigh (Quad) and hold for 5-10 sec. Repeat this 10-20 times hourly. Hamstring sets are done by pushing the  foot backward against an object and holding for 5-10 sec. Repeat as with quad sets.  °· Leg Slides: Lying on your back, slowly slide your foot toward your buttocks, bending your knee up off the floor (only go as far as is comfortable). Then slowly slide your foot back down until your leg is flat on the floor again. °· Angel Wings: Lying on your back spread your legs to the side as far apart as you can without causing discomfort.  °A rehabilitation program following serious knee injuries can speed recovery and prevent re-injury in the future due to weakened muscles. Contact your doctor or a physical therapist for more information on knee rehabilitation.  ° °IF YOU ARE TRANSFERRED TO A SKILLED REHAB FACILITY °If the patient is transferred to a skilled rehab facility following release from the hospital, a list of the current medications will be sent to the facility for the patient to continue.  When discharged from the skilled rehab facility, please have the facility set up the patient's Home Health Physical Therapy prior to being released. Also, the skilled facility will be responsible for providing the patient with their medications at time of release from the facility to include their pain medication, the muscle relaxants, and their blood thinner medication. If the patient is still at the rehab facility at time of the two week follow up appointment, the skilled rehab facility will also need to assist the patient in arranging follow up appointment in our office and any transportation needs. ° °MAKE SURE YOU:  °Understand these instructions.  °Get help right away if you are not doing well or get worse.  ° ° °Pick up stool softner and laxative for home use following surgery while on pain medications. °Do not submerge incision under water. °Please use good hand washing techniques while changing dressing each day. °May shower starting three days after surgery. °Please use a clean towel to pat the incision dry following  showers. °Continue to use ice for pain and swelling after surgery. °Do not use any lotions or creams on the incision until instructed by your surgeon. ° °Take Xarelto for two and a half more weeks, then discontinue Xarelto. °Once the patient has completed the Xarelto, they may resume the 81 mg Aspirin. ° ° ° °Information on my medicine - XARELTO® (Rivaroxaban) ° °  This medication education was reviewed with me or my healthcare representative as part of my discharge preparation.  The pharmacist that spoke with me during my hospital stay was:  Hussein Askar, PharmD Candidate ° °Why was Xarelto® prescribed for you? °Xarelto® was prescribed for you to reduce the risk of blood clots forming after orthopedic surgery. The medical term for these abnormal blood clots is venous thromboembolism (VTE). ° °What do you need to know about xarelto® ? °Take your Xarelto® ONCE DAILY at the same time every day. °You may take it either with or without food. ° °If you have difficulty swallowing the tablet whole, you may crush it and mix in applesauce just prior to taking your dose. ° °Take Xarelto® exactly as prescribed by your doctor and DO NOT stop taking Xarelto® without talking to the doctor who prescribed the medication.  Stopping without other VTE prevention medication to take the place of Xarelto® may increase your risk of developing a clot. ° °After discharge, you should have regular check-up appointments with your healthcare provider that is prescribing your Xarelto®.   ° °What do you do if you miss a dose? °If you miss a dose, take it as soon as you remember on the same day then continue your regularly scheduled once daily regimen the next day. Do not take two doses of Xarelto® on the same day.  ° °Important Safety Information °A possible side effect of Xarelto® is bleeding. You should call your healthcare provider right away if you experience any of the following: °? Bleeding from an injury or your nose that does not  stop. °? Unusual colored urine (red or dark brown) or unusual colored stools (red or black). °? Unusual bruising for unknown reasons. °? A serious fall or if you hit your head (even if there is no bleeding). ° °Some medicines may interact with Xarelto® and might increase your risk of bleeding while on Xarelto®. To help avoid this, consult your healthcare provider or pharmacist prior to using any new prescription or non-prescription medications, including herbals, vitamins, non-steroidal anti-inflammatory drugs (NSAIDs) and supplements. ° °This website has more information on Xarelto®: www.xarelto.com. ° ° ° °

## 2016-11-03 LAB — BASIC METABOLIC PANEL
Anion gap: 6 (ref 5–15)
BUN: 12 mg/dL (ref 6–20)
CHLORIDE: 104 mmol/L (ref 101–111)
CO2: 28 mmol/L (ref 22–32)
Calcium: 8.7 mg/dL — ABNORMAL LOW (ref 8.9–10.3)
Creatinine, Ser: 0.54 mg/dL (ref 0.44–1.00)
GFR calc Af Amer: >60 mL/min (ref >60)
Glucose, Bld: 113 mg/dL — ABNORMAL HIGH (ref 65–99)
POTASSIUM: 3.5 mmol/L (ref 3.5–5.1)
SODIUM: 138 mmol/L (ref 135–145)

## 2016-11-03 LAB — CBC
HCT: 29.9 % — ABNORMAL LOW (ref 36.0–46.0)
HEMOGLOBIN: 9.9 g/dL — AB (ref 12.0–15.0)
MCH: 29 pg (ref 26.0–34.0)
MCHC: 33.1 g/dL (ref 30.0–36.0)
MCV: 87.7 fL (ref 78.0–100.0)
PLATELETS: 245 10*3/uL (ref 150–400)
RBC: 3.41 MIL/uL — AB (ref 3.87–5.11)
RDW: 13.4 % (ref 11.5–15.5)
WBC: 15.3 10*3/uL — ABNORMAL HIGH (ref 4.0–10.5)

## 2016-11-03 NOTE — Progress Notes (Signed)
   Subjective: 2 Days Post-Op Procedure(s) (LRB): LEFT TOTAL KNEE ARTHROPLASTY (Left) Patient reports pain as mild.   Patient seen in rounds by Dr. Lequita HaltAluisio. Patient is well, but has had some minor complaints of pain in the knee, requiring pain medications Patient is ready to go home  Objective: Vital signs in last 24 hours: Temp:  [97.8 F (36.6 C)-100.2 F (37.9 C)] 100.2 F (37.9 C) (01/17 0530) Pulse Rate:  [62-97] 97 (01/17 0530) Resp:  [15-18] 18 (01/17 0530) BP: (73-125)/(40-78) 114/78 (01/17 0530) SpO2:  [95 %-100 %] 99 % (01/17 0530)  Intake/Output from previous day:  Intake/Output Summary (Last 24 hours) at 11/03/16 0732 Last data filed at 11/02/16 2200  Gross per 24 hour  Intake              460 ml  Output             2250 ml  Net            -1790 ml    Intake/Output this shift: No intake/output data recorded.  Labs:  Recent Labs  11/02/16 0428 11/03/16 0422  HGB 10.5* 9.9*    Recent Labs  11/02/16 0428 11/03/16 0422  WBC 10.7* 15.3*  RBC 3.61* 3.41*  HCT 31.8* 29.9*  PLT 257 245    Recent Labs  11/02/16 0428 11/03/16 0422  NA 134* 138  K 3.9 3.5  CL 102 104  CO2 26 28  BUN 12 12  CREATININE 0.64 0.54  GLUCOSE 127* 113*  CALCIUM 8.5* 8.7*   No results for input(s): LABPT, INR in the last 72 hours.  EXAM: General - Patient is Alert, Appropriate and Oriented Extremity - Neurovascular intact Sensation intact distally Intact pulses distally Dorsiflexion/Plantar flexion intact Incision - clean, dry, no drainage Motor Function - intact, moving foot and toes well on exam.   Assessment/Plan: 2 Days Post-Op Procedure(s) (LRB): LEFT TOTAL KNEE ARTHROPLASTY (Left) Procedure(s) (LRB): LEFT TOTAL KNEE ARTHROPLASTY (Left) Past Medical History:  Diagnosis Date  . Allergic rhinitis   . Anxiety   . DJD (degenerative joint disease)    Sees Dr. Dierdre ForthBeekman  . Headache(784.0)   . Hypertension   . Mild anemia    iron def, reports on iron her  whole life  . Obesity   . URI (upper respiratory infection)   . UTI (lower urinary tract infection)    Principal Problem:   OA (osteoarthritis) of knee  Estimated body mass index is 30.9 kg/m as calculated from the following:   Height as of this encounter: 5\' 4"  (1.626 m).   Weight as of this encounter: 81.6 kg (180 lb). Up with therapy Discharge home  Diet - Cardiac diet Follow up - in 2 weeks Activity - WBAT Disposition - Home Condition Upon Discharge - Good D/C Meds - See DC Summary DVT Prophylaxis - Xarelto  Avel Peacerew Curley Hogen, PA-C Orthopaedic Surgery 11/03/2016, 7:32 AM

## 2016-11-03 NOTE — Progress Notes (Signed)
Physical Therapy Treatment Patient Details Name: Paula Kelley MRN: [id number] DOB: 1951/02/21 Today's Date: 11/03/2016    History of Present Illness 66 yo female s/p L TKA 11/01/16. Hx of chronic R knee pain-will need surgery per pt.     PT Comments    Pt reports increased pain last night and this am. She was able to participate fairly well with therapy this morning. Will plan to have a 2nd session. Pt stated she is unsure if she is discharging today.   Follow Up Recommendations  Home health PT;Supervision/Assistance - 24 hour     Equipment Recommendations  None recommended by PT    Recommendations for Other Services       Precautions / Restrictions Precautions Precautions: Fall Required Braces or Orthoses: Knee Immobilizer - Left Knee Immobilizer - Left: Discontinue once straight leg raise with < 10 degree lag Restrictions Weight Bearing Restrictions: No LLE Weight Bearing: Weight bearing as tolerated    Mobility  Bed Mobility Overal bed mobility: Needs Assistance Bed Mobility: Supine to Sit     Supine to sit: Min assist     General bed mobility comments: assist for L LE.  Transfers Overall transfer level: Needs assistance Equipment used: Rolling walker (2 wheeled) Transfers: Sit to/from Stand Sit to Stand: Min assist         General transfer comment: VCs safety, technique, hand/LE placement. Assist to rise, stabilize, control descent.   Ambulation/Gait Ambulation/Gait assistance: Min guard Ambulation Distance (Feet): 50 Feet Assistive device: Rolling walker (2 wheeled) Gait Pattern/deviations: Step-to pattern;Antalgic     General Gait Details: VCs safety, sequence, distance from walker. slow gait speed.    Stairs            Wheelchair Mobility    Modified Rankin (Stroke Patients Only)       Balance                                    Cognition Arousal/Alertness: Awake/alert Behavior During Therapy: WFL for tasks  assessed/performed Overall Cognitive Status: Within Functional Limits for tasks assessed                      Exercises Total Joint Exercises Ankle Circles/Pumps: AROM;Both;Supine;15 reps Quad Sets: AROM;Both;10 reps;Supine Heel Slides: AAROM;Left;10 reps;Supine Hip ABduction/ADduction: AAROM;Left;10 reps;Supine Straight Leg Raises: AAROM;Left;10 reps;Supine Goniometric ROM: ~10-60 degrees (limited by pain)    General Comments        Pertinent Vitals/Pain Pain Assessment: 0-10 Pain Score: 7  Pain Location: L knee Pain Descriptors / Indicators: Aching Pain Intervention(s): Monitored during session;Repositioned;Ice applied;RN gave pain meds during session    Home Living                      Prior Function            PT Goals (current goals can now be found in the care plan section) Progress towards PT goals: Progressing toward goals    Frequency    7X/week      PT Plan Current plan remains appropriate    Co-evaluation             End of Session Equipment Utilized During Treatment: Gait belt;Left knee immobilizer Activity Tolerance: Patient tolerated treatment well Patient left: in chair;with call bell/phone within reach;with family/visitor present     Time: [id number] PT Time Calculation (min) (ACUTE ONLY): 35 min  Charges:  $  Gait Training: 8-22 mins $Therapeutic Exercise: 8-22 mins                    G Codes:      Weston Anna, MPT Pager: [phone]

## 2016-11-03 NOTE — Progress Notes (Signed)
Physical Therapy Treatment Patient Details Name: Paula Kelley MRN: [name] DOB: 11/28/1950 Today's Date: 11/03/2016    History of Present Illness 66 yo female s/p L TKA 11/01/16. Hx of chronic R knee pain-will need surgery per pt.     PT Comments    Progressing with mobility. Moderate pain with activity but pt was able to participate. Practiced 1 step. Pt not discharging home today. Will continue to follow.   Follow Up Recommendations  Home health PT;Supervision/Assistance - 24 hour     Equipment Recommendations  None recommended by PT    Recommendations for Other Services       Precautions / Restrictions Precautions Precautions: Fall Required Braces or Orthoses: Knee Immobilizer - Left Knee Immobilizer - Left: Discontinue once straight leg raise with < 10 degree lag Restrictions Weight Bearing Restrictions: No LLE Weight Bearing: Weight bearing as tolerated    Mobility  Bed Mobility   Bed Mobility: Sit to Supine       Sit to supine: Min assist   General bed mobility comments: assist for L LE.  Transfers Overall transfer level: Needs assistance Equipment used: Rolling walker (2 wheeled) Transfers: Sit to/from Stand Sit to Stand: Min assist         General transfer comment: VCs safety, technique, hand/LE placement. Assist to rise, stabilize, control descent.   Ambulation/Gait Ambulation/Gait assistance: Min guard Ambulation Distance (Feet): 40 Feet Assistive device: Rolling walker (2 wheeled) Gait Pattern/deviations: Step-to pattern;Antalgic     General Gait Details: VCs safety, sequence, distance from walker. slow gait speed.    Stairs Stairs: Yes   Stair Management: Step to pattern;Backwards;With walker Number of Stairs: 1 General stair comments: Assist to stabilize pt and maneuver with walker. VCs safety, technique, sequence.   Wheelchair Mobility    Modified Rankin (Stroke Patients Only)       Balance                                     Cognition Arousal/Alertness: Awake/alert Behavior During Therapy: WFL for tasks assessed/performed Overall Cognitive Status: Within Functional Limits for tasks assessed                      Exercises      General Comments        Pertinent Vitals/Pain Pain Assessment: 0-10 Pain Score: 7  Pain Location: L knee Pain Descriptors / Indicators: Sore;Tender;Aching Pain Intervention(s): Monitored during session;Repositioned;Ice applied    Home Living                      Prior Function            PT Goals (current goals can now be found in the care plan section) Progress towards PT goals: Progressing toward goals    Frequency    7X/week      PT Plan Current plan remains appropriate    Co-evaluation             End of Session Equipment Utilized During Treatment: Gait belt;Left knee immobilizer Activity Tolerance: Patient tolerated treatment well Patient left: in bed;with call bell/phone within reach;with family/visitor present     Time: [id number] PT Time Calculation (min) (ACUTE ONLY): 25 min  Charges:  $Gait Training: 8-22 mins                    G Codes:  Weston Anna, MPT Pager: [phone]

## 2016-11-04 LAB — CBC
HEMATOCRIT: 29.3 % — AB (ref 36.0–46.0)
HEMOGLOBIN: 9.7 g/dL — AB (ref 12.0–15.0)
MCH: 29.2 pg (ref 26.0–34.0)
MCHC: 33.1 g/dL (ref 30.0–36.0)
MCV: 88.3 fL (ref 78.0–100.0)
Platelets: 218 10*3/uL (ref 150–400)
RBC: 3.32 MIL/uL — ABNORMAL LOW (ref 3.87–5.11)
RDW: 13.5 % (ref 11.5–15.5)
WBC: 12.2 10*3/uL — AB (ref 4.0–10.5)

## 2016-11-04 NOTE — Progress Notes (Signed)
Pt to d/c home with [facility]. The 3-1 was delivered to room prior to d/c. Prescriptions given to patient's husband, per her request. AVS reviewed and "My Chart" discussed with pt. Pt capable of verbalizing medications, dressing changes, signs and symptoms of infection, and follow-up appointments. Remains hemodynamically stable. No signs and symptoms of distress. Educated pt to return to ER in the case of SOB, dizziness, or chest pain.

## 2016-11-04 NOTE — Progress Notes (Signed)
   Subjective: 3 Days Post-Op Procedure(s) (LRB): LEFT TOTAL KNEE ARTHROPLASTY (Left) Patient reports pain as mild.  Feels much better today Plan is to go Home after hospital stay.  Objective: Vital signs in last 24 hours: Temp:  [99.1 F (37.3 C)-99.2 F (37.3 C)] 99.2 F (37.3 C) (01/18 0554) Pulse Rate:  [83-96] 96 (01/18 0554) Resp:  [16-18] 16 (01/18 0554) BP: (136-144)/(73-77) 136/77 (01/18 0554) SpO2:  [95 %-100 %] 95 % (01/18 0554)  Intake/Output from previous day:  Intake/Output Summary (Last 24 hours) at 11/04/16 0740 Last data filed at 11/04/16 0000  Gross per 24 hour  Intake              480 ml  Output                0 ml  Net              480 ml    Intake/Output this shift: No intake/output data recorded.  Labs:  Recent Labs  11/02/16 0428 11/03/16 0422 11/04/16 0421  HGB 10.5* 9.9* 9.7*    Recent Labs  11/03/16 0422 11/04/16 0421  WBC 15.3* 12.2*  RBC 3.41* 3.32*  HCT 29.9* 29.3*  PLT 245 218    Recent Labs  11/02/16 0428 11/03/16 0422  NA 134* 138  K 3.9 3.5  CL 102 104  CO2 26 28  BUN 12 12  CREATININE 0.64 0.54  GLUCOSE 127* 113*  CALCIUM 8.5* 8.7*   No results for input(s): LABPT, INR in the last 72 hours.  EXAM General - Patient is Alert, Appropriate and Oriented Extremity - Neurologically intact Neurovascular intact No cellulitis present Compartment soft Dressing/Incision - clean, dry, no drainage Motor Function - intact, moving foot and toes well on exam.   Past Medical History:  Diagnosis Date  . Allergic rhinitis   . Anxiety   . DJD (degenerative joint disease)    Sees Dr. Dierdre ForthBeekman  . Headache(784.0)   . Hypertension   . Mild anemia    iron def, reports on iron her whole life  . Obesity   . URI (upper respiratory infection)   . UTI (lower urinary tract infection)     Assessment/Plan: 3 Days Post-Op Procedure(s) (LRB): LEFT TOTAL KNEE ARTHROPLASTY (Left) Principal Problem:   OA (osteoarthritis) of  knee   Discharge home with home health  DVT Prophylaxis - Xarelto Weight-Bearing as tolerated to left leg  Margene Cherian V 11/04/2016, 7:40 AM

## 2016-11-04 NOTE — Progress Notes (Signed)
Physical Therapy Treatment Patient Details Name: Paula Kelley MRN: [id number] DOB: 06-02-1951 Today's Date: 11/04/2016    History of Present Illness 66 yo female s/p L TKA 11/01/16. Hx of chronic R knee pain-will need surgery per pt.     PT Comments    POD # 3 am session Assisted OOB to amb a greater distance in hallway then returned to bed to perform all supine TKR TE's following HEP handout.  Instructed on proper tech, freq as well as use of ICE.   Follow Up Recommendations  Home health PT;Supervision/Assistance - 24 hour     Equipment Recommendations  None recommended by PT    Recommendations for Other Services       Precautions / Restrictions Precautions Precautions: Fall Precaution Comments: pt able to perform SLR Restrictions Weight Bearing Restrictions: No LLE Weight Bearing: Weight bearing as tolerated    Mobility  Bed Mobility Overal bed mobility: Needs Assistance Bed Mobility: Supine to Sit;Sit to Supine     Supine to sit: Min assist Sit to supine: Min assist   General bed mobility comments: assist for L LE.  Transfers Overall transfer level: Needs assistance Equipment used: Rolling walker (2 wheeled) Transfers: Sit to/from Stand Sit to Stand: Min guard;Min assist Stand pivot transfers: Min guard;Min assist       General transfer comment: VCs safety, technique, hand/LE placement. Assist to rise, stabilize, control descent.   Ambulation/Gait Ambulation/Gait assistance: Min guard Ambulation Distance (Feet): 48 Feet Assistive device: Rolling walker (2 wheeled) Gait Pattern/deviations: Step-to pattern;Antalgic     General Gait Details: VCs safety, sequence, distance from walker. slow gait speed.    Stairs            Wheelchair Mobility    Modified Rankin (Stroke Patients Only)       Balance                                    Cognition Arousal/Alertness: Awake/alert Behavior During Therapy: WFL for tasks  assessed/performed Overall Cognitive Status: Within Functional Limits for tasks assessed                      Exercises   Total Knee Replacement TE's 10 reps B LE ankle pumps 10 reps towel squeezes 10 reps knee presses 10 reps heel slides  10 reps SAQ's 10 reps SLR's 10 reps ABD Followed by ICE     General Comments        Pertinent Vitals/Pain Pain Assessment: 0-10 Pain Score: 4  Pain Location: L knee Pain Descriptors / Indicators: Sore;Tender;Aching Pain Intervention(s): Monitored during session;Repositioned;Ice applied    Home Living                      Prior Function            PT Goals (current goals can now be found in the care plan section) Progress towards PT goals: Progressing toward goals    Frequency    7X/week      PT Plan Current plan remains appropriate    Co-evaluation             End of Session Equipment Utilized During Treatment: Gait belt Activity Tolerance: Patient tolerated treatment well Patient left: in bed;with bed alarm set;with family/visitor present;with call bell/phone within reach     Time: [id number] PT Time Calculation (min) (ACUTE ONLY): 41 min  Charges:  $  Gait Training: 8-22 mins $Therapeutic Exercise: 8-22 mins $Therapeutic Activity: 8-22 mins                    G Codes:      Rica Koyanagi  PTA [facility]  Acute  Rehab Pager      [phone]

## 2016-11-22 ENCOUNTER — Ambulatory Visit: Payer: BC Managed Care – PPO | Attending: Orthopedic Surgery | Admitting: Physical Therapy

## 2016-11-22 ENCOUNTER — Encounter: Payer: Self-pay | Admitting: Physical Therapy

## 2016-11-22 DIAGNOSIS — M6281 Muscle weakness (generalized): Secondary | ICD-10-CM | POA: Diagnosis present

## 2016-11-22 DIAGNOSIS — M25662 Stiffness of left knee, not elsewhere classified: Secondary | ICD-10-CM | POA: Insufficient documentation

## 2016-11-22 NOTE — Therapy (Signed)
[facility] [address] [place], [place], [address] Phone: [phone]   Fax:  [phone]  Physical Therapy Evaluation  Patient Details  Name: Paula Kelley MRN: [id number] Date of Birth: 07-18-1951 Referring Provider: Trudee GripAlusio, Frank  Encounter Date: 11/22/2016      PT End of Session - 11/22/16 1431    Visit Number 1   Number of Visits 10   Date for PT Re-Evaluation 01/17/17   Authorization Type g codes at 10th visit   PT Start Time 1220   PT Stop Time 1313   PT Time Calculation (min) 53 min   Activity Tolerance Patient tolerated treatment well   Behavior During Therapy [facility] for tasks assessed/performed      Past Medical History:  Diagnosis Date  . Allergic rhinitis   . Anxiety   . DJD (degenerative joint disease)    Sees Dr. Dierdre ForthBeekman  . Headache(784.0)   . Hypertension   . Mild anemia    iron def, reports on iron her whole life  . Obesity   . URI (upper respiratory infection)   . UTI (lower urinary tract infection)     Past Surgical History:  Procedure Laterality Date  . COLONOSCOPY    . svd     2 live births  . TOTAL KNEE ARTHROPLASTY Left 11/01/2016   Procedure: LEFT TOTAL KNEE ARTHROPLASTY;  Surgeon: Ollen GrossFrank Aluisio, MD;  Location: [facility];  Service: Orthopedics;  Laterality: Left;  Adductor Block    There were no vitals filed for this visit.       Subjective Assessment - 11/22/16 1229    Subjective Pt denies pain currently.  Pt is icing.  States her Rt knee hurts more now and she will have to get that one replaced too eventually   Patient Stated Goals wants to get back to walking, but mainly do regular activities until gets the other knee done   Currently in Pain? Yes   Pain Score 4    Pain Location Knee   Pain Orientation Left   Pain Descriptors / Indicators Aching   Pain Type Surgical pain   Pain Onset More than a month ago   Pain Frequency Intermittent   Aggravating Factors  not sure  because it is getting better   Pain Relieving Factors tylenol, ice, weaning off of pain medicine   Effect of Pain on Daily Activities now can sleep, has trouble getting clothes, haven't been doing a lot of cooking/cleaning            [facility] Assessment - 11/22/16 0001      Assessment   Medical Diagnosis S/P Lt knee replacement   Referring Provider Trudee GripAlusio, Frank   Onset Date/Surgical Date 11/01/16   Next MD Visit 12/09/16   Prior Therapy no     Precautions   Precautions None     Restrictions   Weight Bearing Restrictions No     Balance Screen   Has the patient fallen in the past 6 months No     Home Environment   Living Environment Private residence   Living Arrangements Spouse/significant other     Prior Function   Level of Independence Independent   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   Vocation Requirements computer a lot, sometimes up and down   Leisure walking 2-4 miles     Cognition   Overall Cognitive Status Within Functional Limits for tasks assessed     Observation/Other Assessments   Observations  ambulates with Lt hip hike and decreased Lt knee flexion   Skin Integrity intact, incision scabbed and healing well   Focus on Therapeutic Outcomes (FOTO)  66% limitation  goal 43%     Circumferential Edema   Circumferential - Right --  39cm joint line   Circumferential - Left  --  41cm joint line; 38 tibial tuberosity     Posture/Postural Control   Posture/Postural Control Postural limitations   Postural Limitations Flexed trunk     ROM / Strength   AROM / PROM / Strength AROM;Strength     AROM   Overall AROM Comments --   Left Knee Extension -22   Left Knee Flexion 100     Strength   Overall Strength Comments Rt abduction, flexion, Lt internal rotation, knee flexion   Right Hip Flexion 4-/5   Right Hip Extension 4-/5   Right Hip External Rotation  4-/5   Right Hip Internal Rotation 3+/5   Right Hip ABduction 4-/5   Right Hip  ADduction 4/5   Left Hip Flexion 3+/5   Left Hip Extension 4-/5   Left Hip External Rotation 4-/5   Left Hip Internal Rotation 4/5   Left Hip ABduction 3/5   Left Hip ADduction 4/5   Right Knee Flexion 4+/5   Right Knee Extension 4+/5   Left Knee Flexion 4-/5   Left Knee Extension 4-/5     Ambulation/Gait   Gait Pattern Decreased stance time - left;Decreased weight shift to left;Left circumduction;Decreased hip/knee flexion - left;Decreased step length - right     Standardized Balance Assessment   Standardized Balance Assessment Five Times Sit to Stand   Five times sit to stand comments  17sec with UE; 20 sec no UE     Timed Up and Go Test   Normal TUG (seconds) 11                           PT Education - 11/22/16 1430    Education provided Yes   Education Details hamstring stretch and increase heel strike and knee flexion during gait   Person(s) Educated Patient   Methods Explanation;Demonstration;Verbal cues;Handout   Comprehension Verbalized understanding;Returned demonstration          PT Short Term Goals - 11/22/16 1444      PT SHORT TERM GOAL #1   Title independent with inital HEP   Time 4   Period Weeks   Status New     PT SHORT TERM GOAL #2   Title demonstrates increased knee flexion without Lt hip hike during gait   Time 4   Period Weeks   Status New     PT SHORT TERM GOAL #3   Title demonstrates MMT of at least 4/5 Lt hip abduction, extension, and Lt knee flexion and extension.   Time 4   Period Weeks   Status New     PT SHORT TERM GOAL #4   Title reports 25% reduced pain during functional activities.   Time 4   Period Weeks   Status New     PT SHORT TERM GOAL #5   Title improved knee extension to -12 degreee, and flexion to 110 degrees for improved gait   Time 4   Period Weeks   Status New           PT Long Term Goals - 11/22/16 1447      PT LONG TERM GOAL #1   Title  independent with advanced HEP   Time 8    Period Weeks   Status New     PT LONG TERM GOAL #2   Title FOTO < or = to 43% limitation   Time 8   Period Weeks   Status New     PT LONG TERM GOAL #3   Title improved knee extension to 0 degreee, and flexion to 120 degrees for improved gait   Time 8   Period Weeks   Status New     PT LONG TERM GOAL #4   Title improved 5 x sit to stand to < or = to 12 seconds to demonstrate improved strength and reduced risk of falls   Time 8   Period Weeks   Status New     PT LONG TERM GOAL #5   Title able to get dressed without assistance due to improved ROM and strengthe   Time 8   Period Weeks   Status New               Plan - 12/21/2016 1435    Clinical Impression Statement Pt presents to clinic for low complexity eval due to only one element needing to be addressed.  Pt demonstrates weakness from 3+ to 4+/5 in bilateral LE.  She has gait impairments as described above as well as limitations in ROM with knee extension at -22 degrees and knee flexion of 100 degrees on Lt LE.  Pt has swelling of Lt knee.  At this time patient is limited in normal functional activities and needs more help with things like getting dressed, household chores, and cooking.  Patient experiences pain in Lt knee up to 4/10 when moving the wrong way.  She demonstrates some instability with 5x sit to stand at 17 sec with use of UE which isabove norm for her age.  Pt will benefit from skilled PT to reduce risk of falls and address impairments so she can return to functional activities and be able to have surgery to repair other knee.   Rehab Potential Excellent   Clinical Impairments Affecting Rehab Potential Rt knee pain and OA, s/p Lt total knee replacement   PT Frequency 3x / week  reducing to 2x/ week at 4 weeks   PT Duration 8 weeks   PT Treatment/Interventions ADLs/Self Care Home Management;Biofeedback;Cryotherapy;Electrical Stimulation;Moist Heat;Gait training;Stair training;Functional mobility  training;Therapeutic activities;Therapeutic exercise;Balance training;Neuromuscular re-education;Patient/family education;Manual techniques;Vasopneumatic Device;Passive range of motion   PT Next Visit Plan ROM knee flexion/extension, review correct gait techniques, LE strengthening   Recommended Other Services none   Consulted and Agree with Plan of Care Patient      Patient will benefit from skilled therapeutic intervention in order to improve the following deficits and impairments:  Abnormal gait, Decreased activity tolerance, Difficulty walking, Decreased strength, Pain  Visit Diagnosis: Stiffness of left knee, not elsewhere classified  Muscle weakness (generalized)      G-Codes - 12-21-2016 1434    Functional Assessment Tool Used FOTO, 5 x sit to stand, TUG, clinical reasoning   Functional Limitation Mobility: Walking and moving around   Mobility: Walking and Moving Around Current Status [phone]) At least 60 percent but less than 80 percent impaired, limited or restricted   Mobility: Walking and Moving Around Goal Status [phone]) At least 40 percent but less than 60 percent impaired, limited or restricted       Problem List Patient Active Problem List   Diagnosis Date Noted  . Anemia 11/01/2008  . Allergic rhinitis  11/01/2008  . Essential hypertension 09/03/2008  . OA (osteoarthritis) of knee 09/03/2008    Vincente Poli, PT 11/22/2016, 2:52 PM  [facility] [address], [address] [place], [place], [address] Phone: [phone]   Fax:  [phone]  Name: Paula Kelley MRN: [id number] Date of Birth: [birth year]

## 2016-11-22 NOTE — Patient Instructions (Signed)
Chair Sitting    Sit at edge of seat, spine straight, one leg extended. Put a hand on each thigh and bend forward from the hip, keeping spine straight. Allow hand on extended leg to reach toward toes. Support upper body with other arm. Hold _30__ seconds. Repeat __3_ times per session. Do __1_ sessions per day.  Copyright  VHI. All rights reserved.   

## 2016-11-24 ENCOUNTER — Encounter: Payer: Self-pay | Admitting: Physical Therapy

## 2016-11-24 ENCOUNTER — Ambulatory Visit: Payer: BC Managed Care – PPO | Admitting: Physical Therapy

## 2016-11-24 DIAGNOSIS — M6281 Muscle weakness (generalized): Secondary | ICD-10-CM

## 2016-11-24 DIAGNOSIS — M25662 Stiffness of left knee, not elsewhere classified: Secondary | ICD-10-CM | POA: Diagnosis not present

## 2016-11-24 NOTE — Therapy (Signed)
[facility] [address], [address] [place], [place], [address] Phone: [phone]   Fax:  [phone]  Physical Therapy Treatment  Patient Details  Name: Paula LoOlga L Kelley MRN: [id number] Date of Birth: [birth year] Referring Provider: Trudee GripAlusio, Frank  Encounter Date: 11/24/2016      PT End of Session - 11/24/16 1234    Visit Number 2   Number of Visits 10   Date for PT Re-Evaluation 01/17/17   Authorization Type g codes at 10th visit   PT Start Time 1229   PT Stop Time 1324   PT Time Calculation (min) 55 min   Equipment Utilized During Treatment Gait belt   Activity Tolerance Patient tolerated treatment well   Behavior During Therapy [place] for tasks assessed/performed      Past Medical History:  Diagnosis Date  . Allergic rhinitis   . Anxiety   . DJD (degenerative joint disease)    Sees Dr. Dierdre ForthBeekman  . Headache(784.0)   . Hypertension   . Mild anemia    iron def, reports on iron her whole life  . Obesity   . URI (upper respiratory infection)   . UTI (lower urinary tract infection)     Past Surgical History:  Procedure Laterality Date  . COLONOSCOPY    . svd     2 live births  . TOTAL KNEE ARTHROPLASTY Left 11/01/2016   Procedure: LEFT TOTAL KNEE ARTHROPLASTY;  Surgeon: Ollen GrossFrank Aluisio, MD;  Location: [facility];  Service: Orthopedics;  Laterality: Left;  Adductor Block    There were no vitals filed for this visit.      Subjective Assessment - 11/24/16 1231    Subjective Pt reports knee feeling stiff and sore.    Patient Stated Goals wants to get back to walking, but mainly do regular activities until gets the other knee done   Currently in Pain? Yes   Pain Score 3    Pain Location Knee   Pain Orientation Left   Pain Descriptors / Indicators Aching   Pain Type Surgical pain   Pain Onset More than a month ago   Pain Frequency Intermittent                         [facility] Adult PT Treatment/Exercise -  11/24/16 0001      Exercises   Exercises Knee/Hip     Knee/Hip Exercises: Aerobic   Nustep L1 x 10  Therapist present to discuss treatment     Knee/Hip Exercises: Supine   Quad Sets Strengthening;Left;10 reps   Heel Slides Strengthening;Left;10 reps   Other Supine Knee/Hip Exercises Ball squeeze  x10     Modalities   Modalities --     Cryotherapy   Number Minutes Cryotherapy --   Cryotherapy Location --     Vasopneumatic   Number Minutes Vasopneumatic  15 minutes   Vasopnuematic Location  Knee   Vasopneumatic Pressure Medium   Vasopneumatic Temperature  3 snow flakes     Manual Therapy   Manual Therapy Soft tissue mobilization;Passive ROM;Edema management   Soft tissue mobilization To Lt knee and surrounding tendons  scar mobilization   Passive ROM Knee ROM                  PT Short Term Goals - 11/24/16 1235      PT SHORT TERM GOAL #1   Title independent with inital HEP   Time 4   Period Weeks  Status On-going     PT SHORT TERM GOAL #2   Title demonstrates increased knee flexion without Lt hip hike during gait   Time 4   Period Weeks   Status On-going     PT SHORT TERM GOAL #3   Title demonstrates MMT of at least 4/5 Lt hip abduction, extension, and Lt knee flexion and extension.   Time 4   Period Weeks   Status On-going     PT SHORT TERM GOAL #4   Title reports 25% reduced pain during functional activities.   Time 4   Period Weeks   Status On-going     PT SHORT TERM GOAL #5   Title improved knee extension to -12 degreee, and flexion to 110 degrees for improved gait   Time 4   Period Weeks   Status On-going           PT Long Term Goals - 11/24/16 1235      PT LONG TERM GOAL #1   Title independent with advanced HEP   Time 8   Period Weeks   Status On-going     PT LONG TERM GOAL #2   Title FOTO < or = to 43% limitation   Time 8   Period Weeks   Status On-going     PT LONG TERM GOAL #3   Title improved knee extension to  0 degreee, and flexion to 120 degrees for improved gait   Time 8   Period Weeks   Status On-going     PT LONG TERM GOAL #4   Title improved 5 x sit to stand to < or = to 12 seconds to demonstrate improved strength and reduced risk of falls   Time 8   Period Weeks   Status On-going     PT LONG TERM GOAL #5   Title able to get dressed without assistance due to improved ROM and strengthe   Time 8   Period Weeks   Status On-going               Plan - 11/24/16 1314    Clinical Impression Statement Pt presents s/p Lt tota knee replacement using straight cane for ambulation with antalgic gait due to pain in both Lt and Rt knee. Pt able to initiate good quad contraction in lt knee and did well with all exercises. Pt reports tightness and some numbness in Lt knee. Pt instructed in scar massage and using lotion around incision area. Pt tolerate all treatment well and will continue to benefit from silled thearpy for LE strength and ROM.    Rehab Potential Excellent   Clinical Impairments Affecting Rehab Potential Rt knee pain and OA, s/p Lt total knee replacement   PT Frequency 3x / week   PT Duration 8 weeks   PT Treatment/Interventions ADLs/Self Care Home Management;Biofeedback;Cryotherapy;Electrical Stimulation;Moist Heat;Gait training;Stair training;Functional mobility training;Therapeutic activities;Therapeutic exercise;Balance training;Neuromuscular re-education;Patient/family education;Manual techniques;Vasopneumatic Device;Passive range of motion   PT Next Visit Plan ROM knee flexion/extension, review correct gait techniques, LE strengthening   Consulted and Agree with Plan of Care Patient      Patient will benefit from skilled therapeutic intervention in order to improve the following deficits and impairments:  Abnormal gait, Decreased activity tolerance, Difficulty walking, Decreased strength, Pain  Visit Diagnosis: Stiffness of left knee, not elsewhere classified  Muscle  weakness (generalized)     Problem List Patient Active Problem List   Diagnosis Date Noted  . Anemia 11/01/2008  . Allergic rhinitis  11/01/2008  . Essential hypertension 09/03/2008  . OA (osteoarthritis) of knee 09/03/2008    Dessa Phi PTA 11/24/2016, 1:55 PM  [facility] [address] [place], [place], [address] Phone: [phone]   Fax:  [phone]  Name: Keneisha Heckart Sabet MRN: [id number] Date of Birth: October 19, 1950

## 2016-11-24 NOTE — Patient Instructions (Signed)
Quad Set    Slowly tighten muscles on thigh of LEFT leg while counting out loud to _10___.  Repeat __10__ times. Do __1__ sessions per day.  http://gt2.exer.us/362   Copyright  VHI. All rights reserved.   ANKLE: Pumps    Point toes down, then up. _20__ reps per set, _2__ sets per day, _3__ days per week   Copyright  VHI. All rights reserved.  Dessa PhiKatherine Matthews, PTA 11/24/16 12:45 PM  [facility] [address], [address] [place], [place] [address] Phone # [phone] Fax [phone]

## 2016-11-26 ENCOUNTER — Encounter: Payer: Self-pay | Admitting: Physical Therapy

## 2016-11-26 ENCOUNTER — Ambulatory Visit: Payer: BC Managed Care – PPO | Admitting: Physical Therapy

## 2016-11-26 DIAGNOSIS — M6281 Muscle weakness (generalized): Secondary | ICD-10-CM

## 2016-11-26 DIAGNOSIS — M25662 Stiffness of left knee, not elsewhere classified: Secondary | ICD-10-CM | POA: Diagnosis not present

## 2016-11-26 NOTE — Therapy (Signed)
[facility] [address], [address] [place], [place], [address] Phone: [phone]   Fax:  [phone]  Physical Therapy Treatment  Patient Details  Name: Paula Kelley MRN: [id number] Date of Birth: 12-Oct-1951 Referring Provider: Trudee GripAlusio, Frank  Encounter Date: 11/26/2016      PT End of Session - 11/26/16 1012    Visit Number 3   Number of Visits 10   Date for PT Re-Evaluation 01/17/17   Authorization Type g codes at 10th visit   PT Start Time 1009   PT Stop Time 1052   PT Time Calculation (min) 43 min   Activity Tolerance Patient tolerated treatment well   Behavior During Therapy [facility] for tasks assessed/performed      Past Medical History:  Diagnosis Date  . Allergic rhinitis   . Anxiety   . DJD (degenerative joint disease)    Sees Dr. Dierdre ForthBeekman  . Headache(784.0)   . Hypertension   . Mild anemia    iron def, reports on iron her whole life  . Obesity   . URI (upper respiratory infection)   . UTI (lower urinary tract infection)     Past Surgical History:  Procedure Laterality Date  . COLONOSCOPY    . svd     2 live births  . TOTAL KNEE ARTHROPLASTY Left 11/01/2016   Procedure: LEFT TOTAL KNEE ARTHROPLASTY;  Surgeon: Ollen GrossFrank Aluisio, MD;  Location: [facility];  Service: Orthopedics;  Laterality: Left;  Adductor Block    There were no vitals filed for this visit.      Subjective Assessment - 11/26/16 1009    Subjective Pt reports some stiffness first thing in the morning   Patient Stated Goals wants to get back to walking, but mainly do regular activities until gets the other knee done                         [facility]/Exercise - 11/26/16 0001      Knee/Hip Exercises: Stretches   Passive Hamstring Stretch Left;1 rep;10 seconds     Knee/Hip Exercises: Aerobic   Stationary Bike L0 x7 minutes  Able to get full revolutions with some hip hiking     Knee/Hip Exercises: Seated   Long Arc Quad Strengthening;Both;2 sets;10 reps     Knee/Hip Exercises: Supine   Short Arc Quad Sets Strengthening;Both;2 sets;10 reps   Heel Slides Strengthening;Left;10 reps   Other Supine Knee/Hip Exercises Ball squeeze  x10     Vasopneumatic   Number Minutes Vasopneumatic  10 minutes   Vasopnuematic Location  Knee   Vasopneumatic Pressure Medium   Vasopneumatic Temperature  3 snow flakes     Manual Therapy   Manual Therapy Soft tissue mobilization;Passive ROM;Edema management   Soft tissue mobilization To Lt knee and surrounding tendons  scar mobilization   Passive ROM Knee ROM                  PT Short Term Goals - 11/24/16 1235      PT SHORT TERM GOAL #1   Title independent with inital HEP   Time 4   Period Weeks   Status On-going     PT SHORT TERM GOAL #2   Title demonstrates increased knee flexion without Lt hip hike during gait   Time 4   Period Weeks   Status On-going     PT SHORT TERM GOAL #3   Title demonstrates MMT of at  least 4/5 Lt hip abduction, extension, and Lt knee flexion and extension.   Time 4   Period Weeks   Status On-going     PT SHORT TERM GOAL #4   Title reports 25% reduced pain during functional activities.   Time 4   Period Weeks   Status On-going     PT SHORT TERM GOAL #5   Title improved knee extension to -12 degreee, and flexion to 110 degrees for improved gait   Time 4   Period Weeks   Status On-going           PT Long Term Goals - 11/24/16 1235      PT LONG TERM GOAL #1   Title independent with advanced HEP   Time 8   Period Weeks   Status On-going     PT LONG TERM GOAL #2   Title FOTO < or = to 43% limitation   Time 8   Period Weeks   Status On-going     PT LONG TERM GOAL #3   Title improved knee extension to 0 degreee, and flexion to 120 degrees for improved gait   Time 8   Period Weeks   Status On-going     PT LONG TERM GOAL #4   Title improved 5 x sit to stand to < or = to 12 seconds to  demonstrate improved strength and reduced risk of falls   Time 8   Period Weeks   Status On-going     PT LONG TERM GOAL #5   Title able to get dressed without assistance due to improved ROM and strengthe   Time 8   Period Weeks   Status On-going               Plan - 11/26/16 1025    Clinical Impression Statement Pt able to complete full revolutions on bike today. Continues to have antalgic gait but also having pain in Rt knee. Pt able to tolerate all strenghtening exercises well. Pt will continue to benefit from skilled therapy for knee strength and stability.    Rehab Potential Excellent   Clinical Impairments Affecting Rehab Potential Rt knee pain and OA, s/p Lt total knee replacement   PT Frequency 3x / week   PT Duration 8 weeks   PT Treatment/Interventions ADLs/Self Care Home Management;Biofeedback;Cryotherapy;Electrical Stimulation;Moist Heat;Gait training;Stair training;Functional mobility training;Therapeutic activities;Therapeutic exercise;Balance training;Neuromuscular re-education;Patient/family education;Manual techniques;Vasopneumatic Device;Passive range of motion   PT Next Visit Plan ROM knee flexion/extension, LE strengthening   Consulted and Agree with Plan of Care Patient      Patient will benefit from skilled therapeutic intervention in order to improve the following deficits and impairments:  Abnormal gait, Decreased activity tolerance, Difficulty walking, Decreased strength, Pain  Visit Diagnosis: Stiffness of left knee, not elsewhere classified  Muscle weakness (generalized)     Problem List Patient Active Problem List   Diagnosis Date Noted  . Anemia 11/01/2008  . Allergic rhinitis 11/01/2008  . Essential hypertension 09/03/2008  . OA (osteoarthritis) of knee 09/03/2008    Dessa Phi PTA 11/26/2016, 10:50 AM  [facility] [address], [address] [place], [place], [address] Phone:  [phone]   Fax:  [phone]  Name: Paula Kelley MRN: [id number] Date of Birth: 1951-06-21

## 2016-11-29 ENCOUNTER — Encounter: Payer: Self-pay | Admitting: Physical Therapy

## 2016-11-29 ENCOUNTER — Ambulatory Visit: Payer: BC Managed Care – PPO | Admitting: Physical Therapy

## 2016-11-29 DIAGNOSIS — M25662 Stiffness of left knee, not elsewhere classified: Secondary | ICD-10-CM | POA: Diagnosis not present

## 2016-11-29 DIAGNOSIS — M6281 Muscle weakness (generalized): Secondary | ICD-10-CM

## 2016-11-29 NOTE — Therapy (Signed)
[facility] [address], [address] [place], [place], [address] Phone: [phone]   Fax:  [phone]  Physical Therapy Treatment  Patient Details  Name: Paula Kelley MRN: [id number] Date of Birth: 21-Oct-1950 Referring Provider: Trudee Grip  Encounter Date: 11/29/2016      PT End of Session - 11/29/16 1221    Visit Number 4   Number of Visits 10   Date for PT Re-Evaluation February 03, 2017   Authorization Type g codes at 10th visit   PT Start Time 1222   PT Stop Time 1333   PT Time Calculation (min) 71 min   Activity Tolerance Patient tolerated treatment well   Behavior During Therapy [facility] for tasks assessed/performed      Past Medical History:  Diagnosis Date  . Allergic rhinitis   . Anxiety   . DJD (degenerative joint disease)    Sees Dr. Dierdre Forth  . Headache(784.0)   . Hypertension   . Mild anemia    iron def, reports on iron her whole life  . Obesity   . URI (upper respiratory infection)   . UTI (lower urinary tract infection)     Past Surgical History:  Procedure Laterality Date  . COLONOSCOPY    . svd     2 live births  . TOTAL KNEE ARTHROPLASTY Left 11/01/2016   Procedure: LEFT TOTAL KNEE ARTHROPLASTY;  Surgeon: Ollen Gross, MD;  Location: [facility];  Service: Orthopedics;  Laterality: Left;  Adductor Block    There were no vitals filed for this visit.      Subjective Assessment - 11/29/16 1223    Subjective Pt states knee is stiff still today.  Denies pain, "not really" having any pain.   Patient Stated Goals wants to get back to walking, but mainly do regular activities until gets the other knee done   Currently in Pain? No/denies                         [facility] Adult PT Treatment/Exercise - 11/29/16 0001      Therapeutic Activites    Therapeutic Activities Other Therapeutic Activities   Other Therapeutic Activities coordinating movment patterns for improved step through gait     Knee/Hip Exercises: Aerobic   Stationary Bike L0 x7 minutes  Able to get full revolutions bkwards no hip hiking     Knee/Hip Exercises: Machines for Strengthening   Total Gym Leg Press bilateal 50# 15x; single Lt LE 30# 2x10, Rt LE 1 plate 32G  quad set during extension with 5 sec hold     Knee/Hip Exercises: Standing   Knee Flexion AAROM;Left;10 reps  AAROM on step - flex/ext - 5sec holds     Knee/Hip Exercises: Supine   Short Arc Quad Sets Strengthening;2 sets;10 reps;Left  3 lb   Heel Slides Strengthening;Left;10 reps;PROM     Cryotherapy   Number Minutes Cryotherapy 15 Minutes  pillow under ankle for increased knee ext x 5 min   Cryotherapy Location Knee   Type of Cryotherapy Ice pack     Electrical Stimulation   Electrical Stimulation Location Left knee   Electrical Stimulation Action IFC   Electrical Stimulation Parameters to tolerance   Electrical Stimulation Goals Pain     Manual Therapy   Manual Therapy Muscle Energy Technique;Passive ROM   Passive ROM knee flexion   Muscle Energy Technique contract relax for knee flexion  PT Short Term Goals - 11/29/16 1323      PT SHORT TERM GOAL #1   Title independent with inital HEP   Time 4   Period Weeks   Status On-going     PT SHORT TERM GOAL #2   Title demonstrates increased knee flexion without Lt hip hike during gait   Period Weeks   Status On-going     PT SHORT TERM GOAL #3   Title demonstrates MMT of at least 4/5 Lt hip abduction, extension, and Lt knee flexion and extension.   Time 4   Period Weeks   Status On-going     PT SHORT TERM GOAL #4   Title reports 25% reduced pain during functional activities.   Time 4   Period Weeks   Status On-going           PT Long Term Goals - 11/24/16 1235      PT LONG TERM GOAL #1   Title independent with advanced HEP   Time 8   Period Weeks   Status On-going     PT LONG TERM GOAL #2   Title FOTO < or = to 43% limitation    Time 8   Period Weeks   Status On-going     PT LONG TERM GOAL #3   Title improved knee extension to 0 degreee, and flexion to 120 degrees for improved gait   Time 8   Period Weeks   Status On-going     PT LONG TERM GOAL #4   Title improved 5 x sit to stand to < or = to 12 seconds to demonstrate improved strength and reduced risk of falls   Time 8   Period Weeks   Status On-going     PT LONG TERM GOAL #5   Title able to get dressed without assistance due to improved ROM and strengthe   Time 8   Period Weeks   Status On-going               Plan - 11/29/16 1244    Clinical Impression Statement Able to complete full revolutions backwards without hip hike. Continues to have a lot of stiffness in knee and decreased ROM with knee flexion and extension that is effecting gait and funcitonal movements.  Needs verbal and tactile cues throughout to avoid co-contractions for improved functional movments like decreased hip hike during ambulation.  Benefit from skilled PT to address impairments   Rehab Potential Excellent   Clinical Impairments Affecting Rehab Potential Rt knee pain and OA, s/p Lt total knee replacement   PT Frequency 3x / week   PT Duration 8 weeks   PT Treatment/Interventions ADLs/Self Care Home Management;Biofeedback;Cryotherapy;Electrical Stimulation;Moist Heat;Gait training;Stair training;Functional mobility training;Therapeutic activities;Therapeutic exercise;Balance training;Neuromuscular re-education;Patient/family education;Manual techniques;Vasopneumatic Device;Passive range of motion   PT Next Visit Plan ROM knee flexion/extension, LE strengthening   Consulted and Agree with Plan of Care Patient      Patient will benefit from skilled therapeutic intervention in order to improve the following deficits and impairments:  Abnormal gait, Decreased activity tolerance, Difficulty walking, Decreased strength, Pain  Visit Diagnosis: Stiffness of left knee, not  elsewhere classified  Muscle weakness (generalized)     Problem List Patient Active Problem List   Diagnosis Date Noted  . Anemia 11/01/2008  . Allergic rhinitis 11/01/2008  . Essential hypertension 09/03/2008  . OA (osteoarthritis) of knee 09/03/2008    Vincente Poli, PT 11/29/2016, 2:05 PM  [facility] [address], [address] [place], [place], [address] Phone: [phone]   Fax:  [phone]  Name: Paula Kelley MRN: [id number] Date of Birth: Nov 15, 1950

## 2016-12-01 ENCOUNTER — Ambulatory Visit: Payer: BC Managed Care – PPO | Admitting: Physical Therapy

## 2016-12-01 DIAGNOSIS — M6281 Muscle weakness (generalized): Secondary | ICD-10-CM

## 2016-12-01 DIAGNOSIS — M25662 Stiffness of left knee, not elsewhere classified: Secondary | ICD-10-CM | POA: Diagnosis not present

## 2016-12-01 NOTE — Therapy (Signed)
[facility] [address], [address] [place], [place], [address] Phone: [phone]   Fax:  [phone]  Physical Therapy Treatment  Patient Details  Name: Paula Kelley MRN: [id number] Date of Birth: [birth year] Referring Provider: Trudee GripAlusio, Frank  Encounter Date: 12/01/2016      PT End of Session - 12/01/16 1311    Visit Number 5   Number of Visits 10   Date for PT Re-Evaluation 01/17/17   Authorization Type g codes at 10th visit   PT Start Time 1221   PT Stop Time 1320   PT Time Calculation (min) 59 min   Activity Tolerance Patient tolerated treatment well   Behavior During Therapy [facility] for tasks assessed/performed      Past Medical History:  Diagnosis Date  . Allergic rhinitis   . Anxiety   . DJD (degenerative joint disease)    Sees Dr. Dierdre ForthBeekman  . Headache(784.0)   . Hypertension   . Mild anemia    iron def, reports on iron her whole life  . Obesity   . URI (upper respiratory infection)   . UTI (lower urinary tract infection)     Past Surgical History:  Procedure Laterality Date  . COLONOSCOPY    . svd     2 live births  . TOTAL KNEE ARTHROPLASTY Left 11/01/2016   Procedure: LEFT TOTAL KNEE ARTHROPLASTY;  Surgeon: Ollen GrossFrank Aluisio, MD;  Location: [facility];  Service: Orthopedics;  Laterality: Left;  Adductor Block    There were no vitals filed for this visit.      Subjective Assessment - 12/01/16 1307    Subjective Has been sitting with sitting with knee bent more in sitting as apposed to sitting with LE up on recliner   Patient Stated Goals wants to get back to walking, but mainly do regular activities until gets the other knee done   Currently in Pain? No/denies                         [facility] Adult PT Treatment/Exercise - 12/01/16 0001      Knee/Hip Exercises: Aerobic   Stationary Bike --  Able to get full revolutions bkwards no hip hiking   Nustep L1 x 12  Therapist present to discuss  treatment     Knee/Hip Exercises: Machines for Strengthening   Total Gym Leg Press bilateal 50# 15x; single Lt LE 30# 2x10, Rt LE 1 plate [id number]  quad set during extension with 5 sec hold     Knee/Hip Exercises: Standing   Knee Flexion AAROM;Left;10 reps  AAROM on step - flex/ext - 5sec holds     Knee/Hip Exercises: Seated   Heel Slides AAROM;Left;5 reps  10 sec hold for flexion ROM     Knee/Hip Exercises: Supine   Quad Sets Strengthening;Left;10 reps     Vasopneumatic   Number Minutes Vasopneumatic  15 minutes   Vasopnuematic Location  Knee;Other (comment)  towel roll under ankle for knee ext x 3 min   Vasopneumatic Pressure Medium   Vasopneumatic Temperature  3 snow flakes     Manual Therapy   Manual Therapy Passive ROM   Passive ROM knee flexion   Muscle Energy Technique contract relax for knee flexion                  PT Short Term Goals - 11/29/16 1323      PT SHORT TERM GOAL #1   Title independent with inital  HEP   Time 4   Period Weeks   Status On-going     PT SHORT TERM GOAL #2   Title demonstrates increased knee flexion without Lt hip hike during gait   Period Weeks   Status On-going     PT SHORT TERM GOAL #3   Title demonstrates MMT of at least 4/5 Lt hip abduction, extension, and Lt knee flexion and extension.   Time 4   Period Weeks   Status On-going     PT SHORT TERM GOAL #4   Title reports 25% reduced pain during functional activities.   Time 4   Period Weeks   Status On-going           PT Long Term Goals - 11/24/16 1235      PT LONG TERM GOAL #1   Title independent with advanced HEP   Time 8   Period Weeks   Status On-going     PT LONG TERM GOAL #2   Title FOTO < or = to 43% limitation   Time 8   Period Weeks   Status On-going     PT LONG TERM GOAL #3   Title improved knee extension to 0 degreee, and flexion to 120 degrees for improved gait   Time 8   Period Weeks   Status On-going     PT LONG TERM GOAL #4   Title  improved 5 x sit to stand to < or = to 12 seconds to demonstrate improved strength and reduced risk of falls   Time 8   Period Weeks   Status On-going     PT LONG TERM GOAL #5   Title able to get dressed without assistance due to improved ROM and strengthe   Time 8   Period Weeks   Status On-going               Plan - 12/01/16 1314    Clinical Impression Statement Focusing on ROM and quad strength today.  Pt has good quad activity during contract relax.  Continues to have decreased ROM limiting her gait.  Skilled PT needed to address impairments for return to full function.   Rehab Potential Excellent   Clinical Impairments Affecting Rehab Potential Rt knee pain and OA, s/p Lt total knee replacement   PT Frequency 3x / week   PT Duration 8 weeks   PT Treatment/Interventions ADLs/Self Care Home Management;Biofeedback;Cryotherapy;Electrical Stimulation;Moist Heat;Gait training;Stair training;Functional mobility training;Therapeutic activities;Therapeutic exercise;Balance training;Neuromuscular re-education;Patient/family education;Manual techniques;Vasopneumatic Device;Passive range of motion   PT Next Visit Plan ROM knee flexion/extension, LE strengthening   Consulted and Agree with Plan of Care Patient      Patient will benefit from skilled therapeutic intervention in order to improve the following deficits and impairments:  Abnormal gait, Decreased activity tolerance, Difficulty walking, Decreased strength, Pain  Visit Diagnosis: Stiffness of left knee, not elsewhere classified  Muscle weakness (generalized)     Problem List Patient Active Problem List   Diagnosis Date Noted  . Anemia 11/01/2008  . Allergic rhinitis 11/01/2008  . Essential hypertension 09/03/2008  . OA (osteoarthritis) of knee 09/03/2008    Vincente Poli, PT 12/01/2016, 1:18 PM  [facility] [address], [address] [place], [place],  [address] Phone: [phone]   Fax:  [phone]  Name: Paula Kelley MRN: [id number] Date of Birth: [birth year]

## 2016-12-03 ENCOUNTER — Encounter: Payer: Self-pay | Admitting: Physical Therapy

## 2016-12-03 ENCOUNTER — Ambulatory Visit: Payer: BC Managed Care – PPO | Admitting: Physical Therapy

## 2016-12-03 DIAGNOSIS — M6281 Muscle weakness (generalized): Secondary | ICD-10-CM

## 2016-12-03 DIAGNOSIS — M25662 Stiffness of left knee, not elsewhere classified: Secondary | ICD-10-CM

## 2016-12-03 NOTE — Therapy (Signed)
[facility] [address], [address] [place], [place], [address] Phone: [phone]   Fax:  [phone]  Physical Therapy Treatment  Patient Details  Name: Paula Kelley MRN: [id number] Date of Birth: [birth year] Referring Provider: Trudee GripAlusio, Frank  Encounter Date: 12/03/2016      PT End of Session - 12/03/16 1054    Visit Number 6   Number of Visits 10   Date for PT Re-Evaluation 01/17/17   Authorization Type g codes at 10th visit   PT Start Time 1053   PT Stop Time 1152   PT Time Calculation (min) 59 min   Activity Tolerance Patient tolerated treatment well   Behavior During Therapy [facility] for tasks assessed/performed      Past Medical History:  Diagnosis Date  . Allergic rhinitis   . Anxiety   . DJD (degenerative joint disease)    Sees Dr. Dierdre ForthBeekman  . Headache(784.0)   . Hypertension   . Mild anemia    iron def, reports on iron her whole life  . Obesity   . URI (upper respiratory infection)   . UTI (lower urinary tract infection)     Past Surgical History:  Procedure Laterality Date  . COLONOSCOPY    . svd     2 live births  . TOTAL KNEE ARTHROPLASTY Left 11/01/2016   Procedure: LEFT TOTAL KNEE ARTHROPLASTY;  Surgeon: Ollen GrossFrank Aluisio, MD;  Location: [facility];  Service: Orthopedics;  Laterality: Left;  Adductor Block    There were no vitals filed for this visit.      Subjective Assessment - 12/03/16 1053    Subjective Stiff in the morning   Currently in Pain? No/denies                         [facility] Adult PT Treatment/Exercise - 12/03/16 0001      Knee/Hip Exercises: Aerobic   Stationary Bike L0 x6 minutes  ROM foward and backward   Nustep L1 x 6  Therapist present to discuss treatment     Knee/Hip Exercises: Machines for Strengthening   Total Gym Leg Press bilateal 50# 15x;  quad set during extension with 5 sec hold     Knee/Hip Exercises: Standing   Heel Raises 20 reps   Knee Flexion  AAROM;Left;10 reps  AAROM on step - flex/ext - 5sec holds   Other Standing Knee Exercises standing on rebounder 3 ways - 1 min each     Knee/Hip Exercises: Supine   Heel Slides Left;AROM;Strengthening;5 reps  10 sec hold     Vasopneumatic   Number Minutes Vasopneumatic  15 minutes   Vasopnuematic Location  Knee;Other (comment)  towel roll under ankle x 5 min   Vasopneumatic Pressure Medium   Vasopneumatic Temperature  3 snow flakes                  PT Short Term Goals - 11/29/16 1323      PT SHORT TERM GOAL #1   Title independent with inital HEP   Time 4   Period Weeks   Status On-going     PT SHORT TERM GOAL #2   Title demonstrates increased knee flexion without Lt hip hike during gait   Period Weeks   Status On-going     PT SHORT TERM GOAL #3   Title demonstrates MMT of at least 4/5 Lt hip abduction, extension, and Lt knee flexion and extension.   Time 4  Period Weeks   Status On-going     PT SHORT TERM GOAL #4   Title reports 25% reduced pain during functional activities.   Time 4   Period Weeks   Status On-going           PT Long Term Goals - 11/24/16 1235      PT LONG TERM GOAL #1   Title independent with advanced HEP   Time 8   Period Weeks   Status On-going     PT LONG TERM GOAL #2   Title FOTO < or = to 43% limitation   Time 8   Period Weeks   Status On-going     PT LONG TERM GOAL #3   Title improved knee extension to 0 degreee, and flexion to 120 degrees for improved gait   Time 8   Period Weeks   Status On-going     PT LONG TERM GOAL #4   Title improved 5 x sit to stand to < or = to 12 seconds to demonstrate improved strength and reduced risk of falls   Time 8   Period Weeks   Status On-going     PT LONG TERM GOAL #5   Title able to get dressed without assistance due to improved ROM and strengthe   Time 8   Period Weeks   Status On-going               Plan - 12/03/16 1055    Clinical Impression Statement  Able to perform exericses without increased pain.  Pt monitored throughout treatment.  Able to perform full revolutions backwards with minimal hip hike.  Pt had moderate hip hike forward.  PT to continue working on strength and ROM   Rehab Potential Excellent   Clinical Impairments Affecting Rehab Potential Rt knee pain and OA, s/p Lt total knee replacement   PT Frequency 3x / week   PT Duration 8 weeks   PT Treatment/Interventions ADLs/Self Care Home Management;Biofeedback;Cryotherapy;Electrical Stimulation;Moist Heat;Gait training;Stair training;Functional mobility training;Therapeutic activities;Therapeutic exercise;Balance training;Neuromuscular re-education;Patient/family education;Manual techniques;Vasopneumatic Device;Passive range of motion   PT Next Visit Plan ROM knee flexion/extension, LE strengthening   Consulted and Agree with Plan of Care Patient      Patient will benefit from skilled therapeutic intervention in order to improve the following deficits and impairments:  Abnormal gait, Decreased activity tolerance, Difficulty walking, Decreased strength, Pain  Visit Diagnosis: Stiffness of left knee, not elsewhere classified  Muscle weakness (generalized)     Problem List Patient Active Problem List   Diagnosis Date Noted  . Anemia 11/01/2008  . Allergic rhinitis 11/01/2008  . Essential hypertension 09/03/2008  . OA (osteoarthritis) of knee 09/03/2008    Vincente Poli, PT 12/03/2016, 11:46 AM  [facility] [address], [address] [place], [place], [address] Phone: [phone]   Fax:  [phone]  Name: Paula Kelley MRN: [id number] Date of Birth: 09/06/1951

## 2016-12-06 ENCOUNTER — Encounter: Payer: Self-pay | Admitting: Physical Therapy

## 2016-12-06 ENCOUNTER — Ambulatory Visit: Payer: BC Managed Care – PPO | Admitting: Physical Therapy

## 2016-12-06 DIAGNOSIS — M25662 Stiffness of left knee, not elsewhere classified: Secondary | ICD-10-CM | POA: Diagnosis not present

## 2016-12-06 DIAGNOSIS — M6281 Muscle weakness (generalized): Secondary | ICD-10-CM

## 2016-12-06 NOTE — Therapy (Signed)
[facility] [address], [address] [place], [place], [address] Phone: [phone]   Fax:  [phone]  Physical Therapy Treatment  Patient Details  Name: Paula Kelley MRN: [id number] Date of Birth: 1951-04-08 Referring Provider: Trudee Grip  Encounter Date: 12/06/2016      PT End of Session - 12/06/16 1232    Visit Number 7   Number of Visits 10   Date for PT Re-Evaluation 11-Feb-2017   Authorization Type g codes at 10th visit   PT Start Time 1142   PT Stop Time 1242   PT Time Calculation (min) 60 min   Activity Tolerance Patient tolerated treatment well   Behavior During Therapy [facility] for tasks assessed/performed      Past Medical History:  Diagnosis Date  . Allergic rhinitis   . Anxiety   . DJD (degenerative joint disease)    Sees Dr. Dierdre Forth  . Headache(784.0)   . Hypertension   . Mild anemia    iron def, reports on iron her whole life  . Obesity   . URI (upper respiratory infection)   . UTI (lower urinary tract infection)     Past Surgical History:  Procedure Laterality Date  . COLONOSCOPY    . svd     2 live births  . TOTAL KNEE ARTHROPLASTY Left 11/01/2016   Procedure: LEFT TOTAL KNEE ARTHROPLASTY;  Surgeon: Ollen Gross, MD;  Location: [facility];  Service: Orthopedics;  Laterality: Left;  Adductor Block    There were no vitals filed for this visit.      Subjective Assessment - 12/06/16 1151    Subjective No pain just stiffness   Patient Stated Goals wants to get back to walking, but mainly do regular activities until gets the other knee done   Currently in Pain? No/denies                         [facility] Adult PT Treatment/Exercise - 12/06/16 0001      Knee/Hip Exercises: Aerobic   Nustep L1 x 11 min  Therapist present to discuss treatment     Knee/Hip Exercises: Standing   Terminal Knee Extension Limitations red band - 30x     Knee/Hip Exercises: Seated   Long Arc Quad  Strengthening;Left;3 sets;10 reps;Weights   Long Arc Quad Weight 3 lbs.   Knee/Hip Flexion knee flexion seated with red band     Knee/Hip Exercises: Supine   Heel Slides AROM;Left;10 reps   Straight Leg Raises Strengthening;Left;2 sets;10 reps  1 lb     Knee/Hip Exercises: Sidelying   Hip ABduction Strengthening;Left;15 reps     Vasopneumatic   Number Minutes Vasopneumatic  15 minutes   Vasopnuematic Location  Knee   Vasopneumatic Pressure Medium   Vasopneumatic Temperature  3 snow flakes     Manual Therapy   Soft tissue mobilization scar massage, left knee and surrounding tissue                  PT Short Term Goals - 12/06/16 1233      PT SHORT TERM GOAL #1   Title independent with inital HEP   Time 4   Period Weeks   Status Achieved     PT SHORT TERM GOAL #2   Title demonstrates increased knee flexion without Lt hip hike during gait   Time 4   Period Weeks   Status Achieved     PT SHORT TERM GOAL #3  Title demonstrates MMT of at least 4/5 Lt hip abduction, extension, and Lt knee flexion and extension.   Time 4   Period Weeks   Status On-going     PT SHORT TERM GOAL #4   Title reports 25% reduced pain during functional activities.   Time 4   Period Weeks   Status On-going     PT SHORT TERM GOAL #5   Title improved knee extension to -12 degreee, and flexion to 110 degrees for improved gait   Time 4   Period Weeks   Status On-going           PT Long Term Goals - 11/24/16 1235      PT LONG TERM GOAL #1   Title independent with advanced HEP   Time 8   Period Weeks   Status On-going     PT LONG TERM GOAL #2   Title FOTO < or = to 43% limitation   Time 8   Period Weeks   Status On-going     PT LONG TERM GOAL #3   Title improved knee extension to 0 degreee, and flexion to 120 degrees for improved gait   Time 8   Period Weeks   Status On-going     PT LONG TERM GOAL #4   Title improved 5 x sit to stand to < or = to 12 seconds to  demonstrate improved strength and reduced risk of falls   Time 8   Period Weeks   Status On-going     PT LONG TERM GOAL #5   Title able to get dressed without assistance due to improved ROM and strengthe   Time 8   Period Weeks   Status On-going               Plan - 12/06/16 1236    Clinical Impression Statement Continues to demonstrate improved strength able to perform LAQ with 3lb.  Knee ROM flexion 98 degrees and continues to have swelling.  Skilled PT needed for improved strength and ROM   Rehab Potential Excellent   Clinical Impairments Affecting Rehab Potential Rt knee pain and OA, s/p Lt total knee replacement   PT Frequency 3x / week   PT Duration 8 weeks   PT Treatment/Interventions ADLs/Self Care Home Management;Biofeedback;Cryotherapy;Electrical Stimulation;Moist Heat;Gait training;Stair training;Functional mobility training;Therapeutic activities;Therapeutic exercise;Balance training;Neuromuscular re-education;Patient/family education;Manual techniques;Vasopneumatic Device;Passive range of motion   PT Next Visit Plan ROM knee flexion/extension, LE strengthening   Consulted and Agree with Plan of Care Patient      Patient will benefit from skilled therapeutic intervention in order to improve the following deficits and impairments:  Abnormal gait, Decreased activity tolerance, Difficulty walking, Decreased strength, Pain  Visit Diagnosis: Stiffness of left knee, not elsewhere classified  Muscle weakness (generalized)     Problem List Patient Active Problem List   Diagnosis Date Noted  . Anemia 11/01/2008  . Allergic rhinitis 11/01/2008  . Essential hypertension 09/03/2008  . OA (osteoarthritis) of knee 09/03/2008    Vincente PoliJakki Crosser, PT 12/06/2016, 12:47 PM  [facility] [address], [address] [place], [place], [address] Phone: [phone]   Fax:  [phone]  Name: Paula Kelley MRN:  [id number] Date of Birth: Feb 24, 1951

## 2016-12-08 ENCOUNTER — Encounter: Payer: Self-pay | Admitting: Physical Therapy

## 2016-12-08 ENCOUNTER — Ambulatory Visit: Payer: BC Managed Care – PPO | Admitting: Physical Therapy

## 2016-12-08 DIAGNOSIS — M6281 Muscle weakness (generalized): Secondary | ICD-10-CM

## 2016-12-08 DIAGNOSIS — M25662 Stiffness of left knee, not elsewhere classified: Secondary | ICD-10-CM

## 2016-12-08 NOTE — Therapy (Signed)
[facility] [address] [place], [place], [address] Phone: [phone]   Fax:  [phone]  Physical Therapy Treatment  Patient Details  Name: Paula Kelley MRN: [id number] Date of Birth: 04-Jul-1951 Referring Provider: Trudee Grip  Encounter Date: 12/08/2016      PT End of Session - 12/08/16 1228    Visit Number 8   Number of Visits 10   Date for PT Re-Evaluation 01/25/2017   Authorization Type g codes at 10th visit   PT Start Time 1225   PT Stop Time 1315   PT Time Calculation (min) 50 min   Activity Tolerance Patient tolerated treatment well   Behavior During Therapy [facility] for tasks assessed/performed      Past Medical History:  Diagnosis Date  . Allergic rhinitis   . Anxiety   . DJD (degenerative joint disease)    Sees Dr. Dierdre Forth  . Headache(784.0)   . Hypertension   . Mild anemia    iron def, reports on iron her whole life  . Obesity   . URI (upper respiratory infection)   . UTI (lower urinary tract infection)     Past Surgical History:  Procedure Laterality Date  . COLONOSCOPY    . svd     2 live births  . TOTAL KNEE ARTHROPLASTY Left 11/01/2016   Procedure: LEFT TOTAL KNEE ARTHROPLASTY;  Surgeon: Ollen Gross, MD;  Location: [facility];  Service: Orthopedics;  Laterality: Left;  Adductor Block    There were no vitals filed for this visit.      Subjective Assessment - 12/08/16 1226    Subjective Knee feeling stiff today   Patient Stated Goals wants to get back to walking, but mainly do regular activities until gets the other knee done   Currently in Pain? No/denies   Pain Score 0-No pain   Pain Location Knee   Pain Orientation Left   Pain Descriptors / Indicators Aching   Pain Type Surgical pain   Pain Onset More than a month ago   Pain Frequency Intermittent   Aggravating Factors  not sure because its getting better   Pain Relieving Factors Tylenol, ice, weaning off of pain medicine    Effect of Pain on Daily Activities now can sleep, has trouble getting clothers, haven't been doing a lot of cooking/ cleaning            [facility] PT Assessment - 12/08/16 0001      AROM   Left Knee Flexion 103                     [facility] Adult PT Treatment/Exercise - 12/08/16 0001      Knee/Hip Exercises: Aerobic   Stationary Bike L0 x6 minutes  ROM foward and backward     Knee/Hip Exercises: Machines for Strengthening   Total Gym Leg Press Bil #60 2x10, Rt #35 2x10  Seat 6 3x10     Knee/Hip Exercises: Standing   Hip Flexion Stengthening;Both;2 sets;10 reps   Hip Abduction Stengthening;Both;2 sets;10 reps   Hip Extension Stengthening;Both;2 sets;10 reps   Forward Step Up --   Rebounder Marching, SLS     Knee/Hip Exercises: Seated   Long Arc Quad --   Long Arc Quad [employer] --     Knee/Hip Exercises: Supine   Heel Slides AROM;Left;10 reps     Vasopneumatic   Number Minutes Vasopneumatic  15 minutes   Vasopnuematic Location  Knee   Vasopneumatic Pressure Medium  Vasopneumatic Temperature  3 snow flakes                  PT Short Term Goals - 12/06/16 1233      PT SHORT TERM GOAL #1   Title independent with inital HEP   Time 4   Period Weeks   Status Achieved     PT SHORT TERM GOAL #2   Title demonstrates increased knee flexion without Lt hip hike during gait   Time 4   Period Weeks   Status Achieved     PT SHORT TERM GOAL #3   Title demonstrates MMT of at least 4/5 Lt hip abduction, extension, and Lt knee flexion and extension.   Time 4   Period Weeks   Status On-going     PT SHORT TERM GOAL #4   Title reports 25% reduced pain during functional activities.   Time 4   Period Weeks   Status On-going     PT SHORT TERM GOAL #5   Title improved knee extension to -12 degreee, and flexion to 110 degrees for improved gait   Time 4   Period Weeks   Status On-going           PT Long Term Goals - 11/24/16 1235      PT LONG TERM  GOAL #1   Title independent with advanced HEP   Time 8   Period Weeks   Status On-going     PT LONG TERM GOAL #2   Title FOTO < or = to 43% limitation   Time 8   Period Weeks   Status On-going     PT LONG TERM GOAL #3   Title improved knee extension to 0 degreee, and flexion to 120 degrees for improved gait   Time 8   Period Weeks   Status On-going     PT LONG TERM GOAL #4   Title improved 5 x sit to stand to < or = to 12 seconds to demonstrate improved strength and reduced risk of falls   Time 8   Period Weeks   Status On-going     PT LONG TERM GOAL #5   Title able to get dressed without assistance due to improved ROM and strengthe   Time 8   Period Weeks   Status On-going               Plan - 12/08/16 1308    Clinical Impression Statement Pt able to tolerate all standing exercises well with some muscle fatigue. Continue to have limited ROM. Pt will continue to benefit from skilled therapy for knee strength and stability.    Rehab Potential Excellent   Clinical Impairments Affecting Rehab Potential Rt knee pain and OA, s/p Lt total knee replacement   PT Frequency 3x / week   PT Duration 8 weeks   PT Treatment/Interventions ADLs/Self Care Home Management;Biofeedback;Cryotherapy;Electrical Stimulation;Moist Heat;Gait training;Stair training;Functional mobility training;Therapeutic activities;Therapeutic exercise;Balance training;Neuromuscular re-education;Patient/family education;Manual techniques;Vasopneumatic Device;Passive range of motion   PT Next Visit Plan ROM knee flexion/extension, LE strengthening   Consulted and Agree with Plan of Care Patient      Patient will benefit from skilled therapeutic intervention in order to improve the following deficits and impairments:  Abnormal gait, Decreased activity tolerance, Difficulty walking, Decreased strength, Pain  Visit Diagnosis: Stiffness of left knee, not elsewhere classified  Muscle weakness  (generalized)     Problem List Patient Active Problem List   Diagnosis Date Noted  . Anemia  11/01/2008  . Allergic rhinitis 11/01/2008  . Essential hypertension 09/03/2008  . OA (osteoarthritis) of knee 09/03/2008    Paula Kelley PTA 12/08/2016, 1:21 PM   [facility] [address], [address] [place], [place], [address] Phone: [phone]   Fax:  [phone]  Name: Paula Kelley MRN: [id number] Date of Birth: December 22, 1950

## 2016-12-10 ENCOUNTER — Ambulatory Visit: Payer: BC Managed Care – PPO | Admitting: Physical Therapy

## 2016-12-10 ENCOUNTER — Encounter: Payer: Self-pay | Admitting: Physical Therapy

## 2016-12-10 DIAGNOSIS — M25662 Stiffness of left knee, not elsewhere classified: Secondary | ICD-10-CM

## 2016-12-10 DIAGNOSIS — M6281 Muscle weakness (generalized): Secondary | ICD-10-CM

## 2016-12-10 NOTE — Therapy (Signed)
[facility] [address], [address] [place], [place], [address] Phone: [phone]   Fax:  [phone]  Physical Therapy Treatment  Patient Details  Name: Paula Kelley MRN: [id number] Date of Birth: January 20, 1951 Referring Provider: Trudee Grip  Encounter Date: 12/10/2016      PT End of Session - 12/10/16 1017    Visit Number 9   Number of Visits 10   Date for PT Re-Evaluation 01/24/2017   Authorization Type g codes at 10th visit   PT Start Time 1010   PT Stop Time 1110   PT Time Calculation (min) 60 min   Activity Tolerance Patient tolerated treatment well   Behavior During Therapy [facility] for tasks assessed/performed      Past Medical History:  Diagnosis Date  . Allergic rhinitis   . Anxiety   . DJD (degenerative joint disease)    Sees Dr. Dierdre Forth  . Headache(784.0)   . Hypertension   . Mild anemia    iron def, reports on iron her whole life  . Obesity   . URI (upper respiratory infection)   . UTI (lower urinary tract infection)     Past Surgical History:  Procedure Laterality Date  . COLONOSCOPY    . svd     2 live births  . TOTAL KNEE ARTHROPLASTY Left 11/01/2016   Procedure: LEFT TOTAL KNEE ARTHROPLASTY;  Surgeon: Ollen Gross, MD;  Location: [facility];  Service: Orthopedics;  Laterality: Left;  Adductor Block    There were no vitals filed for this visit.      Subjective Assessment - 12/10/16 1014    Subjective Knee stiff. Pt stated she saw MD yesterday and pt stated MD told her she can finish therapy at home after next week,    Patient Stated Goals wants to get back to walking, but mainly do regular activities until gets the other knee done   Currently in Pain? No/denies   Pain Score 0-No pain                         [facility] Adult PT Treatment/Exercise - 12/10/16 0001      Therapeutic Activites    Therapeutic Activities Other Therapeutic Activities   Other Therapeutic Activities Gait  training with ladder  Verbal cues for heel strike and knee bend     Knee/Hip Exercises: Stretches   Gastroc Stretch Left;2 reps;10 seconds     Knee/Hip Exercises: Aerobic   Stationary Bike L0 x8 minutes  ROM foward and backward     Knee/Hip Exercises: Machines for Strengthening   Total Gym Leg Press Bil #60 2x10, LT #35 2x10  Seat 6 3x10     Knee/Hip Exercises: Standing   Knee Flexion AAROM;Left;10 reps  AAROM on step - flex/ext - 5sec holds   Hip Flexion Stengthening;Both;1 set;15 reps   Hip Abduction Stengthening;Both;1 set;15 reps   Hip Extension Stengthening;Both;1 set;15 reps   Forward Step Up Both;2 sets;10 reps;Step Height: 6"  Verbal cues to push through leg   Rebounder Marching, SLS     Knee/Hip Exercises: Seated   Long Arc Quad --   Long Arc [employer] --     Knee/Hip Exercises: Supine   Heel Slides AROM;Left;10 reps     Modalities   Modalities Vasopneumatic     Vasopneumatic   Number Minutes Vasopneumatic  15 minutes   Vasopnuematic Location  Knee   Vasopneumatic Pressure Medium   Vasopneumatic Temperature  3 snow  flakes                  PT Short Term Goals - 12/06/16 1233      PT SHORT TERM GOAL #1   Title independent with inital HEP   Time 4   Period Weeks   Status Achieved     PT SHORT TERM GOAL #2   Title demonstrates increased knee flexion without Lt hip hike during gait   Time 4   Period Weeks   Status Achieved     PT SHORT TERM GOAL #3   Title demonstrates MMT of at least 4/5 Lt hip abduction, extension, and Lt knee flexion and extension.   Time 4   Period Weeks   Status On-going     PT SHORT TERM GOAL #4   Title reports 25% reduced pain during functional activities.   Time 4   Period Weeks   Status On-going     PT SHORT TERM GOAL #5   Title improved knee extension to -12 degreee, and flexion to 110 degrees for improved gait   Time 4   Period Weeks   Status On-going           PT Long Term Goals - 11/24/16 1235       PT LONG TERM GOAL #1   Title independent with advanced HEP   Time 8   Period Weeks   Status On-going     PT LONG TERM GOAL #2   Title FOTO < or = to 43% limitation   Time 8   Period Weeks   Status On-going     PT LONG TERM GOAL #3   Title improved knee extension to 0 degreee, and flexion to 120 degrees for improved gait   Time 8   Period Weeks   Status On-going     PT LONG TERM GOAL #4   Title improved 5 x sit to stand to < or = to 12 seconds to demonstrate improved strength and reduced risk of falls   Time 8   Period Weeks   Status On-going     PT LONG TERM GOAL #5   Title able to get dressed without assistance due to improved ROM and strengthe   Time 8   Period Weeks   Status On-going               Plan - 12/10/16 1018    Clinical Impression Statement Pt continues to have antalgic gait with limited knee flexion during gait. Pt needing verbal cues for proper gait pattern. Pt progressing well with knee strength and stability. Will continue to benefit from skilled therapy for knee strength and ROM.    Rehab Potential Excellent   Clinical Impairments Affecting Rehab Potential Rt knee pain and OA, s/p Lt total knee replacement   PT Frequency 3x / week   PT Duration 8 weeks   PT Treatment/Interventions ADLs/Self Care Home Management;Biofeedback;Cryotherapy;Electrical Stimulation;Moist Heat;Gait training;Stair training;Functional mobility training;Therapeutic activities;Therapeutic exercise;Balance training;Neuromuscular re-education;Patient/family education;Manual techniques;Vasopneumatic Device;Passive range of motion   PT Next Visit Plan Discuss decreaseing frequency but continueing with therapy   Consulted and Agree with Plan of Care Patient      Patient will benefit from skilled therapeutic intervention in order to improve the following deficits and impairments:  Abnormal gait, Decreased activity tolerance, Difficulty walking, Decreased strength,  Pain  Visit Diagnosis: Stiffness of left knee, not elsewhere classified  Muscle weakness (generalized)     Problem List Patient Active Problem List  Diagnosis Date Noted  . Anemia 11/01/2008  . Allergic rhinitis 11/01/2008  . Essential hypertension 09/03/2008  . OA (osteoarthritis) of knee 09/03/2008    Dessa PhiKatherine Matthews PTA 12/10/2016, 11:01 AM  [facility] [address] [place], [place], [address] Phone: [phone]   Fax:  [phone]  Name: Voncille LoOlga L Bruck MRN: [id number] Date of Birth: June 18, 1951

## 2016-12-13 ENCOUNTER — Encounter: Payer: Self-pay | Admitting: Physical Therapy

## 2016-12-13 ENCOUNTER — Ambulatory Visit: Payer: BC Managed Care – PPO | Admitting: Physical Therapy

## 2016-12-13 DIAGNOSIS — M25662 Stiffness of left knee, not elsewhere classified: Secondary | ICD-10-CM | POA: Diagnosis not present

## 2016-12-13 DIAGNOSIS — M6281 Muscle weakness (generalized): Secondary | ICD-10-CM

## 2016-12-13 NOTE — Therapy (Signed)
[facility] [address], [place], [place], [address] Phone: [phone]   Fax:  [phone]  Physical Therapy Treatment  Patient Details  Name: Paula Kelley MRN: [id number] Date of Birth: 09/14/1951 Referring Provider: Hector Shade  Encounter Date: 12/13/2016      PT End of Session - 12/13/16 1231    Visit Number 10   Number of Visits 20   Date for PT Re-Evaluation 01-30-2017   Authorization Type g codes at 01-18-2023 visit   PT Start Time 1225   PT Stop Time 1322   PT Time Calculation (min) 57 min   Activity Tolerance Patient tolerated treatment well   Behavior During Therapy [facility] for tasks assessed/performed      Past Medical History:  Diagnosis Date  . Allergic rhinitis   . Anxiety   . DJD (degenerative joint disease)    Sees Dr. Amil Amen  . Headache(784.0)   . Hypertension   . Mild anemia    iron def, reports on iron her whole life  . Obesity   . URI (upper respiratory infection)   . UTI (lower urinary tract infection)     Past Surgical History:  Procedure Laterality Date  . COLONOSCOPY    . svd     2 live births  . TOTAL KNEE ARTHROPLASTY Left 11/01/2016   Procedure: LEFT TOTAL KNEE ARTHROPLASTY;  Surgeon: Gaynelle Arabian, MD;  Location: [facility];  Service: Orthopedics;  Laterality: Left;  Adductor Block    There were no vitals filed for this visit.      Subjective Assessment - 12/13/16 1228    Subjective knee is stiff. States she had ibuprophen and ice before she came today.  Reports she is feeling comfortable to do exercises at home after the next week.   Currently in Pain? No/denies            [facility] PT Assessment - 12/13/16 0001      AROM   Left Knee Extension -12   Left Knee Flexion 102     Strength   Left Hip Flexion 4/5   Left Hip Extension 4/5   Left Hip ABduction 4/5   Left Knee Flexion 4/5   Left Knee Extension 4/5                     [facility] Adult PT  Treatment/Exercise - 12/13/16 0001      Ambulation/Gait   Ambulation/Gait Yes   Ambulation/Gait Assistance 7: Independent   Gait Pattern Decreased stride length;Left hip hike;Lateral trunk lean to right;Left flexed knee in stance  decreased heel strike, decreased hip extension   Gait Comments educated on gait deficits and verbal and visual cues to improve gait - pt performed and able to demonstrate understanding of improved gait.     Knee/Hip Exercises: Aerobic   Stationary Bike L0 x8 minutes  ROM foward and backward   Nustep L1 x 8 min  Therapist present to discuss treatment     Knee/Hip Exercises: Machines for Strengthening   Total Gym Leg Press Bil #70 3x10, LT #35 x10  Seat 6 3x10     Vasopneumatic   Number Minutes Vasopneumatic  15 minutes   Vasopnuematic Location  Knee   Vasopneumatic Pressure Medium   Vasopneumatic Temperature  3 snow flakes                  PT Short Term Goals - 12/13/16 1251      PT SHORT  TERM GOAL #1   Title independent with inital HEP   Time 4   Period Weeks   Status Achieved     PT SHORT TERM GOAL #2   Title demonstrates increased knee flexion without Lt hip hike during gait   Time 4   Period Weeks   Status Achieved     PT SHORT TERM GOAL #3   Title demonstrates MMT of at least 4/5 Lt hip abduction, extension, and Lt knee flexion and extension.   Time 4   Period Weeks   Status Achieved     PT SHORT TERM GOAL #4   Title reports 25% reduced pain during functional activities.   Time 4   Period Weeks   Status Achieved     PT SHORT TERM GOAL #5   Baseline -12 deg ext; 102 deg flex   Time 4   Period Weeks   Status Partially Met           PT Long Term Goals - 2017-01-02 1251      PT LONG TERM GOAL #1   Title independent with advanced HEP   Time 8   Period Weeks   Status On-going     PT LONG TERM GOAL #2   Title FOTO < or = to 43% limitation   Baseline 47%   Time 8   Period Weeks   Status On-going     PT LONG  TERM GOAL #3   Title improved knee extension to 0 degreee, and flexion to 120 degrees for improved gait   Period Weeks   Status On-going     PT LONG TERM GOAL #4   Title improved 5 x sit to stand to < or = to 12 seconds to demonstrate improved strength and reduced risk of falls   Time 8   Period Weeks   Status On-going     PT LONG TERM GOAL #5   Title able to get dressed without assistance due to improved ROM and strengthe   Time 8   Period Weeks   Status Achieved               Plan - 01/02/17 1325    Clinical Impression Statement continues to need cues for improved gait . Able to demonstrate improved ROM and decreased pain.  Progressed withincreaesd resistance on leg press.  skilled PT to strength and increased ROM and ensure successful transition to HEP.   Rehab Potential Excellent   Clinical Impairments Affecting Rehab Potential Rt knee pain and OA, s/p Lt total knee replacement   PT Frequency 3x / week   PT Duration 8 weeks   PT Treatment/Interventions ADLs/Self Care Home Management;Biofeedback;Cryotherapy;Electrical Stimulation;Moist Heat;Gait training;Stair training;Functional mobility training;Therapeutic activities;Therapeutic exercise;Balance training;Neuromuscular re-education;Patient/family education;Manual techniques;Vasopneumatic Device;Passive range of motion   PT Next Visit Plan discharge at the end of this week, make sure patient is self sufficient with HEP and demonstrates good gait mechanics   Consulted and Agree with Plan of Care Patient      Patient will benefit from skilled therapeutic intervention in order to improve the following deficits and impairments:  Abnormal gait, Decreased activity tolerance, Difficulty walking, Decreased strength, Pain  Visit Diagnosis: Stiffness of left knee, not elsewhere classified  Muscle weakness (generalized)       G-Codes - 01-02-17 1319    Functional Assessment Tool Used (Outpatient Only) FOTO, clinical  reasoning   Functional Limitation Mobility: Walking and moving around   Mobility: Walking and Moving Around Current Status [phone])  At least 40 percent but less than 60 percent impaired, limited or restricted   Mobility: Walking and Moving Around Goal Status [phone]) At least 40 percent but less than 60 percent impaired, limited or restricted      Problem List Patient Active Problem List   Diagnosis Date Noted  . Anemia 11/01/2008  . Allergic rhinitis 11/01/2008  . Essential hypertension 09/03/2008  . OA (osteoarthritis) of knee 09/03/2008    Zannie Cove, PT 12/13/2016, 1:29 PM  [facility] [address], [place], [place], [address] Phone: [phone]   Fax:  [phone]  Name: Paula Kelley MRN: [id number] Date of Birth: [birth year]

## 2016-12-15 ENCOUNTER — Ambulatory Visit: Payer: BC Managed Care – PPO | Admitting: Physical Therapy

## 2016-12-15 ENCOUNTER — Encounter: Payer: Self-pay | Admitting: Physical Therapy

## 2016-12-15 DIAGNOSIS — M6281 Muscle weakness (generalized): Secondary | ICD-10-CM

## 2016-12-15 DIAGNOSIS — M25662 Stiffness of left knee, not elsewhere classified: Secondary | ICD-10-CM | POA: Diagnosis not present

## 2016-12-15 NOTE — Therapy (Signed)
[facility] [address] [place], [place], [address] Phone: [phone]   Fax:  [phone]  Physical Therapy Treatment  Patient Details  Name: Paula Kelley MRN: [id number] Date of Birth: [birth year] Referring Provider: Hector Shade  Encounter Date: 12/15/2016      PT End of Session - 12/15/16 1018    Visit Number 11   Number of Visits 20   Date for PT Re-Evaluation 01-23-17   Authorization Type g codes at 11/12/22 visit   PT Start Time 1015   PT Stop Time 1110   PT Time Calculation (min) 55 min   Activity Tolerance Patient tolerated treatment well   Behavior During Therapy [facility] for tasks assessed/performed      Past Medical History:  Diagnosis Date  . Allergic rhinitis   . Anxiety   . DJD (degenerative joint disease)    Sees Dr. Amil Amen  . Headache(784.0)   . Hypertension   . Mild anemia    iron def, reports on iron her whole life  . Obesity   . URI (upper respiratory infection)   . UTI (lower urinary tract infection)     Past Surgical History:  Procedure Laterality Date  . COLONOSCOPY    . svd     2 live births  . TOTAL KNEE ARTHROPLASTY Left 11/01/2016   Procedure: LEFT TOTAL KNEE ARTHROPLASTY;  Surgeon: Gaynelle Arabian, MD;  Location: [facility];  Service: Orthopedics;  Laterality: Left;  Adductor Block    There were no vitals filed for this visit.      Subjective Assessment - 12/15/16 1017    Subjective Knee stiff this AM.   Currently in Pain? No/denies   Pain Descriptors / Indicators Tightness   Multiple Pain Sites No                         [facility] Adult PT Treatment/Exercise - 12/15/16 0001      Knee/Hip Exercises: Stretches   Gastroc Stretch Left;3 reps;10 seconds  VC for technique/correct positioning     Knee/Hip Exercises: Aerobic   Stationary Bike Rocking x 10mn, 4 min backwards then 4 min forward  End of tx: 4 min with RPMS above 30   Nustep L1 x 8 min  Therapist  present to discuss treatment     Knee/Hip Exercises: Machines for Strengthening   Total Gym Leg Press Lt only 35# 3x10     Knee/Hip Exercises: Standing   Forward Step Up Both;2 sets;10 reps;Step Height: 6"  Verbal cues to push through leg and to not circumduct     Vasopneumatic   Number Minutes Vasopneumatic  15 minutes   Vasopnuematic Location  Knee   Vasopneumatic Pressure High   Vasopneumatic Temperature  3 snow flakes                  PT Short Term Goals - 12/13/16 1251      PT SHORT TERM GOAL #1   Title independent with inital HEP   Time 4   Period Weeks   Status Achieved     PT SHORT TERM GOAL #2   Title demonstrates increased knee flexion without Lt hip hike during gait   Time 4   Period Weeks   Status Achieved     PT SHORT TERM GOAL #3   Title demonstrates MMT of at least 4/5 Lt hip abduction, extension, and Lt knee flexion and extension.   Time 4  Period Weeks   Status Achieved     PT SHORT TERM GOAL #4   Title reports 25% reduced pain during functional activities.   Time 4   Period Weeks   Status Achieved     PT SHORT TERM GOAL #5   Baseline -12 deg ext; 102 deg flex   Time 4   Period Weeks   Status Partially Met           PT Long Term Goals - 12/13/16 1251      PT LONG TERM GOAL #1   Title independent with advanced HEP   Time 8   Period Weeks   Status On-going     PT LONG TERM GOAL #2   Title FOTO < or = to 43% limitation   Baseline 47%   Time 8   Period Weeks   Status On-going     PT LONG TERM GOAL #3   Title improved knee extension to 0 degreee, and flexion to 120 degrees for improved gait   Period Weeks   Status On-going     PT LONG TERM GOAL #4   Title improved 5 x sit to stand to < or = to 12 seconds to demonstrate improved strength and reduced risk of falls   Time 8   Period Weeks   Status On-going     PT LONG TERM GOAL #5   Title able to get dressed without assistance due to improved ROM and strengthe    Time 8   Period Weeks   Status Achieved               Plan - 12/15/16 1018    Clinical Impression Statement Pt's biggest complaint is the morning stiffness in her knee. She reported today she will look into getting a bike for home. Pt needed frequent verbal cuing to increase proprioception, tends to stand with more wt on the RT.    Rehab Potential Excellent   Clinical Impairments Affecting Rehab Potential Rt knee pain and OA, s/p Lt total knee replacement   PT Frequency 3x / week   PT Duration 8 weeks   PT Treatment/Interventions ADLs/Self Care Home Management;Biofeedback;Cryotherapy;Electrical Stimulation;Moist Heat;Gait training;Stair training;Functional mobility training;Therapeutic activities;Therapeutic exercise;Balance training;Neuromuscular re-education;Patient/family education;Manual techniques;Vasopneumatic Device;Passive range of motion   PT Next Visit Plan Pt would like Friday to be her last day.   Consulted and Agree with Plan of Care Patient      Patient will benefit from skilled therapeutic intervention in order to improve the following deficits and impairments:  Abnormal gait, Decreased activity tolerance, Difficulty walking, Decreased strength, Pain  Visit Diagnosis: Stiffness of left knee, not elsewhere classified  Muscle weakness (generalized)     Problem List Patient Active Problem List   Diagnosis Date Noted  . Anemia 11/01/2008  . Allergic rhinitis 11/01/2008  . Essential hypertension 09/03/2008  . OA (osteoarthritis) of knee 09/03/2008    COCHRAN,JENNIFER 12/15/2016, 10:56 AM  [facility] [address] [place], [place], [address] Phone: [phone]   Fax:  [phone]  Name: Paula Kelley MRN: [id number] Date of Birth: Oct 01, 1951

## 2016-12-17 ENCOUNTER — Ambulatory Visit: Payer: Medicare Other | Attending: Orthopedic Surgery | Admitting: Physical Therapy

## 2016-12-17 ENCOUNTER — Encounter: Payer: Self-pay | Admitting: Physical Therapy

## 2016-12-17 DIAGNOSIS — M25662 Stiffness of left knee, not elsewhere classified: Secondary | ICD-10-CM | POA: Insufficient documentation

## 2016-12-17 DIAGNOSIS — M6281 Muscle weakness (generalized): Secondary | ICD-10-CM

## 2016-12-24 NOTE — Therapy (Signed)
[facility] [address], [place], [place], [address] Phone: [phone]   Fax:  [phone]  Physical Therapy Treatment  Patient Details  Name: Paula Kelley MRN: [id number] Date of Birth: 1951-01-24 Referring Provider: Hector Shade  Encounter Date: 12/17/2016      PT End of Session - 12/24/16 1007    Visit Number 12   Number of Visits 20   Date for PT Re-Evaluation 13-Feb-2017   Authorization Type g codes at Jan 03, 2023 visit   Activity Tolerance Patient tolerated treatment well   Behavior During Therapy [facility] for tasks assessed/performed    Time in: 1010 Time out: 1110 Total time: 60 min  Past Medical History:  Diagnosis Date  . Allergic rhinitis   . Anxiety   . DJD (degenerative joint disease)    Sees Dr. Amil Amen  . Headache(784.0)   . Hypertension   . Mild anemia    iron def, reports on iron her whole life  . Obesity   . URI (upper respiratory infection)   . UTI (lower urinary tract infection)     Past Surgical History:  Procedure Laterality Date  . COLONOSCOPY    . svd     2 live births  . TOTAL KNEE ARTHROPLASTY Left 11/01/2016   Procedure: LEFT TOTAL KNEE ARTHROPLASTY;  Surgeon: Gaynelle Arabian, MD;  Location: [facility];  Service: Orthopedics;  Laterality: Left;  Adductor Block    There were no vitals filed for this visit.      Subjective Assessment - 12/24/16 1007    Subjective I think I can do the exercises at home.  I am going to get a bike to use at home every morning.   Currently in Pain? No/denies                         [facility] Adult PT Treatment/Exercise - 12/24/16 0001      Knee/Hip Exercises: Stretches   Gastroc Stretch Left;3 reps;10 seconds  VC for technique/correct positioning     Knee/Hip Exercises: Aerobic   Nustep L1 x 8 min  Therapist present to discuss treatment     Knee/Hip Exercises: Machines for Strengthening   Total Gym Leg Press Lt only 35# 3x10     Knee/Hip Exercises: Standing   Forward Step Up Both;2 sets;10 reps;Step Height: 6"  Verbal cues to push through leg and to not circumduct     Vasopneumatic   Number Minutes Vasopneumatic  15 minutes   Vasopnuematic Location  Knee   Vasopneumatic Pressure High   Vasopneumatic Temperature  3 snow flakes                  PT Short Term Goals - 12/13/16 1251      PT SHORT TERM GOAL #1   Title independent with inital HEP   Time 4   Period Weeks   Status Achieved     PT SHORT TERM GOAL #2   Title demonstrates increased knee flexion without Lt hip hike during gait   Time 4   Period Weeks   Status Achieved     PT SHORT TERM GOAL #3   Title demonstrates MMT of at least 4/5 Lt hip abduction, extension, and Lt knee flexion and extension.   Time 4   Period Weeks   Status Achieved     PT SHORT TERM GOAL #4   Title reports 25% reduced pain during functional activities.   Time 4  Period Weeks   Status Achieved     PT SHORT TERM GOAL #5   Baseline -12 deg ext; 102 deg flex   Time 4   Period Weeks   Status Partially Met           PT Long Term Goals - 12/17/16 1025      PT LONG TERM GOAL #1   Title independent with advanced HEP   Time 8   Period Weeks   Status Achieved     PT LONG TERM GOAL #2   Title FOTO < or = to 43% limitation   Baseline 47%   Time 8   Period Weeks   Status Not Met     PT LONG TERM GOAL #3   Title improved knee extension to 0 degreee, and flexion to 120 degrees for improved gait   Time 8   Period Weeks   Status Not Met     PT LONG TERM GOAL #4   Title improved 5 x sit to stand to < or = to 12 seconds to demonstrate improved strength and reduced risk of falls   Baseline 11   Time 8   Period Weeks   Status Achieved     PT LONG TERM GOAL #5   Title able to get dressed without assistance due to improved ROM and strength   Time 8   Period Weeks   Status Achieved               Plan - 12/24/16 1008    Clinical  Impression Statement Patient understands that she needs to continue to work on ROM in order to improve her gait.  She is going to have a bike and continue ROM and strengthening exercises at home.  Discharged today with HEP.   Rehab Potential Excellent   Clinical Impairments Affecting Rehab Potential Rt knee pain and OA, s/p Lt total knee replacement   PT Treatment/Interventions ADLs/Self Care Home Management;Biofeedback;Cryotherapy;Electrical Stimulation;Moist Heat;Gait training;Stair training;Functional mobility training;Therapeutic activities;Therapeutic exercise;Balance training;Neuromuscular re-education;Patient/family education;Manual techniques;Vasopneumatic Device;Passive range of motion   PT Next Visit Plan discharged today   Consulted and Agree with Plan of Care Patient      Patient will benefit from skilled therapeutic intervention in order to improve the following deficits and impairments:  Abnormal gait, Decreased activity tolerance, Difficulty walking, Decreased strength, Pain  Visit Diagnosis: Stiffness of left knee, not elsewhere classified  Muscle weakness (generalized)     Problem List Patient Active Problem List   Diagnosis Date Noted  . Anemia 11/01/2008  . Allergic rhinitis 11/01/2008  . Essential hypertension 09/03/2008  . OA (osteoarthritis) of knee 09/03/2008    Zannie Cove, PT 12/24/2016, 10:09 AM  [facility] [address] [place], [place], [address] Phone: [phone]   Fax:  [phone]  Name: Paula Kelley MRN: [id number] Date of Birth: 1951-05-28  PHYSICAL THERAPY DISCHARGE SUMMARY  Visits from Start of Care: 12 Current functional level related to goals / functional outcomes: See above goals   Remaining deficits: See above details   Education / Equipment: HEP Plan: Patient agrees to discharge.  Patient goals were partially met. Patient is being discharged due to the patient's  request.  ?????

## 2017-01-20 ENCOUNTER — Ambulatory Visit: Payer: BC Managed Care – PPO | Admitting: Family Medicine

## 2017-01-21 ENCOUNTER — Encounter: Payer: Self-pay | Admitting: Family Medicine

## 2017-01-21 ENCOUNTER — Ambulatory Visit (INDEPENDENT_AMBULATORY_CARE_PROVIDER_SITE_OTHER): Payer: BC Managed Care – PPO | Admitting: Family Medicine

## 2017-01-21 VITALS — BP 112/80 | HR 100 | Temp 98.3°F | Ht 64.0 in | Wt 171.6 lb

## 2017-01-21 DIAGNOSIS — M1710 Unilateral primary osteoarthritis, unspecified knee: Secondary | ICD-10-CM | POA: Diagnosis not present

## 2017-01-21 DIAGNOSIS — Z6829 Body mass index (BMI) 29.0-29.9, adult: Secondary | ICD-10-CM | POA: Diagnosis not present

## 2017-01-21 DIAGNOSIS — I10 Essential (primary) hypertension: Secondary | ICD-10-CM

## 2017-01-21 DIAGNOSIS — M179 Osteoarthritis of knee, unspecified: Secondary | ICD-10-CM

## 2017-01-21 NOTE — Patient Instructions (Signed)
BEFORE YOU LEAVE: -follow up: in 3-4 months, will plan to do labs then   We recommend the following healthy lifestyle for LIFE: 1) Small portions.   Tip: eat off of a salad plate instead of a dinner plate.  Tip: It is ok to feel hungry after a meal of proper portion sizes  Tip: if you need more or a snack choose fruits, veggies and/or a handful of nuts or seeds.  2) Eat a healthy clean diet.  * Tip: Avoid (less then 1 serving per week): processed foods, sweets, sweetened drinks, white starches (rice, flour, bread, potatoes, pasta, etc), red meat, fast foods, butter  *Tip: CHOOSE instead   * 5-9 servings per day of fresh or frozen fruits and vegetables (but not corn, potatoes, bananas, canned or dried fruit)   *nuts and seeds, beans   *olives and olive oil   *small portions of lean meats such as fish and white chicken    *small portions of whole grains  3)Get at least 150 minutes of sweaty aerobic exercise per week.  4)Reduce stress - consider counseling, meditation and relaxation to balance other aspects of your life.

## 2017-01-21 NOTE — Progress Notes (Signed)
Pre visit review using our clinic review tool, if applicable. No additional management support is needed unless otherwise documented below in the visit note. 

## 2017-01-21 NOTE — Progress Notes (Signed)
HPI:  Paula Kelley is a pleasant 66 y.o. here for follow up. Chronic medical problems summarized below were reviewed for changes and stability and were updated as needed below. These issues and their treatment remain stable for the most part. Recently underwent L TKA with Dr. Berton Lan. Doing well. Has mostly recovered from her knee surgery. Would prefer to skip labs today. Trying to eat healthy. Wt down a little for little 175--> 171. Denies CP, SOB, DOE, treatment intolerance or new symptoms.  HTN: -meds: amlodipine-benazepril 5-10mg   Obesity: -she is walking 1 hour 3 days per week - but not as good -she has a fit bit - trying to do 10000 per day  Iron def anemia: -reports she has had this her whole life and takes iron  Allergic Rhinitis: -chronic PND, sinus issues -hx allergies and had allergy shots in the past   ROS: See pertinent positives and negatives per HPI.  Past Medical History:  Diagnosis Date  . Allergic rhinitis   . Anxiety   . DJD (degenerative joint disease)    Sees Dr. Dierdre Forth  . Headache(784.0)   . Hypertension   . Mild anemia    iron def, reports on iron her whole life  . Obesity   . URI (upper respiratory infection)   . UTI (lower urinary tract infection)     Past Surgical History:  Procedure Laterality Date  . COLONOSCOPY    . svd     2 live births  . TOTAL KNEE ARTHROPLASTY Left 11/01/2016   Procedure: LEFT TOTAL KNEE ARTHROPLASTY;  Surgeon: Ollen Gross, MD;  Location: [facility];  Service: Orthopedics;  Laterality: Left;  Adductor Block    Family History  Problem Relation Age of Onset  . Heart disease Father   . Heart disease Mother   . Colon cancer Neg Hx     Social History   Social History  . Marital status: Married    Spouse name: N/A  . Number of children: N/A  . Years of education: N/A   Occupational History  . retired    Social History Main Topics  . Smoking status: Never Smoker  . Smokeless tobacco: Never Used  .  Alcohol use Yes     Comment: social use  . Drug use: No  . Sexual activity: Not Asked   Other Topics Concern  . None   Social History Narrative   Work or School: works at [employer] and T at [employer] Situation: lives with husband      Spiritual Beliefs: methodist      Lifestyle: walks; trying to eat a healthy diet              Current Outpatient Prescriptions:  .  amLODipine-benazepril (LOTREL) 5-10 MG capsule, TAKE 1 CAPSULE BY MOUTH DAILY, Disp: 90 capsule, Rfl: 1 .  Ascorbic Acid (VITAMIN C PO), Take by mouth., Disp: , Rfl:  .  aspirin 81 MG EC tablet, Take 81 mg by mouth daily. Swallow whole., Disp: , Rfl:  .  Cholecalciferol (VITAMIN D PO), Take by mouth., Disp: , Rfl:  .  ferrous sulfate 325 (65 FE) MG tablet, Take 325 mg by mouth daily with breakfast., Disp: , Rfl:  .  fluticasone (FLONASE) 50 MCG/ACT nasal spray, Place 2 sprays into both nostrils daily. (Patient taking differently: Place 2 sprays into both nostrils daily as needed (for allergies/sinus issues). ), Disp: 16 g, Rfl: 6 .  guaiFENesin (MUCINEX) 600 MG 12 hr  tablet, TAKE 1-2 TABLETS BY MOUTH TWICE DAILY AS NEEDED (Patient taking differently: Take 600-1,200 mg by mouth 2 (two) times daily as needed (for allergies/sinus issues). TAKE 1-2 TABLETS BY MOUTH TWICE DAILY AS NEEDED), Disp: 120 tablet, Rfl: 1 .  levocetirizine (XYZAL) 2.5 MG/5ML solution, Take 5 mLs (2.5 mg total) by mouth every evening. (Patient taking differently: Take 2.5 mg by mouth daily as needed (for allergies/sinus issues). ), Disp: 148 mL, Rfl: 12 .  psyllium (METAMUCIL) 58.6 % powder, Take 1 packet by mouth daily at 2 PM. , Disp: , Rfl:   EXAM:  Vitals:   01/21/17 1549  BP: 112/80  Pulse: 100  Temp: 98.3 F (36.8 C)    Body mass index is 29.46 kg/m.  GENERAL: vitals reviewed and listed above, alert, oriented, appears well hydrated and in no acute distress  HEENT: atraumatic, conjunttiva clear, no obvious abnormalities on inspection  of external nose and ears  NECK: no obvious masses on inspection  LUNGS: clear to auscultation bilaterally, no wheezes, rales or rhonchi, good air movement  CV: HRRR, no peripheral edema  MS: moves all extremities without noticeable abnormality  PSYCH: pleasant and cooperative, no obvious depression or anxiety  ASSESSMENT AND PLAN:  Discussed the following assessment and plan:  Essential hypertension  BMI 29.0-29.9,adult  Osteoarthritis of knee, unspecified laterality, unspecified osteoarthritis type  -continue current labs -healthy diet and regular exercise despite -labs next visit -follow up 3-4 months -Patient advised to return or notify a doctor immediately if symptoms worsen or persist or new concerns arise.  Patient Instructions  BEFORE YOU LEAVE: -follow up: in 3-4 months, will plan to do labs then   We recommend the following healthy lifestyle for LIFE: 1) Small portions.   Tip: eat off of a salad plate instead of a dinner plate.  Tip: It is ok to feel hungry after a meal of proper portion sizes  Tip: if you need more or a snack choose fruits, veggies and/or a handful of nuts or seeds.  2) Eat a healthy clean diet.  * Tip: Avoid (less then 1 serving per week): processed foods, sweets, sweetened drinks, white starches (rice, flour, bread, potatoes, pasta, etc), red meat, fast foods, butter  *Tip: CHOOSE instead   * 5-9 servings per day of fresh or frozen fruits and vegetables (but not corn, potatoes, bananas, canned or dried fruit)   *nuts and seeds, beans   *olives and olive oil   *small portions of lean meats such as fish and white chicken    *small portions of whole grains  3)Get at least 150 minutes of sweaty aerobic exercise per week.  4)Reduce stress - consider counseling, meditation and relaxation to balance other aspects of your life.     Kriste Basque R., DO

## 2017-02-11 ENCOUNTER — Other Ambulatory Visit: Payer: Self-pay | Admitting: Family Medicine

## 2017-05-25 NOTE — Progress Notes (Signed)
HPI:  Paula Kelley is a pleasant 66 y.o. here for follow up. Chronic medical problems summarized below were reviewed for changes. She is doing well other than some right knee pain. She has a right knee replacement planned for next year. She did well with her left knee replacement last January. She is doing some exercises on a recumbent bike for 30 minutes several days a week and tolerates this well. Her blood pressure is a little elevated today, but she prefers to make some changes in her diet rather than changing her medications. Denies CP, SOB, DOE, treatment intolerance or new symptoms. Due for: flu shot, hep c, labs: cbc, bmp  HTN: -meds: amlodipine-benazepril 5-10mg   Obesity: -she is walking 1 hour 3 days per week - but not as good -she has a fit bit - trying to do 10000 per day  Iron def anemia: -reports she has had this her whole life and takesiron  Allergic Rhinitis: -chronic PND, sinus issues -hx allergies and had allergy shots in the past  ROS: See pertinent positives and negatives per HPI.  Past Medical History:  Diagnosis Date  . Allergic rhinitis   . Anxiety   . DJD (degenerative joint disease)    Sees Dr. Dierdre Forth  . Headache(784.0)   . Hypertension   . Mild anemia    iron def, reports on iron her whole life  . Obesity   . URI (upper respiratory infection)   . UTI (lower urinary tract infection)     Past Surgical History:  Procedure Laterality Date  . COLONOSCOPY    . svd     2 live births  . TOTAL KNEE ARTHROPLASTY Left 11/01/2016   Procedure: LEFT TOTAL KNEE ARTHROPLASTY;  Surgeon: Ollen Gross, MD;  Location: [facility];  Service: Orthopedics;  Laterality: Left;  Adductor Block    Family History  Problem Relation Age of Onset  . Heart disease Father   . Heart disease Mother   . Colon cancer Neg Hx     Social History   Social History  . Marital status: Married    Spouse name: N/A  . Number of children: N/A  . Years of education: N/A    Occupational History  . retired    Social History Main Topics  . Smoking status: Never Smoker  . Smokeless tobacco: Never Used  . Alcohol use Yes     Comment: social use  . Drug use: No  . Sexual activity: Not Asked   Other Topics Concern  . None   Social History Narrative   Work or School: works at [employer] and T at [employer] Situation: lives with husband      Spiritual Beliefs: methodist      Lifestyle: walks; trying to eat a healthy diet              Current Outpatient Prescriptions:  .  amLODipine-benazepril (LOTREL) 5-10 MG capsule, TAKE 1 CAPSULE BY MOUTH DAILY, Disp: 90 capsule, Rfl: 1 .  Ascorbic Acid (VITAMIN C PO), Take by mouth., Disp: , Rfl:  .  aspirin 81 MG EC tablet, Take 81 mg by mouth daily. Swallow whole., Disp: , Rfl:  .  Cholecalciferol (VITAMIN D PO), Take by mouth., Disp: , Rfl:  .  ferrous sulfate 325 (65 FE) MG tablet, Take 325 mg by mouth daily with breakfast., Disp: , Rfl:  .  fluticasone (FLONASE) 50 MCG/ACT nasal spray, Place 2 sprays into both nostrils daily. (Patient taking  differently: Place 2 sprays into both nostrils daily as needed (for allergies/sinus issues). ), Disp: 16 g, Rfl: 6 .  guaiFENesin (MUCINEX) 600 MG 12 hr tablet, TAKE 1-2 TABLETS BY MOUTH TWICE DAILY AS NEEDED (Patient taking differently: Take 600-1,200 mg by mouth 2 (two) times daily as needed (for allergies/sinus issues). TAKE 1-2 TABLETS BY MOUTH TWICE DAILY AS NEEDED), Disp: 120 tablet, Rfl: 1 .  levocetirizine (XYZAL) 2.5 MG/5ML solution, Take 5 mLs (2.5 mg total) by mouth every evening. (Patient taking differently: Take 2.5 mg by mouth daily as needed (for allergies/sinus issues). ), Disp: 148 mL, Rfl: 12 .  psyllium (METAMUCIL) 58.6 % powder, Take 1 packet by mouth daily at 2 PM. , Disp: , Rfl:   EXAM:  Vitals:   05/26/17 1633  BP: 140/90  Pulse: 74  Temp: 98 F (36.7 C)    Body mass index is 30.23 kg/m.  GENERAL: vitals reviewed and listed above, alert,  oriented, appears well hydrated and in no acute distress  HEENT: atraumatic, conjunttiva clear, no obvious abnormalities on inspection of external nose and ears  NECK: no obvious masses on inspection  LUNGS: clear to auscultation bilaterally, no wheezes, rales or rhonchi, good air movement  CV: HRRR, no peripheral edema  MS: moves all extremities without noticeable abnormality  PSYCH: pleasant and cooperative, no obvious depression or anxiety  ASSESSMENT AND PLAN:  Discussed the following assessment and plan:  Essential hypertension - Plan: Basic metabolic panel, CBC  BMI 30.0-30.9,adult - Plan: Lipid panel  Osteoarthritis of knee, unspecified laterality, unspecified osteoarthritis type  Encounter for HCV screening test for high risk patient - Plan: Hepatitis C antibody  -Labs -Lifestyle recommendations -She fasted all day, so we will go ahead and check her lipid panel today -We discussed her elevated blood pressure, she would prefer to try diet changes for this rather than change her blood pressure medicine - we'll have her follow up in 3 months to recheck -Patient advised to return or notify a doctor immediately if symptoms worsen or persist or new concerns arise.  Patient Instructions  BEFORE YOU LEAVE: -follow up: 3 months -Labs  We have ordered labs or studies at this visit. It can take up to 1-2 weeks for results and processing. IF results require follow up or explanation, we will call you with instructions. Clinically stable results will be released to your [facility]. If you have not heard from us or cannot find your results in [facility] in 2 weeks please contact our office at [phone].  If you are not yet signed up for [facility], please consider signing up.   We recommend the following healthy lifestyle for LIFE: 1) Small portions.   Tip: eat off of a salad plate instead of a dinner plate.  Tip: It is ok to feel hungry after a meal - that likely means you ate an  appropriate portion.  Tip: if you need more or a snack choose fruits, veggies and/or a handful of nuts or seeds.  2) Eat a healthy clean diet.   TRY TO EAT: -at least 5-7 servings of low sugar vegetables per day (not corn, potatoes or bananas.) -berries are the best choice if you wish to eat fruit.   -lean meets (fish, chicken or Mal[place]key breasts) -vegan proteins for some meals - beans or tofu, whole grains, nuts and seeds -Replace bad fats with good fats - good fats include: fish, nuts and seeds, canola oil, olive oil -small amounts of low fat or non fat  dairy -small amounts of100 % whole grains - check the lables  AVOID: -SUGAR, sweets, anything with added sugar, corn syrup or sweeteners -if you must have a sweetener, small amounts of stevia may be best -sweetened beverages -simple starches (rice, bread, potatoes, pasta, chips, etc - small amounts of 100% whole grains are ok) -red meat, pork, butter -fried foods, fast food, processed food, excessive dairy, eggs and coconut.  3)Get at least 150 minutes of sweaty aerobic exercise per week.  4)Reduce stress - consider counseling, meditation and relaxation to balance other aspects of your life.   WE NOW OFFER   [facility] FAST TRACK!!!  SAME DAY Appointments for ACUTE CARE  Such as: Sprains, Injuries, cuts, abrasions, rashes, muscle pain, joint pain, back pain Colds, flu, sore throats, headache, allergies, cough, fever  Ear pain, sinus and eye infections Abdominal pain, nausea, vomiting, diarrhea, upset stomach Animal/insect bites  3 Easy Ways to Schedule: Walk-In Scheduling Call in scheduling Mychart Sign-up: [email or web address]             Kriste Basque R., DO

## 2017-05-26 ENCOUNTER — Encounter: Payer: Self-pay | Admitting: Family Medicine

## 2017-05-26 ENCOUNTER — Ambulatory Visit (INDEPENDENT_AMBULATORY_CARE_PROVIDER_SITE_OTHER): Payer: BC Managed Care – PPO | Admitting: Family Medicine

## 2017-05-26 VITALS — BP 140/90 | HR 74 | Temp 98.0°F | Ht 64.0 in | Wt 176.1 lb

## 2017-05-26 DIAGNOSIS — M1710 Unilateral primary osteoarthritis, unspecified knee: Secondary | ICD-10-CM | POA: Diagnosis not present

## 2017-05-26 DIAGNOSIS — Z9189 Other specified personal risk factors, not elsewhere classified: Secondary | ICD-10-CM

## 2017-05-26 DIAGNOSIS — Z6830 Body mass index (BMI) 30.0-30.9, adult: Secondary | ICD-10-CM | POA: Diagnosis not present

## 2017-05-26 DIAGNOSIS — I10 Essential (primary) hypertension: Secondary | ICD-10-CM

## 2017-05-26 DIAGNOSIS — M179 Osteoarthritis of knee, unspecified: Secondary | ICD-10-CM

## 2017-05-26 DIAGNOSIS — Z1159 Encounter for screening for other viral diseases: Secondary | ICD-10-CM | POA: Diagnosis not present

## 2017-05-26 NOTE — Patient Instructions (Signed)
BEFORE YOU LEAVE: -follow up: 3 months -Labs  We have ordered labs or studies at this visit. It can take up to 1-2 weeks for results and processing. IF results require follow up or explanation, we will call you with instructions. Clinically stable results will be released to your [facility]. If you have not heard from us or cannot find your results in [facility] in 2 weeks please contact our office at [phone].  If you are not yet signed up for [facility], please consider signing up.   We recommend the following healthy lifestyle for LIFE: 1) Small portions.   Tip: eat off of a salad plate instead of a dinner plate.  Tip: It is ok to feel hungry after a meal - that likely means you ate an appropriate portion.  Tip: if you need more or a snack choose fruits, veggies and/or a handful of nuts or seeds.  2) Eat a healthy clean diet.   TRY TO EAT: -at least 5-7 servings of low sugar vegetables per day (not corn, potatoes or bananas.) -berries are the best choice if you wish to eat fruit.   -lean meets (fish, chicken or [place] breasts) -vegan proteins for some meals - beans or tofu, whole grains, nuts and seeds -Replace bad fats with good fats - good fats include: fish, nuts and seeds, canola oil, olive oil -small amounts of low fat or non fat dairy -small amounts of100 % whole grains - check the lables  AVOID: -SUGAR, sweets, anything with added sugar, corn syrup or sweeteners -if you must have a sweetener, small amounts of stevia may be best -sweetened beverages -simple starches (rice, bread, potatoes, pasta, chips, etc - small amounts of 100% whole grains are ok) -red meat, pork, butter -fried foods, fast food, processed food, excessive dairy, eggs and coconut.  3)Get at least 150 minutes of sweaty aerobic exercise per week.  4)Reduce stress - consider counseling, meditation and relaxation to balance other aspects of your life.   WE NOW OFFER   [facility] FAST  TRACK!!!  SAME DAY Appointments for ACUTE CARE  Such as: Sprains, Injuries, cuts, abrasions, rashes, muscle pain, joint pain, back pain Colds, flu, sore throats, headache, allergies, cough, fever  Ear pain, sinus and eye infections Abdominal pain, nausea, vomiting, diarrhea, upset stomach Animal/insect bites  3 Easy Ways to Schedule: Walk-In Scheduling Call in scheduling Mychart Sign-up: [email or web address]

## 2017-05-27 LAB — CBC
HCT: 38 % (ref 36.0–46.0)
Hemoglobin: 11.9 g/dL — ABNORMAL LOW (ref 12.0–15.0)
MCHC: 31.2 g/dL (ref 30.0–36.0)
MCV: 89.9 fl (ref 78.0–100.0)
PLATELETS: 309 10*3/uL (ref 150.0–400.0)
RBC: 4.23 Mil/uL (ref 3.87–5.11)
RDW: 14 % (ref 11.5–15.5)
WBC: 6.1 10*3/uL (ref 4.0–10.5)

## 2017-05-27 LAB — BASIC METABOLIC PANEL
BUN: 11 mg/dL (ref 6–23)
CALCIUM: 9.9 mg/dL (ref 8.4–10.5)
CO2: 31 meq/L (ref 19–32)
Chloride: 102 mEq/L (ref 96–112)
Creatinine, Ser: 0.73 mg/dL (ref 0.40–1.20)
GFR: 102.64 mL/min (ref >60.00)
Glucose, Bld: 86 mg/dL (ref 70–99)
Potassium: 3.5 mEq/L (ref 3.5–5.1)
SODIUM: 140 meq/L (ref 135–145)

## 2017-05-27 LAB — LIPID PANEL
CHOLESTEROL: 171 mg/dL (ref 0–200)
HDL: 69.6 mg/dL (ref >39.00)
LDL CALC: 92 mg/dL (ref 0–99)
NonHDL: 101.22
TRIGLYCERIDES: 45 mg/dL (ref 0.0–149.0)
Total CHOL/HDL Ratio: 2
VLDL: 9 mg/dL (ref 0.0–40.0)

## 2017-05-27 LAB — HEPATITIS C ANTIBODY: HCV Ab: NONREACTIVE

## 2017-07-07 ENCOUNTER — Encounter: Payer: Self-pay | Admitting: Family Medicine

## 2017-08-08 ENCOUNTER — Other Ambulatory Visit: Payer: Self-pay | Admitting: Family Medicine

## 2017-08-25 ENCOUNTER — Ambulatory Visit: Payer: BC Managed Care – PPO | Admitting: Family Medicine

## 2017-09-01 ENCOUNTER — Ambulatory Visit (INDEPENDENT_AMBULATORY_CARE_PROVIDER_SITE_OTHER): Payer: BC Managed Care – PPO | Admitting: Family Medicine

## 2017-09-01 ENCOUNTER — Encounter: Payer: Self-pay | Admitting: Family Medicine

## 2017-09-01 VITALS — BP 140/92 | HR 80 | Temp 98.1°F | Ht 64.0 in | Wt 178.0 lb

## 2017-09-01 DIAGNOSIS — Z01818 Encounter for other preprocedural examination: Secondary | ICD-10-CM

## 2017-09-01 DIAGNOSIS — M25569 Pain in unspecified knee: Secondary | ICD-10-CM | POA: Diagnosis not present

## 2017-09-01 DIAGNOSIS — Z6830 Body mass index (BMI) 30.0-30.9, adult: Secondary | ICD-10-CM

## 2017-09-01 DIAGNOSIS — I10 Essential (primary) hypertension: Secondary | ICD-10-CM

## 2017-09-01 DIAGNOSIS — G8929 Other chronic pain: Secondary | ICD-10-CM

## 2017-09-01 MED ORDER — LEVOCETIRIZINE DIHYDROCHLORIDE 2.5 MG/5ML PO SOLN: 2.5000 mg | Freq: Every day | ORAL | 3 refills | Status: DC | PRN

## 2017-09-01 MED ORDER — AMLODIPINE BESY-BENAZEPRIL HCL 5-20 MG PO CAPS: 1.0000 | ORAL_CAPSULE | Freq: Every day | ORAL | 3 refills | Status: DC

## 2017-09-01 MED ORDER — FLUTICASONE PROPIONATE 50 MCG/ACT NA SUSP: 2.0000 | Freq: Every day | NASAL | 3 refills | Status: DC | PRN

## 2017-09-01 NOTE — Progress Notes (Signed)
Patient is seen for optimization of general medical care prior to surgery. Surgery type:  R TKA Date of surgery:  10/31/17  Kidney disease? No Prior surgeries/Issues following anesthesia? colonosocpy, knee surgery last year -  no issues with anesthesia Hx MI, heart arrythmia, CHF, angina or stroke? none Epilepsy or Seizures? none Arthritis or problems with neck or jaw? none Thyroid disease? none Liver disease? none Asthma, COPD or chronic lung disease? none Diabetes? none (Needs to be evaluated by anesthesia if yes to these questions.)  Other: Poor nutrition, Frail or other: no  METS:  ?Can take care of self, such as eat, dress, or use the toilet (1 MET). yes ?Can walk up a flight of steps or a hill (4 METs).yes ?Can do heavy work around the house such as scrubbing floors or lifting or moving heavy furniture (between 4 and 10 METs). Yes  ?Can participate in strenuous sports such as swimming, singles tennis, football, basketball, and skiing: no due to knee pain . AHA Risks: Major predictors that require intensive management and may lead to delay in or cancellation of the operative procedure unless emergent: NONE  . Unstable coronary syndromes including unstable or severe angina or recent MI  . Decompensated heart failure including NYHA functional class IV or worsening or new-onset HF  . Significant arrhythmias including high grade AV block, symptomatic ventricular arrhythmias, supraventricular arrhythmias with ventricular rate >100 bpm at rest, symptomatic bradycardia, and newly recognized ventricular tachycardia  . Severe heart valve disease including severe aortic stenosis or symptomatic mitral stenosis   Other clinical predictors that warrant careful assessment of current status: NONE  . History of ischemic heart disease . History of cerebrovascular disease  . History of compensated heart failure or prior heart failure  . Diabetes mellitus  . Renal insufficiency  Type of  surgery and Risk: 1) High risk (reported risk of cardiac death or nonfatal myocardial infarction [MI] often greater than 5 percent):  [name] Aortic and other major vascular surgery  . Peripheral artery surgery   2)Intermediate risk (reported risk of cardiac death or nonfatal MI generally 1 to 5 percent):  [name] Carotid endarterectomy  . Head and neck surgery  . Intraperitoneal and intrathoracic surgery  . Orthopedic surgery  . Prostate surgery   3)Low risk (reported risk of cardiac death or nonfatal MI generally less than 1 percent):  [name] Ambulatory surgery  . Endoscopic procedures  . Superficial procedure  . Cataract surgery  . Breast surgery  Medications that need to be addressed prior to surgery: None Discontinue acei/arbs/non-statin lipid lowering drugs day of surgery ASA stop 7 days before or discuss with cardiology if CV risks, other anticoagulants discuss with cardiology.  ROS: See pertinent positives and negatives per HPI. 11 point ROS negative except where noted.      Past Medical History:  Diagnosis Date  . Allergic rhinitis   . Anxiety   . DJD (degenerative joint disease)    Sees Dr. Amil Amen  . Headache(784.0)   . Hypertension   . Mild anemia    iron def, reports on iron her whole life  . Obesity   . URI (upper respiratory infection)   . UTI (lower urinary tract infection)          Past Surgical History:  Procedure Laterality Date  . svd     2 live births         Family History  Problem Relation Age of Onset  . Heart disease Father   . Heart  disease Mother   . Colon cancer Neg Hx     Social History        Social History  . Marital status: Married    Spouse name: N/A  . Number of children: N/A  . Years of education: N/A       Occupational History  . retired          Social History Main Topics  . Smoking status: Never Smoker  . Smokeless tobacco: Never Used  . Alcohol use Yes     Comment: social use  . Drug  use: No  . Sexual activity: Not Asked       Other Topics Concern  . None      Social History Narrative   Work or School: works at [employer] and T at [employer] Situation: lives with husband      Spiritual Beliefs: methodist      Lifestyle: walks; trying to eat a healthy diet              Current Outpatient Prescriptions:  .  amLODipine-benazepril (LOTREL) 5-10 MG capsule, TAKE 1 CAPSULE BY MOUTH DAILY, Disp: 90 capsule, Rfl: 1 .  aspirin EC 81 MG tablet, Take 81 mg by mouth daily., Disp: , Rfl:  .  calcium carbonate (OS-CAL) 600 MG TABS, Take 600 mg by mouth daily., Disp: , Rfl:  .  cholecalciferol (VITAMIN D) 1000 UNITS tablet, Take 1,000 Units by mouth daily., Disp: , Rfl:  .  ferrous sulfate 325 (65 FE) MG tablet, Take 325 mg by mouth daily with breakfast., Disp: , Rfl:  .  fish oil-omega-3 fatty acids 1000 MG capsule, Take 2 g by mouth daily., Disp: , Rfl:  .  fluticasone (FLONASE) 50 MCG/ACT nasal spray, Place 2 sprays into both nostrils daily. (Patient taking differently: Place 2 sprays into both nostrils daily as needed (for allergies/sinus issues). ), Disp: 16 g, Rfl: 6 .  guaiFENesin (MUCINEX) 600 MG 12 hr tablet, TAKE 1-2 TABLETS BY MOUTH TWICE DAILY AS NEEDED (Patient taking differently: Take 600-1,200 mg by mouth 2 (two) times daily as needed (for allergies/sinus issues). TAKE 1-2 TABLETS BY MOUTH TWICE DAILY AS NEEDED), Disp: 120 tablet, Rfl: 1 .  ibuprofen (ADVIL,MOTRIN) 200 MG tablet, Take 800 mg by mouth every 8 (eight) hours as needed (for pain.)., Disp: , Rfl:  .  levocetirizine (XYZAL) 2.5 MG/5ML solution, Take 5 mLs (2.5 mg total) by mouth every evening. (Patient taking differently: Take 2.5 mg by mouth daily as needed (for allergies/sinus issues). ), Disp: 148 mL, Rfl: 12 .  Multiple Vitamins-Minerals (CENTRUM SILVER ULTRA WOMENS) TABS, TAKE 1 TABLET BY MOUTH EVERY DAY, Disp: 200 tablet, Rfl: 0 .  psyllium (METAMUCIL) 58.6 % powder, Take 1  packet by mouth daily at 2 PM. , Disp: , Rfl:   EXAM:  Vitals:   09/01/17 1647  BP: (!) 140/92  Pulse: 80  Temp: 98.1 F (36.7 C)   Body mass index is 30.55 kg/m.  There is no height or weight on file to calculate BMI.  GENERAL: vitals reviewed and listed above, alert, oriented, appears well hydrated and in no acute distress  HEENT: atraumatic, conjunttiva clear, no obvious abnormalities on inspection of external nose and ears  NECK: no obvious masses on inspection, no carotid bruits  LUNGS: clear to auscultation bilaterally, no wheezes, rales or rhonchi, good air movement  CV: HRRR, no peripheral edema, no JVD, BP normal range, normal radial pulses  MS:  moves all extremities without noticeable abnormality  PSYCH: pleasant and cooperative, no obvious depression or anxiety  ASSESSMENT AND PLAN:  Discussed the following assessment and plan: More than 50% of over 30  minutes spent in total in caring for this patient was spent face-to-face with the patient, counseling and/or coordinating care.    Preop examination Hypertension Obesity Knee pain  Assessment: -Risk factors: none -Surgery Risks:intermediate -age, nutritional status, fraility: good nutritional status, age +85, no fraility -functional capacity: > 4 METs wethout symptoms -comorbidities: none Patient Specific Risks: patient is low risk for intermediate risks surgery  PLAN: -advised to increase BP medication - new dose sent -advise healthy diet and regular exercise/core/chair exercises, etc  Recommendations for optimizing general medical care prior to surgery: -advised patient to discuss specific risks morbidity and mortality of surgery with surgeon, CV risks discussed with patient -advised patient will defer to surgeon for post-op DVT prophylaxis and post op care -no specific medical recommendations for this patient at this time and no recommendations to defer surgery or for further CV  testing prior to surgery -advise follow surgery recommendations regarding all medications, advise stopping aspirin, fish oil, supplements and ibuprofen 1 week before  Surgery; hold lotrel the day of surgery -form for pre-op optimization of general medical care prior to surgery will be faxed to surgeon office

## 2017-09-01 NOTE — Patient Instructions (Addendum)
BEFORE YOU LEAVE: -follow up: 1 month  PLAN: -increase BP medication - new dose sent -advise healthy diet and regular exercise/core/chair exercises, etc  Recommendations for optimizing general medical care prior to surgery: - discuss specific risks morbidity and mortality of surgery with surgeon - will defer to surgeon for post-op DVT prophylaxis and post op care -advise following surgery recommendations regarding all medications, advise stopping aspirin, fish oil, supplements and ibuprofen 1 week before  Surgery; hold lotrel the day of surgery -form for pre-op optimization of general medical care prior to surgery will be faxed to surgeon office   We recommend the following healthy lifestyle for LIFE: 1) Small portions. But, make sure to get regular (at least 3 per day), healthy meals and small healthy snacks if needed.  2) Eat a healthy clean diet.   TRY TO EAT: -at least 5-7 servings of low sugar, colorful, and nutrient rich vegetables per day (not corn, potatoes or bananas.) -berries are the best choice if you wish to eat fruit (only eat small amounts if trying to reduce weight)  -lean meets (fish, white meat of chicken or [place]) -vegan proteins for some meals - beans or tofu, whole grains, nuts and seeds -Replace bad fats with good fats - good fats include: fish, nuts and seeds, canola oil, olive oil -small amounts of low fat or non fat dairy -small amounts of100 % whole grains - check the lables  AVOID: -SUGAR, sweets, anything with added sugar, corn syrup or sweeteners -if you must have a sweetener, small amounts of stevia may be best -sweetened beverages -simple starches (rice, bread, potatoes, pasta, chips, etc - small amounts of 100% whole grains are ok) -red meat, pork, butter -fried foods, fast food, processed food, excessive dairy, eggs and coconut.  3)Get at least 150 minutes of sweaty aerobic exercise per week.  4)Reduce stress - consider counseling, meditation  and relaxation to balance other aspects of your life.

## 2017-09-01 NOTE — Addendum Note (Signed)
Addended by: Johnella MoloneyFUNDERBURK, Uchenna Rappaport A on: 09/01/2017 05:25 PM   Modules accepted: Orders

## 2017-09-25 ENCOUNTER — Ambulatory Visit: Payer: Self-pay | Admitting: Orthopedic Surgery

## 2017-10-03 NOTE — Progress Notes (Signed)
HPI:  Follow up HTN: -increased bp med and advised lifestyle changes last visit -reports: doing great -denies:CP, SOB, doe, swelling    ROS: See pertinent positives and negatives per HPI.  Past Medical History:  Diagnosis Date  . Allergic rhinitis   . Anxiety   . DJD (degenerative joint disease)    Sees Dr. Dierdre ForthBeekman  . Headache(784.0)   . Hypertension   . Mild anemia    iron def, reports on iron her whole life  . Obesity   . URI (upper respiratory infection)   . UTI (lower urinary tract infection)     Past Surgical History:  Procedure Laterality Date  . COLONOSCOPY    . svd     2 live births  . TOTAL KNEE ARTHROPLASTY Left 11/01/2016   Procedure: LEFT TOTAL KNEE ARTHROPLASTY;  Surgeon: Ollen GrossFrank Aluisio, MD;  Location: [facility];  Service: Orthopedics;  Laterality: Left;  Adductor Block    Family History  Problem Relation Age of Onset  . Heart disease Father   . Heart disease Mother   . Colon cancer Neg Hx     Social History   Socioeconomic History  . Marital status: Married    Spouse name: None  . Number of children: None  . Years of education: None  . Highest education level: None  Social Needs  . Financial resource strain: None  . Food insecurity - worry: None  . Food insecurity - inability: None  . Transportation needs - medical: None  . Transportation needs - non-medical: None  Occupational History  . Occupation: retired  Tobacco Use  . Smoking status: Never Smoker  . Smokeless tobacco: Never Used  Substance and Sexual Activity  . Alcohol use: Yes    Comment: social use  . Drug use: No  . Sexual activity: None  Other Topics Concern  . None  Social History Narrative   Work or School: works at [employer] and T at [employer]      Home Situation: lives with husband      Spiritual Beliefs: methodist      Lifestyle: walks; trying to eat a healthy diet              Current Outpatient Medications:  .  amLODipine-benazepril (LOTREL) 5-20 MG capsule, Take 1  capsule daily by mouth., Disp: 30 capsule, Rfl: 3 .  Ascorbic Acid (VITAMIN C PO), Take by mouth., Disp: , Rfl:  .  aspirin 81 MG EC tablet, Take 81 mg by mouth daily. Swallow whole., Disp: , Rfl:  .  Cholecalciferol (VITAMIN D PO), Take by mouth., Disp: , Rfl:  .  ferrous sulfate 325 (65 FE) MG tablet, Take 325 mg by mouth daily with breakfast., Disp: , Rfl:  .  fluticasone (FLONASE) 50 MCG/ACT nasal spray, Place 2 sprays daily as needed into both nostrils (for allergies/sinus issues)., Disp: 16 g, Rfl: 3 .  guaiFENesin (MUCINEX) 600 MG 12 hr tablet, TAKE 1-2 TABLETS BY MOUTH TWICE DAILY AS NEEDED (Patient taking differently: Take 600-1,200 mg by mouth 2 (two) times daily as needed (for allergies/sinus issues). TAKE 1-2 TABLETS BY MOUTH TWICE DAILY AS NEEDED), Disp: 120 tablet, Rfl: 1 .  levocetirizine (XYZAL) 2.5 MG/5ML solution, Take 5 mLs (2.5 mg total) daily as needed by mouth (for allergies/sinus issues)., Disp: 148 mL, Rfl: 3 .  psyllium (METAMUCIL) 58.6 % powder, Take 1 packet by mouth daily at 2 PM. , Disp: , Rfl:   EXAM:  Vitals:   10/04/17 1419  BP:  120/70  Pulse: 81  Temp: 98 F (36.7 C)    Body mass index is 30.55 kg/m.  GENERAL: vitals reviewed and listed above, alert, oriented, appears well hydrated and in no acute distress  HEENT: atraumatic, conjunttiva clear, no obvious abnormalities on inspection of external nose and ears  NECK: no obvious masses on inspection  LUNGS: clear to auscultation bilaterally, no wheezes, rales or rhonchi, good air movement  CV: HRRR, no peripheral edema  MS: moves all extremities without noticeable abnormality  PSYCH: pleasant and cooperative, no obvious depression or anxiety  ASSESSMENT AND PLAN:  Discussed the following assessment and plan:  Essential hypertension  -Blood pressure looks much better -Lifestyle Recs, advised low sugar diet, sugar is an issue in her diet -cont current dose BP med, advised to ask pharmacy about  recall questions -pneumococcal vaccine offered, risks/benefits discussed, she opted to do today -Patient advised to return or notify a doctor immediately if symptoms worsen or persist or new concerns arise.  Patient Instructions  BEFORE YOU LEAVE: -pneumococcal 23 -labs -follow up: 3-4 months, CPE  Check with pharmacy about your blood pressure medication  We recommend the following healthy lifestyle for LIFE: 1) Small portions. But, make sure to get regular (at least 3 per day), healthy meals and small healthy snacks if needed.  2) Eat a healthy clean diet.   TRY TO EAT: -at least 5-7 servings of low sugar, colorful, and nutrient rich vegetables per day (not corn, potatoes or bananas.) -berries are the best choice if you wish to eat fruit (only eat small amounts if trying to reduce weight)  -lean meets (fish, white meat of chicken or [place]) -vegan proteins for some meals - beans or tofu, whole grains, nuts and seeds -Replace bad fats with good fats - good fats include: fish, nuts and seeds, canola oil, olive oil -small amounts of low fat or non fat dairy -small amounts of100 % whole grains - check the lables -drink plenty of water  AVOID: -SUGAR, sweets, anything with added sugar, corn syrup or sweeteners - must read labels as even foods advertised as "healthy" often are loaded with sugar -if you must have a sweetener, small amounts of stevia may be best -sweetened beverages and artificially sweetened beverages -simple starches (rice, bread, potatoes, pasta, chips, etc - small amounts of 100% whole grains are ok) -red meat, pork, butter -fried foods, fast food, processed food, excessive dairy, eggs and coconut.  3)Get at least 150 minutes of sweaty aerobic exercise per week.  4)Reduce stress - consider counseling, meditation and relaxation to balance other aspects of your life.     Kriste BasqueKIM, HANNAH R., DO

## 2017-10-04 ENCOUNTER — Ambulatory Visit: Payer: Self-pay | Admitting: Orthopedic Surgery

## 2017-10-04 ENCOUNTER — Ambulatory Visit (INDEPENDENT_AMBULATORY_CARE_PROVIDER_SITE_OTHER): Payer: BC Managed Care – PPO | Admitting: Family Medicine

## 2017-10-04 ENCOUNTER — Encounter: Payer: Self-pay | Admitting: Family Medicine

## 2017-10-04 ENCOUNTER — Ambulatory Visit: Payer: BC Managed Care – PPO | Admitting: Family Medicine

## 2017-10-04 VITALS — BP 120/70 | HR 81 | Temp 98.0°F | Ht 64.0 in

## 2017-10-04 DIAGNOSIS — I10 Essential (primary) hypertension: Secondary | ICD-10-CM

## 2017-10-04 DIAGNOSIS — Z23 Encounter for immunization: Secondary | ICD-10-CM

## 2017-10-04 NOTE — Patient Instructions (Addendum)
BEFORE YOU LEAVE: -pneumococcal 23 -labs -follow up: 3-4 months, CPE  Check with pharmacy about your blood pressure medication  We recommend the following healthy lifestyle for LIFE: 1) Small portions. But, make sure to get regular (at least 3 per day), healthy meals and small healthy snacks if needed.  2) Eat a healthy clean diet.   TRY TO EAT: -at least 5-7 servings of low sugar, colorful, and nutrient rich vegetables per day (not corn, potatoes or bananas.) -berries are the best choice if you wish to eat fruit (only eat small amounts if trying to reduce weight)  -lean meets (fish, white meat of chicken or [place]) -vegan proteins for some meals - beans or tofu, whole grains, nuts and seeds -Replace bad fats with good fats - good fats include: fish, nuts and seeds, canola oil, olive oil -small amounts of low fat or non fat dairy -small amounts of100 % whole grains - check the lables -drink plenty of water  AVOID: -SUGAR, sweets, anything with added sugar, corn syrup or sweeteners - must read labels as even foods advertised as "healthy" often are loaded with sugar -if you must have a sweetener, small amounts of stevia may be best -sweetened beverages and artificially sweetened beverages -simple starches (rice, bread, potatoes, pasta, chips, etc - small amounts of 100% whole grains are ok) -red meat, pork, butter -fried foods, fast food, processed food, excessive dairy, eggs and coconut.  3)Get at least 150 minutes of sweaty aerobic exercise per week.  4)Reduce stress - consider counseling, meditation and relaxation to balance other aspects of your life.

## 2017-10-04 NOTE — H&P (Signed)
Paula Kelley DOB: [birth year] Married / Language: [place]; Flemish / Race: Black or African American Female Date of Admission:  10/31/2017 CC:  Right knee pain  History of Present Illness The patient is a 66 year old female who comes in  for a preoperative History and Physical. The patient is scheduled for a right total knee arthroplasty to be performed by Dr. Gus RankinFrank V. Aluisio, MD at [facility] on 10-31-2017. The patient is a 66 year old female also presenting months out from her left total knee arthroplasty. The patient states that she is doing well at this time. Her left knee pain is controlled at this time and describe their pain as mild. The patient is currently doing home exercise program (recumbent bike 20 minute a day.). The patient feels that they are progressing well at this time. Note for "Post TKA": She takes ibuprofen at night for any pain she may have. Her left knee is doing great at this time. She does have considerable pain and problems in her right knee and is now reached a point where she is ready to get the other knee replaced at this time. She has had injections at a failed in the past. She is now ready for surgery. They have been treated conservatively in the past for the above stated problem and despite conservative measures, they continue to have progressive pain and severe functional limitations and dysfunction. They have failed non-operative management including home exercise, medications, and injections. It is felt that they would benefit from undergoing total joint replacement. Risks and benefits of the procedure have been discussed with the patient and they elect to proceed with surgery. There are no active contraindications to surgery such as ongoing infection or rapidly progressive neurological disease.  Problem List/Past Medical Status post total left knee replacement (Z61.096(Z96.652)  Primary osteoarthritis of right knee (M17.11)  High blood pressure  Varicose veins   Menopause   Allergies Sulfa Drugs   Family History  Heart Disease  mother and father Heart disease in female family member before age 66   Social History Alcohol use  current drinker; drinks wine; only occasionally per week Children  2 Current work status  working full time Drug/Alcohol Rehab (Currently)  no Exercise  Exercises weekly; does running / walking Illicit drug use  no Living situation  live with spouse Marital status  married Most recent primary occupation  Scientist, 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 Calcium Carbonate (600MG  Tablet, Oral) Active. Fish Oil (1000MG  Capsule, Oral) Active. Iron Active. Cholecalciferol (1000UNIT Tablet, Oral) Active. Fluticasone Propionate (50MCG/ACT Suspension, Nasal) Active. Aspirin (81MG  Tablet DR, Oral) Active. Xyzal (2.5MG /5ML Solution, Oral) Active. Amlodipine Besy-Benazepril HCl (5-20MG  Capsule, Oral) Active. Mucinex Active.  Past Surgical History Total Knee Replacement - Left    Review of Systems General Not Present- Chills, Fatigue, Fever, Memory Loss, Night Sweats, Weight Gain and Weight Loss. Skin Not Present- Eczema, Hives, Itching, Lesions and Rash. HEENT Not Present- Dentures, Double Vision, Headache, Hearing Loss, Tinnitus and Visual Loss. Respiratory Present- Allergies. Not Present- Chronic Cough, Coughing up blood, Shortness of breath at rest and Shortness of breath with exertion. Cardiovascular Not Present- Chest Pain, Difficulty Breathing Lying Down, Murmur, Palpitations, Racing/skipping heartbeats and Swelling. Gastrointestinal Present- Constipation. Not Present- Abdominal Pain, Bloody Stool, Diarrhea, Difficulty Swallowing, Heartburn, Jaundice, Loss of appetitie, Nausea and Vomiting. Female Genitourinary  Not Present- Blood in Urine,  Discharge, Flank Pain, Incontinence, Painful Urination, Urgency, Urinary frequency, Urinary Retention, Urinating at Night and Weak urinary stream. Musculoskeletal Present- Joint Pain and Morning Stiffness. Not Present- Back Pain, Joint Swelling, Muscle Pain, Muscle Weakness and Spasms. Neurological Not Present- Blackout spells, Difficulty with balance, Dizziness, Paralysis, Tremor and Weakness. Psychiatric Not Present- Insomnia.  Vitals Weight: 175 lb Height: 64in Weight was reported by patient. Height was reported by patient. Body Surface Area: 1.85 m Body Mass Index: 30.04 kg/m  Pulse: 84 (Regular)  BP: 128/72 (Sitting, Right Arm, Standard)       Physical Exam  General Mental Status -Alert, cooperative and good historian. General Appearance-pleasant, Not in acute distress. Orientation-Oriented X3. Build & Nutrition-Well nourished and Well developed.  Head and Neck Head-normocephalic, atraumatic . Neck Global Assessment - supple, no bruit auscultated on the right, no bruit auscultated on the left.  Eye Pupil - Bilateral-Regular and Round. Motion - Bilateral-EOMI.  Chest and Lung Exam Auscultation Breath sounds - clear at anterior chest wall and clear at posterior chest wall. Adventitious sounds - No Adventitious sounds.  Cardiovascular Auscultation Rhythm - Regular rate and rhythm. Heart Sounds - S1 WNL and S2 WNL. Murmurs & Other Heart Sounds - Auscultation of the heart reveals - No Murmurs.  Abdomen Palpation/Percussion Tenderness - Abdomen is non-tender to palpation. Rigidity (guarding) - Abdomen is soft. Auscultation Auscultation of the abdomen reveals - Bowel sounds normal.  Female Genitourinary Note: Not done, not pertinent to present illness   Musculoskeletal Note: Left knee shows well-healed scar. Range of motion 0-110. She has excellent alignment of that knee. There is no tenderness or instability noted. Her right knee shows  valgus deformity range 5-115 market crepitus on range of motion with tenderness lateral greater than medial and no instability noted. Radiographs AP and lateral left knee show her prosthesis to be in excellent position with no periprosthetic abnormalities. Right knee has bone-on-bone arthritis lateral patellofemoral.   Assessment & Plan Status post total left knee replacement (R60.454(Z96.652) Primary osteoarthritis of right knee (M17.11)  Note:Surgical Plans: Right Total Knee Replacement  Disposition: Home with husband, HHPT  PCP: Dr. Selena BattenKim - Patient has been seen preoperatively and felt to be stable for surgery. "Patient is optimized for surgery..."  IV TXA  Anesthesia Issues: None  Patient was instructed on what medications to stop prior to surgery.  Signed electronically by Lauraine RinneAlexzandrew L Hisham Provence, III PA-C

## 2017-10-04 NOTE — Addendum Note (Signed)
Addended by: Johnella MoloneyFUNDERBURK, JO A on: 10/04/2017 02:51 PM   Modules accepted: Orders

## 2017-10-05 ENCOUNTER — Ambulatory Visit: Payer: Self-pay

## 2017-10-05 NOTE — Telephone Encounter (Signed)
Pt. called to report she had a pneumonia shot yesterday.  She stated she noticed redness, swelling and soreness of the inner aspect of left arm, just above the elbow. Denies these symptoms at the actual injection site of the left arm.  Denies fever. Denies swelling of left forearm or hand.  Advised that the pneumonia vaccine can cause pain, redness and swelling either at the injection site, or at other site on body.  Care advice given per protocol.  Advised to allow 48-72 hrs from the injection for symptoms to improve;  Encouraged to call if no improvement or worsening.  Pt. verb. Understanding of instructions; agrees with plan.     Reason for Disposition . Injection site reaction to any vaccine  Answer Assessment - Initial Assessment Questions 1. SYMPTOMS: "What is the main symptom?" (e.g., redness, swelling, pain)      Redness, swelling on left arm @ inner aspect of the arm above the elbow.  2. ONSET: "When was the vaccine (shot) given?" "How much later did the __________ begin?" (e.g., hours, days ago)      Just noticed today 3. SEVERITY: "How bad is it?"      Slightly sore 4. FEVER: "Is there a fever?" If so, ask: "What is it, how was it measured, and when did it start?"      Denies fever. 5. IMMUNIZATIONS GIVEN: "What shots have you recently received?"    Pneumonia shot 6. PAST REACTIONS: "Have you reacted to immunizations before?" If so, ask: "What happened?"     Never had a pneumonia shot;  Has had flu shots before, but hasn't had any reactions  7. OTHER SYMPTOMS: "Do you have any other symptoms?"     No  Protocols used: IMMUNIZATION REACTIONS-A-AH

## 2017-10-26 NOTE — Patient Instructions (Signed)
Voncille LoOlga L Coopersmith  10/26/2017   Your procedure is scheduled on: 10-31-17   [facility]  Entrance Report to Admitting at 6:15 AM   Call this number if you have problems the morning of surgery [phone]   Remember: Do not eat food or drink liquids :After Midnight.     Take these medicines the morning of surgery with A SIP OF WATER: None                                You may not have any metal on your body including hair pins and              piercings  Do not wear jewelry, make-up, lotions, powders or perfumes, deodorant             Do not wear nail polish.  Do not shave  48 hours prior to surgery.             Do not bring valuables to the hospital. [facility] IS NOT             RESPONSIBLE   FOR VALUABLES.  Contacts, dentures or bridgework may not be worn into surgery.  Leave suitcase in the car. After surgery it may be brought to your room.                Please read over the following fact sheets you were given: _____________________________________________________________________          [facility] - Preparing for Surgery Before surgery, you can play an important role.  Because skin is not sterile, your skin needs to be as free of germs as possible.  You can reduce the number of germs on your skin by washing with CHG (chlorahexidine gluconate) soap before surgery.  CHG is an antiseptic cleaner which kills germs and bonds with the skin to continue killing germs even after washing. Please DO NOT use if you have an allergy to CHG or antibacterial soaps.  If your skin becomes reddened/irritated stop using the CHG and inform your nurse when you arrive at Short Stay. Do not shave (including legs and underarms) for at least 48 hours prior to the first CHG shower.  You may shave your face/neck. Please follow these instructions carefully:  1.  Shower with CHG Soap the night before surgery and the  morning of Surgery.  2.  If you choose to wash your  hair, wash your hair first as usual with your  normal  shampoo.  3.  After you shampoo, rinse your hair and body thoroughly to remove the  shampoo.                           4.  Use CHG as you would any other liquid soap.  You can apply chg directly  to the skin and wash                       Gently with a scrungie or clean washcloth.  5.  Apply the CHG Soap to your body ONLY FROM THE NECK DOWN.   Do not use on face/ open  Wound or open sores. Avoid contact with eyes, ears mouth and genitals (private parts).                       Wash face,  Genitals (private parts) with your normal soap.             6.  Wash thoroughly, paying special attention to the area where your surgery  will be performed.  7.  Thoroughly rinse your body with warm water from the neck down.  8.  DO NOT shower/wash with your normal soap after using and rinsing off  the CHG Soap.                9.  Pat yourself dry with a clean towel.            10.  Wear clean pajamas.            11.  Place clean sheets on your bed the night of your first shower and do not  sleep with pets. Day of Surgery : Do not apply any lotions/deodorants the morning of surgery.  Please wear clean clothes to the hospital/surgery center.  FAILURE TO FOLLOW THESE INSTRUCTIONS MAY RESULT IN THE CANCELLATION OF YOUR SURGERY PATIENT SIGNATURE_________________________________  NURSE SIGNATURE__________________________________  ________________________________________________________________________   Adam Phenix  An incentive spirometer is a tool that can help keep your lungs clear and active. This tool measures how well you are filling your lungs with each breath. Taking long deep breaths may help reverse or decrease the chance of developing breathing (pulmonary) problems (especially infection) following:  A long period of time when you are unable to move or be active. BEFORE THE PROCEDURE   If the spirometer  includes an indicator to show your best effort, your nurse or respiratory therapist will set it to a desired goal.  If possible, sit up straight or lean slightly forward. Try not to slouch.  Hold the incentive spirometer in an upright position. INSTRUCTIONS FOR USE  1. Sit on the edge of your bed if possible, or sit up as far as you can in bed or on a chair. 2. Hold the incentive spirometer in an upright position. 3. Breathe out normally. 4. Place the mouthpiece in your mouth and seal your lips tightly around it. 5. Breathe in slowly and as deeply as possible, raising the piston or the ball toward the top of the column. 6. Hold your breath for 3-5 seconds or for as long as possible. Allow the piston or ball to fall to the bottom of the column. 7. Remove the mouthpiece from your mouth and breathe out normally. 8. Rest for a few seconds and repeat Steps 1 through 7 at least 10 times every 1-2 hours when you are awake. Take your time and take a few normal breaths between deep breaths. 9. The spirometer may include an indicator to show your best effort. Use the indicator as a goal to work toward during each repetition. 10. After each set of 10 deep breaths, practice coughing to be sure your lungs are clear. If you have an incision (the cut made at the time of surgery), support your incision when coughing by placing a pillow or rolled up towels firmly against it. Once you are able to get out of bed, walk around indoors and cough well. You may stop using the incentive spirometer when instructed by your caregiver.  RISKS AND COMPLICATIONS  Take your time so you do not get  dizzy or light-headed.  If you are in pain, you may need to take or ask for pain medication before doing incentive spirometry. It is harder to take a deep breath if you are having pain. AFTER USE  Rest and breathe slowly and easily.  It can be helpful to keep track of a log of your progress. Your caregiver can provide you with a  simple table to help with this. If you are using the spirometer at home, follow these instructions: [place] IF:   You are having difficultly using the spirometer.  You have trouble using the spirometer as often as instructed.  Your pain medication is not giving enough relief while using the spirometer.  You develop fever of 100.5 F (38.1 C) or higher. SEEK IMMEDIATE MEDICAL CARE IF:   You cough up bloody sputum that had not been present before.  You develop fever of 102 F (38.9 C) or greater.  You develop worsening pain at or near the incision site. MAKE SURE YOU:   Understand these instructions.  Will watch your condition.  Will get help right away if you are not doing well or get worse. Document Released: 02/14/2007 Document Revised: 12/27/2011 Document Reviewed: 04/17/2007 ExitCare Patient Information 2014 Exit[employer], [place].   ________________________________________________________________________  WHAT IS A BLOOD TRANSFUSION? Blood Transfusion Information  A transfusion is the replacement of blood or some of its parts. Blood is made up of multiple cells which provide different functions.  Red blood cells carry oxygen and are used for blood loss replacement.  White blood cells fight against infection.  Platelets control bleeding.  Plasma helps clot blood.  Other blood products are available for specialized needs, such as hemophilia or other clotting disorders. BEFORE THE TRANSFUSION  Who gives blood for transfusions?   Healthy volunteers who are fully evaluated to make sure their blood is safe. This is blood bank blood. Transfusion therapy is the safest it has ever been in the practice of medicine. Before blood is taken from a donor, a complete history is taken to make sure that person has no history of diseases nor engages in risky social behavior (examples are intravenous drug use or sexual activity with multiple partners). The donor's travel history  is screened to minimize risk of transmitting infections, such as malaria. The donated blood is tested for signs of infectious diseases, such as HIV and hepatitis. The blood is then tested to be sure it is compatible with you in order to minimize the chance of a transfusion reaction. If you or a relative donates blood, this is often done in anticipation of surgery and is not appropriate for emergency situations. It takes many days to process the donated blood. RISKS AND COMPLICATIONS Although transfusion therapy is very safe and saves many lives, the main dangers of transfusion include:   Getting an infectious disease.  Developing a transfusion reaction. This is an allergic reaction to something in the blood you were given. Every precaution is taken to prevent this. The decision to have a blood transfusion has been considered carefully by your caregiver before blood is given. Blood is not given unless the benefits outweigh the risks. AFTER THE TRANSFUSION  Right after receiving a blood transfusion, you will usually feel much better and more energetic. This is especially true if your red blood cells have gotten low (anemic). The transfusion raises the level of the red blood cells which carry oxygen, and this usually causes an energy increase.  The nurse administering the transfusion will  monitor you carefully for complications. HOME CARE INSTRUCTIONS  No special instructions are needed after a transfusion. You may find your energy is better. Speak with your caregiver about any limitations on activity for underlying diseases you may have. SEEK MEDICAL CARE IF:   Your condition is not improving after your transfusion.  You develop redness or irritation at the intravenous (IV) site. SEEK IMMEDIATE MEDICAL CARE IF:  Any of the following symptoms occur over the next 12 hours:  Shaking chills.  You have a temperature by mouth above 102 F (38.9 C), not controlled by medicine.  Chest, back, or  muscle pain.  People around you feel you are not acting correctly or are confused.  Shortness of breath or difficulty breathing.  Dizziness and fainting.  You get a rash or develop hives.  You have a decrease in urine output.  Your urine turns a dark color or changes to pink, red, or brown. Any of the following symptoms occur over the next 10 days:  You have a temperature by mouth above 102 F (38.9 C), not controlled by medicine.  Shortness of breath.  Weakness after normal activity.  The white part of the eye turns yellow (jaundice).  You have a decrease in the amount of urine or are urinating less often.  Your urine turns a dark color or changes to pink, red, or brown. Document Released: 10/01/2000 Document Revised: 12/27/2011 Document Reviewed: 05/20/2008 [facility] Patient Information 2014 [place], [place].  _______________________________________________________________________

## 2017-10-26 NOTE — Progress Notes (Signed)
09-01-17 Surgical clearance on chart from Dr. Selena BattenKim  10-28-16 [facility]) EKG

## 2017-10-27 ENCOUNTER — Other Ambulatory Visit: Payer: Self-pay

## 2017-10-27 ENCOUNTER — Encounter: Payer: Self-pay | Admitting: Family Medicine

## 2017-10-27 ENCOUNTER — Encounter (HOSPITAL_COMMUNITY)
Admission: RE | Admit: 2017-10-27 | Discharge: 2017-10-27 | Disposition: A | Discharge status: Home / Self Care | Payer: BC Managed Care – PPO | Source: Ambulatory Visit | Attending: Orthopedic Surgery | Admitting: Orthopedic Surgery

## 2017-10-27 ENCOUNTER — Encounter (HOSPITAL_COMMUNITY): Payer: Self-pay

## 2017-10-27 DIAGNOSIS — I10 Essential (primary) hypertension: Secondary | ICD-10-CM | POA: Diagnosis not present

## 2017-10-27 DIAGNOSIS — Z01810 Encounter for preprocedural cardiovascular examination: Secondary | ICD-10-CM | POA: Diagnosis present

## 2017-10-27 DIAGNOSIS — Z01812 Encounter for preprocedural laboratory examination: Secondary | ICD-10-CM | POA: Diagnosis not present

## 2017-10-27 DIAGNOSIS — M1711 Unilateral primary osteoarthritis, right knee: Secondary | ICD-10-CM | POA: Diagnosis not present

## 2017-10-27 LAB — COMPREHENSIVE METABOLIC PANEL
ALK PHOS: 75 U/L (ref 38–126)
ALT: 14 U/L (ref 14–54)
AST: 19 U/L (ref 15–41)
Albumin: 4.2 g/dL (ref 3.5–5.0)
Anion gap: 4 — ABNORMAL LOW (ref 5–15)
BUN: 10 mg/dL (ref 6–20)
CALCIUM: 9.5 mg/dL (ref 8.9–10.3)
CO2: 30 mmol/L (ref 22–32)
Chloride: 104 mmol/L (ref 101–111)
Creatinine, Ser: 0.7 mg/dL (ref 0.44–1.00)
Glucose, Bld: 91 mg/dL (ref 65–99)
Potassium: 3.8 mmol/L (ref 3.5–5.1)
Sodium: 138 mmol/L (ref 135–145)
TOTAL PROTEIN: 8 g/dL (ref 6.5–8.1)
Total Bilirubin: 0.8 mg/dL (ref 0.3–1.2)

## 2017-10-27 LAB — PROTIME-INR
INR: 0.94
PROTHROMBIN TIME: 12.5 s (ref 11.4–15.2)

## 2017-10-27 LAB — CBC
HEMATOCRIT: 35.6 % — AB (ref 36.0–46.0)
Hemoglobin: 11.4 g/dL — ABNORMAL LOW (ref 12.0–15.0)
MCH: 28.5 pg (ref 26.0–34.0)
MCHC: 32 g/dL (ref 30.0–36.0)
MCV: 89 fL (ref 78.0–100.0)
PLATELETS: 280 10*3/uL (ref 150–400)
RBC: 4 MIL/uL (ref 3.87–5.11)
RDW: 13.6 % (ref 11.5–15.5)
WBC: 6.2 10*3/uL (ref 4.0–10.5)

## 2017-10-27 LAB — SURGICAL PCR SCREEN
MRSA, PCR: INVALID — AB
Staphylococcus aureus: INVALID — AB

## 2017-10-27 LAB — APTT: aPTT: 28 seconds (ref 24–36)

## 2017-10-29 LAB — MRSA CULTURE: Culture: NOT DETECTED

## 2017-10-30 MED ORDER — TRANEXAMIC ACID 1000 MG/10ML IV SOLN
1000.0000 mg | INTRAVENOUS | Status: AC
  Administered 2017-10-31: 1000 mg via INTRAVENOUS
  Filled 2017-10-30: qty 1100

## 2017-10-30 MED ORDER — BUPIVACAINE LIPOSOME 1.3 % IJ SUSP
20.0000 mL | Freq: Once | INTRAMUSCULAR | Status: DC
  Filled 2017-10-30: qty 20

## 2017-10-30 NOTE — H&P (Signed)
Paula Kelley DOB: 09/12/1951 Married / Language: Dutch; Flemish / Race: Black or African American Female Date of Admission:  10/31/2017 CC:  Right knee pain  History of Present Illness The patient is a 66 year old female who comes in  for a preoperative History and Physical. The patient is scheduled for a right total knee arthroplasty to be performed by Dr. Frank V. Aluisio, MD at [facility] on 10-31-2017. The patient is a 67 year old female also presenting months out from her left total knee arthroplasty. The patient states that she is doing well at this time. Her left knee pain is controlled at this time and describe their pain as mild. The patient is currently doing home exercise program (recumbent bike 20 minute a day.). The patient feels that they are progressing well at this time. Note for "Post TKA": She takes ibuprofen at night for any pain she may have. Her left knee is doing great at this time. She does have considerable pain and problems in her right knee and is now reached a point where she is ready to get the other knee replaced at this time. She has had injections at a failed in the past. She is now ready for surgery. They have been treated conservatively in the past for the above stated problem and despite conservative measures, they continue to have progressive pain and severe functional limitations and dysfunction. They have failed non-operative management including home exercise, medications, and injections. It is felt that they would benefit from undergoing total joint replacement. Risks and benefits of the procedure have been discussed with the patient and they elect to proceed with surgery. There are no active contraindications to surgery such as ongoing infection or rapidly progressive neurological disease.  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)  Primary osteoarthritis of right knee (M17.11)  High blood pressure  Varicose veins   Menopause   Allergies Sulfa Drugs   Family History  Heart Disease  mother and father Heart disease in female family member before age 65   Social History Alcohol use  current drinker; drinks wine; only occasionally per week Children  2 Current work status  working full time Drug/Alcohol Rehab (Currently)  no Exercise  Exercises weekly; does running / walking Illicit drug use  no Living situation  live with spouse Marital status  married Most recent primary occupation  Library Assistant Pain Contract  no Previously in rehab  no Tobacco / smoke exposure  no Tobacco use  never smoker Advance Directives  Living Will  Medication History  Ibuprofen Active. Calcium Carbonate (600MG Tablet, Oral) Active. Fish Oil (1000MG Capsule, Oral) Active. Iron Active. Cholecalciferol (1000UNIT Tablet, Oral) Active. Fluticasone Propionate (50MCG/ACT Suspension, Nasal) Active. Aspirin (81MG Tablet DR, Oral) Active. Xyzal (2.5MG/5ML Solution, Oral) Active. Amlodipine Besy-Benazepril HCl (5-20MG Capsule, Oral) Active. Mucinex Active.  Past Surgical History Total Knee Replacement - Left    Review of Systems General Not Present- Chills, Fatigue, Fever, Memory Loss, Night Sweats, Weight Gain and Weight Loss. Skin Not Present- Eczema, Hives, Itching, Lesions and Rash. HEENT Not Present- Dentures, Double Vision, Headache, Hearing Loss, Tinnitus and Visual Loss. Respiratory Present- Allergies. Not Present- Chronic Cough, Coughing up blood, Shortness of breath at rest and Shortness of breath with exertion. Cardiovascular Not Present- Chest Pain, Difficulty Breathing Lying Down, Murmur, Palpitations, Racing/skipping heartbeats and Swelling. Gastrointestinal Present- Constipation. Not Present- Abdominal Pain, Bloody Stool, Diarrhea, Difficulty Swallowing, Heartburn, Jaundice, Loss of appetitie, Nausea and Vomiting. Female Genitourinary   Not Present- Blood in  Urine, Discharge, Flank Pain, Incontinence, Painful Urination, Urgency, Urinary frequency, Urinary Retention, Urinating at Night and Weak urinary stream. Musculoskeletal Present- Joint Pain and Morning Stiffness. Not Present- Back Pain, Joint Swelling, Muscle Pain, Muscle Weakness and Spasms. Neurological Not Present- Blackout spells, Difficulty with balance, Dizziness, Paralysis, Tremor and Weakness. Psychiatric Not Present- Insomnia.  Vitals Weight: 175 lb Height: 64in Weight was reported by patient. Height was reported by patient. Body Surface Area: 1.85 m Body Mass Index: 30.04 kg/m  Pulse: 84 (Regular)  BP: 128/72 (Sitting, Right Arm, Standard)       Physical Exam  General Mental Status -Alert, cooperative and good historian. General Appearance-pleasant, Not in acute distress. Orientation-Oriented X3. Build & Nutrition-Well nourished and Well developed.  Head and Neck Head-normocephalic, atraumatic . Neck Global Assessment - supple, no bruit auscultated on the right, no bruit auscultated on the left.  Eye Pupil - Bilateral-Regular and Round. Motion - Bilateral-EOMI.  Chest and Lung Exam Auscultation Breath sounds - clear at anterior chest wall and clear at posterior chest wall. Adventitious sounds - No Adventitious sounds.  Cardiovascular Auscultation Rhythm - Regular rate and rhythm. Heart Sounds - S1 WNL and S2 WNL. Murmurs & Other Heart Sounds - Auscultation of the heart reveals - No Murmurs.  Abdomen Palpation/Percussion Tenderness - Abdomen is non-tender to palpation. Rigidity (guarding) - Abdomen is soft. Auscultation Auscultation of the abdomen reveals - Bowel sounds normal.  Female Genitourinary Note: Not done, not pertinent to present illness   Musculoskeletal Note: Left knee shows well-healed scar. Range of motion 0-110. She has excellent alignment of that knee. There is no tenderness or instability noted.  Her right knee shows valgus deformity range 5-115 market crepitus on range of motion with tenderness lateral greater than medial and no instability noted. Radiographs AP and lateral left knee show her prosthesis to be in excellent position with no periprosthetic abnormalities. Right knee has bone-on-bone arthritis lateral patellofemoral.   Assessment & Plan Status post total left knee replacement (W11.914(Z96.652) Primary osteoarthritis of right knee (M17.11)  Note:Surgical Plans: Right Total Knee Replacement  Disposition: Home with husband, HHPT  PCP: Dr. Selena BattenKim - Patient has been seen preoperatively and felt to be stable for surgery. "Patient is optimized for surgery..."  IV TXA  Anesthesia Issues: None  Patient was instructed on what medications to stop prior to surgery.  Signed electronically by Lauraine RinneAlexzandrew L Perkins, III PA-C

## 2017-10-30 NOTE — Anesthesia Preprocedure Evaluation (Addendum)
Anesthesia Evaluation  Patient identified by MRN, date of birth, ID band Patient awake    Reviewed: Allergy & Precautions, NPO status , Patient's Chart, lab work & pertinent test results  Airway Mallampati: I  TM Distance: >3 FB     Dental   Pulmonary    Pulmonary exam normal        Cardiovascular hypertension, Pt. on medications Normal cardiovascular exam Rhythm:Regular     Neuro/Psych  Headaches, Anxiety    GI/Hepatic   Endo/Other    Renal/GU      Musculoskeletal  (+) Arthritis ,   Abdominal   Peds  Hematology  (+) anemia ,   Anesthesia Other Findings   Reproductive/Obstetrics                             Anesthesia Physical  Anesthesia Plan  ASA: I  Anesthesia Plan: Spinal and MAC   Post-op Pain Management:  Regional for Post-op pain   Induction:   PONV Risk Score and Plan: 2 and Ondansetron and Propofol infusion  Airway Management Planned: Simple Face Mask  Additional Equipment:   Intra-op Plan:   Post-operative Plan:   Informed Consent: I have reviewed the patients History and Physical, chart, labs and discussed the procedure including the risks, benefits and alternatives for the proposed anesthesia with the patient or authorized representative who has indicated his/her understanding and acceptance.   Dental advisory given  Plan Discussed with: CRNA  Anesthesia Plan Comments:         Anesthesia Quick Evaluation

## 2017-10-31 ENCOUNTER — Inpatient Hospital Stay (HOSPITAL_COMMUNITY): Payer: BC Managed Care – PPO | Admitting: Anesthesiology

## 2017-10-31 ENCOUNTER — Inpatient Hospital Stay (HOSPITAL_COMMUNITY)
Admission: RE | Admit: 2017-10-31 | Discharge: 2017-11-02 | DRG: 470 | Disposition: A | Discharge status: Home Health | Payer: BC Managed Care – PPO | Source: Ambulatory Visit | Attending: Orthopedic Surgery | Admitting: Orthopedic Surgery

## 2017-10-31 ENCOUNTER — Encounter (HOSPITAL_COMMUNITY)
Admission: RE | Disposition: A | Discharge status: Home Health | Payer: Self-pay | Source: Ambulatory Visit | Attending: Orthopedic Surgery

## 2017-10-31 ENCOUNTER — Other Ambulatory Visit: Payer: Self-pay

## 2017-10-31 ENCOUNTER — Encounter (HOSPITAL_COMMUNITY): Payer: Self-pay | Admitting: Anesthesiology

## 2017-10-31 DIAGNOSIS — Z96652 Presence of left artificial knee joint: Secondary | ICD-10-CM | POA: Diagnosis present

## 2017-10-31 DIAGNOSIS — M1710 Unilateral primary osteoarthritis, unspecified knee: Secondary | ICD-10-CM

## 2017-10-31 DIAGNOSIS — Z6830 Body mass index (BMI) 30.0-30.9, adult: Secondary | ICD-10-CM | POA: Diagnosis not present

## 2017-10-31 DIAGNOSIS — M1711 Unilateral primary osteoarthritis, right knee: Principal | ICD-10-CM | POA: Diagnosis present

## 2017-10-31 DIAGNOSIS — D509 Iron deficiency anemia, unspecified: Secondary | ICD-10-CM | POA: Diagnosis present

## 2017-10-31 DIAGNOSIS — E669 Obesity, unspecified: Secondary | ICD-10-CM | POA: Diagnosis present

## 2017-10-31 DIAGNOSIS — I8390 Asymptomatic varicose veins of unspecified lower extremity: Secondary | ICD-10-CM | POA: Diagnosis present

## 2017-10-31 DIAGNOSIS — M179 Osteoarthritis of knee, unspecified: Secondary | ICD-10-CM | POA: Diagnosis present

## 2017-10-31 DIAGNOSIS — M25561 Pain in right knee: Secondary | ICD-10-CM | POA: Diagnosis present

## 2017-10-31 DIAGNOSIS — Z7982 Long term (current) use of aspirin: Secondary | ICD-10-CM | POA: Diagnosis not present

## 2017-10-31 DIAGNOSIS — I10 Essential (primary) hypertension: Secondary | ICD-10-CM | POA: Diagnosis present

## 2017-10-31 DIAGNOSIS — Z882 Allergy status to sulfonamides status: Secondary | ICD-10-CM

## 2017-10-31 HISTORY — PX: TOTAL KNEE ARTHROPLASTY: SHX125

## 2017-10-31 LAB — TYPE AND SCREEN
ABO/RH(D): B POS
Antibody Screen: NEGATIVE

## 2017-10-31 SURGERY — ARTHROPLASTY, KNEE, TOTAL
Anesthesia: Monitor Anesthesia Care | Site: Knee | Laterality: Right

## 2017-10-31 MED ORDER — MENTHOL 3 MG MT LOZG: 1.0000 | LOZENGE | OROMUCOSAL | Status: DC | PRN

## 2017-10-31 MED ORDER — ACETAMINOPHEN 500 MG PO TABS
1000.0000 mg | ORAL_TABLET | Freq: Four times a day (QID) | ORAL | Status: AC
  Administered 2017-10-31 – 2017-11-01 (×4): 1000 mg via ORAL
  Filled 2017-10-31 (×4): qty 2

## 2017-10-31 MED ORDER — SODIUM CHLORIDE 0.9 % IJ SOLN
INTRAMUSCULAR | Status: DC | PRN
  Administered 2017-10-31: 60 mL

## 2017-10-31 MED ORDER — CHLORHEXIDINE GLUCONATE 4 % EX LIQD: 60.0000 mL | Freq: Once | CUTANEOUS | Status: DC

## 2017-10-31 MED ORDER — GABAPENTIN 300 MG PO CAPS
300.0000 mg | ORAL_CAPSULE | Freq: Once | ORAL | Status: AC
  Administered 2017-10-31: 300 mg via ORAL
  Filled 2017-10-31: qty 1

## 2017-10-31 MED ORDER — ACETAMINOPHEN 325 MG PO TABS: 650.0000 mg | ORAL_TABLET | ORAL | Status: DC | PRN

## 2017-10-31 MED ORDER — METOCLOPRAMIDE HCL 5 MG/ML IJ SOLN: 5.0000 mg | Freq: Three times a day (TID) | INTRAMUSCULAR | Status: DC | PRN

## 2017-10-31 MED ORDER — OXYCODONE HCL 5 MG PO TABS
5.0000 mg | ORAL_TABLET | ORAL | Status: DC | PRN
  Administered 2017-10-31 – 2017-11-01 (×4): 5 mg via ORAL
  Filled 2017-10-31 (×6): qty 1

## 2017-10-31 MED ORDER — POTASSIUM GLUCONATE 595 (99 K) MG PO TABS
595.0000 mg | ORAL_TABLET | Freq: Every day | ORAL | Status: DC
  Administered 2017-10-31 – 2017-11-02 (×2): 595 mg via ORAL
  Filled 2017-10-31 (×4): qty 1

## 2017-10-31 MED ORDER — RIVAROXABAN 10 MG PO TABS
10.0000 mg | ORAL_TABLET | Freq: Every day | ORAL | Status: DC
  Administered 2017-11-01 – 2017-11-02 (×2): 10 mg via ORAL
  Filled 2017-10-31 (×2): qty 1

## 2017-10-31 MED ORDER — LACTATED RINGERS IV SOLN
INTRAVENOUS | Status: DC
  Administered 2017-10-31: 07:00:00 via INTRAVENOUS

## 2017-10-31 MED ORDER — FLUTICASONE PROPIONATE 50 MCG/ACT NA SUSP: 2.0000 | Freq: Every day | NASAL | Status: DC | PRN

## 2017-10-31 MED ORDER — MIDAZOLAM HCL 5 MG/5ML IJ SOLN
INTRAMUSCULAR | Status: DC | PRN
  Administered 2017-10-31: 2 mg via INTRAVENOUS

## 2017-10-31 MED ORDER — BUPIVACAINE LIPOSOME 1.3 % IJ SUSP
INTRAMUSCULAR | Status: DC | PRN
  Administered 2017-10-31: 20 mL

## 2017-10-31 MED ORDER — METHOCARBAMOL 500 MG PO TABS
500.0000 mg | ORAL_TABLET | Freq: Four times a day (QID) | ORAL | Status: DC | PRN
  Administered 2017-11-01 – 2017-11-02 (×3): 500 mg via ORAL
  Filled 2017-10-31 (×3): qty 1

## 2017-10-31 MED ORDER — MIDAZOLAM HCL 2 MG/2ML IJ SOLN
INTRAMUSCULAR | Status: AC
  Administered 2017-10-31: 2 mg via INTRAVENOUS
  Filled 2017-10-31: qty 2

## 2017-10-31 MED ORDER — CEFAZOLIN SODIUM-DEXTROSE 2-4 GM/100ML-% IV SOLN
INTRAVENOUS | Status: AC
  Filled 2017-10-31: qty 100

## 2017-10-31 MED ORDER — SODIUM CHLORIDE 0.9 % IV SOLN
INTRAVENOUS | Status: DC
  Administered 2017-10-31: 12:00:00 via INTRAVENOUS

## 2017-10-31 MED ORDER — CEFAZOLIN SODIUM-DEXTROSE 2-4 GM/100ML-% IV SOLN
2.0000 g | Freq: Four times a day (QID) | INTRAVENOUS | Status: AC
  Administered 2017-10-31 (×2): 2 g via INTRAVENOUS
  Filled 2017-10-31 (×2): qty 100

## 2017-10-31 MED ORDER — MIDAZOLAM HCL 2 MG/2ML IJ SOLN
INTRAMUSCULAR | Status: AC
  Filled 2017-10-31: qty 2

## 2017-10-31 MED ORDER — METOCLOPRAMIDE HCL 5 MG PO TABS: 5.0000 mg | ORAL_TABLET | Freq: Three times a day (TID) | ORAL | Status: DC | PRN

## 2017-10-31 MED ORDER — POLYETHYLENE GLYCOL 3350 17 G PO PACK
17.0000 g | PACK | Freq: Every day | ORAL | Status: DC | PRN
  Filled 2017-10-31: qty 1

## 2017-10-31 MED ORDER — DEXAMETHASONE SODIUM PHOSPHATE 10 MG/ML IJ SOLN
10.0000 mg | Freq: Once | INTRAMUSCULAR | Status: AC
  Administered 2017-11-01: 10 mg via INTRAVENOUS
  Filled 2017-10-31: qty 1

## 2017-10-31 MED ORDER — MORPHINE SULFATE (PF) 2 MG/ML IV SOLN: 1.0000 mg | INTRAVENOUS | Status: DC | PRN

## 2017-10-31 MED ORDER — KETOROLAC TROMETHAMINE 30 MG/ML IJ SOLN: 30.0000 mg | Freq: Once | INTRAMUSCULAR | Status: DC | PRN

## 2017-10-31 MED ORDER — SODIUM CHLORIDE 0.9 % IV BOLUS (SEPSIS)
500.0000 mL | Freq: Once | INTRAVENOUS | Status: AC
  Administered 2017-10-31: 16:00:00 500 mL via INTRAVENOUS

## 2017-10-31 MED ORDER — ACETAMINOPHEN 650 MG RE SUPP: 650.0000 mg | RECTAL | Status: DC | PRN

## 2017-10-31 MED ORDER — SODIUM CHLORIDE 0.9 % IJ SOLN
INTRAMUSCULAR | Status: AC
  Filled 2017-10-31: qty 50

## 2017-10-31 MED ORDER — BUPIVACAINE IN DEXTROSE 0.75-8.25 % IT SOLN
INTRATHECAL | Status: DC | PRN
  Administered 2017-10-31: 1.8 mL via INTRATHECAL

## 2017-10-31 MED ORDER — MIDAZOLAM HCL 2 MG/2ML IJ SOLN
1.0000 mg | INTRAMUSCULAR | Status: AC
  Administered 2017-10-31: 2 mg via INTRAVENOUS

## 2017-10-31 MED ORDER — ACETAMINOPHEN 10 MG/ML IV SOLN
1000.0000 mg | Freq: Once | INTRAVENOUS | Status: AC
  Administered 2017-10-31: 1000 mg via INTRAVENOUS
  Filled 2017-10-31: qty 100

## 2017-10-31 MED ORDER — FLEET ENEMA 7-19 GM/118ML RE ENEM: 1.0000 | ENEMA | Freq: Once | RECTAL | Status: DC | PRN

## 2017-10-31 MED ORDER — TRANEXAMIC ACID 1000 MG/10ML IV SOLN
1000.0000 mg | Freq: Once | INTRAVENOUS | Status: AC
  Administered 2017-10-31: 12:00:00 1000 mg via INTRAVENOUS
  Filled 2017-10-31: qty 1100

## 2017-10-31 MED ORDER — SODIUM CHLORIDE 0.9 % IR SOLN
Status: DC | PRN
  Administered 2017-10-31: 1000 mL

## 2017-10-31 MED ORDER — ROPIVACAINE HCL 7.5 MG/ML IJ SOLN
INTRAMUSCULAR | Status: DC | PRN
  Administered 2017-10-31: 20 mL via PERINEURAL

## 2017-10-31 MED ORDER — BISACODYL 10 MG RE SUPP: 10.0000 mg | Freq: Every day | RECTAL | Status: DC | PRN

## 2017-10-31 MED ORDER — PHENOL 1.4 % MT LIQD: 1.0000 | OROMUCOSAL | Status: DC | PRN

## 2017-10-31 MED ORDER — PROPOFOL 500 MG/50ML IV EMUL
INTRAVENOUS | Status: DC | PRN
  Administered 2017-10-31: 65 ug/kg/min via INTRAVENOUS

## 2017-10-31 MED ORDER — ONDANSETRON HCL 4 MG/2ML IJ SOLN
INTRAMUSCULAR | Status: DC | PRN
  Administered 2017-10-31: 4 mg via INTRAVENOUS

## 2017-10-31 MED ORDER — DIPHENHYDRAMINE HCL 12.5 MG/5ML PO ELIX: 12.5000 mg | ORAL_SOLUTION | ORAL | Status: DC | PRN

## 2017-10-31 MED ORDER — GUAIFENESIN ER 600 MG PO TB12: 600.0000 mg | ORAL_TABLET | Freq: Every day | ORAL | Status: DC | PRN

## 2017-10-31 MED ORDER — ONDANSETRON HCL 4 MG/2ML IJ SOLN
INTRAMUSCULAR | Status: AC
  Filled 2017-10-31: qty 2

## 2017-10-31 MED ORDER — FENTANYL CITRATE (PF) 100 MCG/2ML IJ SOLN
INTRAMUSCULAR | Status: AC
  Filled 2017-10-31: qty 2

## 2017-10-31 MED ORDER — PHENYLEPHRINE 40 MCG/ML (10ML) SYRINGE FOR IV PUSH (FOR BLOOD PRESSURE SUPPORT)
PREFILLED_SYRINGE | INTRAVENOUS | Status: DC | PRN
  Administered 2017-10-31 (×6): 40 ug via INTRAVENOUS

## 2017-10-31 MED ORDER — DEXAMETHASONE SODIUM PHOSPHATE 10 MG/ML IJ SOLN
INTRAMUSCULAR | Status: AC
  Filled 2017-10-31: qty 1

## 2017-10-31 MED ORDER — HYDROMORPHONE HCL 1 MG/ML IJ SOLN
0.2500 mg | INTRAMUSCULAR | Status: DC | PRN
Start: 2017-10-31 — End: 2017-10-31

## 2017-10-31 MED ORDER — CEFAZOLIN SODIUM-DEXTROSE 2-4 GM/100ML-% IV SOLN
2.0000 g | INTRAVENOUS | Status: AC
  Administered 2017-10-31: 2 g via INTRAVENOUS
  Filled 2017-10-31: qty 100

## 2017-10-31 MED ORDER — LEVOCETIRIZINE DIHYDROCHLORIDE 2.5 MG/5ML PO SOLN: 2.5000 mg | Freq: Every day | ORAL | Status: DC | PRN

## 2017-10-31 MED ORDER — DOCUSATE SODIUM 100 MG PO CAPS
100.0000 mg | ORAL_CAPSULE | Freq: Two times a day (BID) | ORAL | Status: DC
  Administered 2017-10-31 – 2017-11-02 (×4): 100 mg via ORAL
  Filled 2017-10-31 (×4): qty 1

## 2017-10-31 MED ORDER — SODIUM CHLORIDE 0.9 % IJ SOLN
INTRAMUSCULAR | Status: AC
  Filled 2017-10-31: qty 10

## 2017-10-31 MED ORDER — PROPOFOL 10 MG/ML IV BOLUS
INTRAVENOUS | Status: AC
  Filled 2017-10-31: qty 60

## 2017-10-31 MED ORDER — LACTATED RINGERS IV SOLN
INTRAVENOUS | Status: DC | PRN
  Administered 2017-10-31 (×2): via INTRAVENOUS

## 2017-10-31 MED ORDER — MEPERIDINE HCL 50 MG/ML IJ SOLN: 6.2500 mg | INTRAMUSCULAR | Status: DC | PRN

## 2017-10-31 MED ORDER — PROMETHAZINE HCL 25 MG/ML IJ SOLN: 6.2500 mg | INTRAMUSCULAR | Status: DC | PRN

## 2017-10-31 MED ORDER — OXYCODONE HCL 5 MG PO TABS
10.0000 mg | ORAL_TABLET | ORAL | Status: DC | PRN
  Administered 2017-11-01 – 2017-11-02 (×8): 10 mg via ORAL
  Filled 2017-10-31 (×8): qty 2

## 2017-10-31 MED ORDER — METHOCARBAMOL 1000 MG/10ML IJ SOLN
500.0000 mg | Freq: Four times a day (QID) | INTRAMUSCULAR | Status: DC | PRN
  Filled 2017-10-31: qty 5

## 2017-10-31 MED ORDER — FENTANYL CITRATE (PF) 100 MCG/2ML IJ SOLN
50.0000 ug | INTRAMUSCULAR | Status: AC
  Administered 2017-10-31: 100 ug via INTRAVENOUS

## 2017-10-31 MED ORDER — FENTANYL CITRATE (PF) 100 MCG/2ML IJ SOLN
INTRAMUSCULAR | Status: AC
  Administered 2017-10-31: 100 ug via INTRAVENOUS
  Filled 2017-10-31: qty 2

## 2017-10-31 MED ORDER — DEXAMETHASONE SODIUM PHOSPHATE 10 MG/ML IJ SOLN
10.0000 mg | Freq: Once | INTRAMUSCULAR | Status: AC
  Administered 2017-10-31: 10 mg via INTRAVENOUS

## 2017-10-31 MED ORDER — ONDANSETRON HCL 4 MG/2ML IJ SOLN: 4.0000 mg | Freq: Four times a day (QID) | INTRAMUSCULAR | Status: DC | PRN

## 2017-10-31 MED ORDER — ONDANSETRON HCL 4 MG PO TABS: 4.0000 mg | ORAL_TABLET | Freq: Four times a day (QID) | ORAL | Status: DC | PRN

## 2017-10-31 MED ORDER — STERILE WATER FOR IRRIGATION IR SOLN
Status: DC | PRN
  Administered 2017-10-31: 2000 mL

## 2017-10-31 SURGICAL SUPPLY — 52 items
BAG SPEC THK2 15X12 ZIP CLS (MISCELLANEOUS) ×1
BAG ZIPLOCK 12X15 (MISCELLANEOUS) ×3 IMPLANT
BANDAGE ACE 6X5 VEL STRL LF (GAUZE/BANDAGES/DRESSINGS) ×3 IMPLANT
BLADE SAG 18X100X1.27 (BLADE) ×3 IMPLANT
BLADE SAW SGTL 11.0X1.19X90.0M (BLADE) ×3 IMPLANT
BOWL SMART MIX CTS (DISPOSABLE) ×3 IMPLANT
CAPT KNEE TOTAL 3 ATTUNE ×2 IMPLANT
CEMENT HV SMART SET (Cement) ×6 IMPLANT
CLOSURE WOUND 1/2 X4 (GAUZE/BANDAGES/DRESSINGS) ×1
COVER SURGICAL LIGHT HANDLE (MISCELLANEOUS) ×3 IMPLANT
CUFF TOURN SGL QUICK 34 (TOURNIQUET CUFF) ×3
CUFF TRNQT CYL 34X4X40X1 (TOURNIQUET CUFF) ×1 IMPLANT
DECANTER SPIKE VIAL GLASS SM (MISCELLANEOUS) ×3 IMPLANT
DRAPE U-SHAPE 47X51 STRL (DRAPES) ×3 IMPLANT
DRSG ADAPTIC 3X8 NADH LF (GAUZE/BANDAGES/DRESSINGS) ×3 IMPLANT
DRSG PAD ABDOMINAL 8X10 ST (GAUZE/BANDAGES/DRESSINGS) ×3 IMPLANT
DURAPREP 26ML APPLICATOR (WOUND CARE) ×3 IMPLANT
ELECT REM PT RETURN 15FT ADLT (MISCELLANEOUS) ×3 IMPLANT
EVACUATOR 1/8 PVC DRAIN (DRAIN) ×3 IMPLANT
GAUZE SPONGE 4X4 12PLY STRL (GAUZE/BANDAGES/DRESSINGS) ×3 IMPLANT
GLOVE BIO SURGEON STRL SZ7.5 (GLOVE) ×2 IMPLANT
GLOVE BIO SURGEON STRL SZ8 (GLOVE) ×3 IMPLANT
GLOVE BIOGEL PI IND STRL 6.5 (GLOVE) IMPLANT
GLOVE BIOGEL PI IND STRL 7.5 (GLOVE) IMPLANT
GLOVE BIOGEL PI IND STRL 8 (GLOVE) ×1 IMPLANT
GLOVE BIOGEL PI INDICATOR 6.5 (GLOVE)
GLOVE BIOGEL PI INDICATOR 7.5 (GLOVE) ×8
GLOVE BIOGEL PI INDICATOR 8 (GLOVE) ×2
GLOVE SURG SS PI 6.5 STRL IVOR (GLOVE) IMPLANT
GOWN STRL REUS W/TWL LRG LVL3 (GOWN DISPOSABLE) ×7 IMPLANT
GOWN STRL REUS W/TWL XL LVL3 (GOWN DISPOSABLE) ×2 IMPLANT
HANDPIECE INTERPULSE COAX TIP (DISPOSABLE) ×3
IMMOBILIZER KNEE 20 (SOFTGOODS) ×3
IMMOBILIZER KNEE 20 THIGH 36 (SOFTGOODS) ×1 IMPLANT
MANIFOLD NEPTUNE II (INSTRUMENTS) ×3 IMPLANT
NS IRRIG 1000ML POUR BTL (IV SOLUTION) ×3 IMPLANT
PACK TOTAL KNEE CUSTOM (KITS) ×3 IMPLANT
PAD ABD 8X10 STRL (GAUZE/BANDAGES/DRESSINGS) ×2 IMPLANT
PADDING CAST COTTON 6X4 STRL (CAST SUPPLIES) ×5 IMPLANT
POSITIONER SURGICAL ARM (MISCELLANEOUS) ×3 IMPLANT
SET HNDPC FAN SPRY TIP SCT (DISPOSABLE) ×1 IMPLANT
STRIP CLOSURE SKIN 1/2X4 (GAUZE/BANDAGES/DRESSINGS) ×3 IMPLANT
SUT MNCRL AB 4-0 PS2 18 (SUTURE) ×3 IMPLANT
SUT STRATAFIX 0 PDS 27 VIOLET (SUTURE) ×3
SUT VIC AB 2-0 CT1 27 (SUTURE) ×9
SUT VIC AB 2-0 CT1 TAPERPNT 27 (SUTURE) ×3 IMPLANT
SUTURE STRATFX 0 PDS 27 VIOLET (SUTURE) ×1 IMPLANT
SYR 30ML LL (SYRINGE) ×6 IMPLANT
TRAY FOLEY W/METER SILVER 16FR (SET/KITS/TRAYS/PACK) ×3 IMPLANT
WATER STERILE IRR 1000ML POUR (IV SOLUTION) ×6 IMPLANT
WRAP KNEE MAXI GEL POST OP (GAUZE/BANDAGES/DRESSINGS) ×3 IMPLANT
YANKAUER SUCT BULB TIP 10FT TU (MISCELLANEOUS) ×3 IMPLANT

## 2017-10-31 NOTE — Transfer of Care (Signed)
Immediate Anesthesia Transfer of Care Note  Patient: Paula Kelley  Procedure(s) Performed: RIGHT TOTAL KNEE ARTHROPLASTY (Right Knee)  Patient Location: PACU  Anesthesia Type:Spinal  Level of Consciousness: awake, alert  and oriented  Airway & Oxygen Therapy: Patient Spontanous Breathing and Patient connected to face mask oxygen  Post-op Assessment: Report given to RN  Post vital signs: Reviewed and stable  Last Vitals:  Vitals:   10/31/17 0800 10/31/17 0805  BP:  134/84  Pulse: 97 81  Resp: 18 16  Temp:    SpO2: 95% 100%    Last Pain:  Vitals:   10/31/17 0634  TempSrc: Oral         Complications: No apparent anesthesia complications

## 2017-10-31 NOTE — Progress Notes (Signed)
Up with therapy feeling faint, to chair b/p 67/28 HR 44,head down feet elevated. D Perkins notified with orders received D Anne NgFranklin Rn

## 2017-10-31 NOTE — Anesthesia Procedure Notes (Signed)
Anesthesia Regional Block: Adductor canal block   Pre-Anesthetic Checklist: ,, timeout performed, Correct Patient, Correct Site, Correct Laterality, Correct Procedure, Correct Position, site marked, Risks and benefits discussed,  Surgical consent,  Pre-op evaluation,  At surgeon's request and post-op pain management  Laterality: Right  Prep: chloraprep       Needles:  Injection technique: Single-shot  Needle Type: Stimiplex     Needle Length: 9cm  Needle Gauge: 21     Additional Needles:   Procedures:,,,, ultrasound used (permanent image in chart),,,,  Narrative:  Start time: 10/31/2017 7:58 AM End time: 10/31/2017 8:00 AM Injection made incrementally with aspirations every 5 mL.  Performed by: Personally  Anesthesiologist: Lewie LoronGermeroth, Ashtynn Berke, MD  Additional Notes: BP cuff, EKG monitors applied. Sedation begun. Artery and nerve location verified with U/S and anesthetic injected incrementally, slowly, and after negative aspirations under direct u/s guidance. Good fascial /perineural spread. Tolerated well.

## 2017-10-31 NOTE — Discharge Instructions (Addendum)
° °Dr. Frank Aluisio °Total Joint Specialist °[facility] °[address] °, [place] [address] °[phone] ° °TOTAL KNEE REPLACEMENT POSTOPERATIVE DIRECTIONS ° °Knee Rehabilitation, Guidelines Following Surgery  °Results after knee surgery are often greatly improved when you follow the exercise, range of motion and muscle strengthening exercises prescribed by your doctor. Safety measures are also important to protect the knee from further injury. Any time any of these exercises cause you to have increased pain or swelling in your knee joint, decrease the amount until you are comfortable again and slowly increase them. If you have problems or questions, call your caregiver or physical therapist for advice.  ° °HOME CARE INSTRUCTIONS  °Remove items at home which could result in a fall. This includes throw rugs or furniture in walking pathways.  °· ICE to the affected knee every three hours for 30 minutes at a time and then as needed for pain and swelling.  Continue to use ice on the knee for pain and swelling from surgery. You may notice swelling that will progress down to the foot and ankle.  This is normal after surgery.  Elevate the leg when you are not up walking on it.   °· Continue to use the breathing machine which will help keep your temperature down.  It is common for your temperature to cycle up and down following surgery, especially at night when you are not up moving around and exerting yourself.  The breathing machine keeps your lungs expanded and your temperature down. °· Do not place pillow under knee, focus on keeping the knee straight while resting ° °DIET °You may resume your previous home diet once your are discharged from the hospital. ° °DRESSING / WOUND CARE / SHOWERING °You may shower 3 days after surgery, but keep the wounds dry during showering.  You may use an occlusive plastic wrap (Press'n Seal for example), NO SOAKING/SUBMERGING IN THE BATHTUB.  If the  bandage gets wet, change with a clean dry gauze.  If the incision gets wet, pat the wound dry with a clean towel. °You may start showering once you are discharged home but do not submerge the incision under water. Just pat the incision dry and apply a dry gauze dressing on daily. °Change the surgical dressing daily and reapply a dry dressing each time. ° °ACTIVITY °Walk with your walker as instructed. °Use walker as long as suggested by your caregivers. °Avoid periods of inactivity such as sitting longer than an hour when not asleep. This helps prevent blood clots.  °You may resume a sexual relationship in one month or when given the OK by your doctor.  °You may return to work once you are cleared by your doctor.  °Do not drive a car for 6 weeks or until released by you surgeon.  °Do not drive while taking narcotics. ° °WEIGHT BEARING °Weight bearing as tolerated with assist device (walker, cane, etc) as directed, use it as long as suggested by your surgeon or therapist, typically at least 4-6 weeks. ° °POSTOPERATIVE CONSTIPATION PROTOCOL °Constipation - defined medically as fewer than three stools per week and severe constipation as less than one stool per week. ° °One of the most common issues patients have following surgery is constipation.  Even if you have a regular bowel pattern at home, your normal regimen is likely to be disrupted due to multiple reasons following surgery.  Combination of anesthesia, postoperative narcotics, change in appetite and fluid intake all can affect your bowels.    In order to avoid complications following surgery, here are some recommendations in order to help you during your recovery period. ° °Colace (docusate) - Pick up an over-the-counter form of Colace or another stool softener and take twice a day as long as you are requiring postoperative pain medications.  Take with a full glass of water daily.  If you experience loose stools or diarrhea, hold the colace until you stool forms  back up.  If your symptoms do not get better within 1 week or if they get worse, check with your doctor. ° °Dulcolax (bisacodyl) - Pick up over-the-counter and take as directed by the product packaging as needed to assist with the movement of your bowels.  Take with a full glass of water.  Use this product as needed if not relieved by Colace only.  ° °MiraLax (polyethylene glycol) - Pick up over-the-counter to have on hand.  MiraLax is a solution that will increase the amount of water in your bowels to assist with bowel movements.  Take as directed and can mix with a glass of water, juice, soda, coffee, or tea.  Take if you go more than two days without a movement. °Do not use MiraLax more than once per day. Call your doctor if you are still constipated or irregular after using this medication for 7 days in a row. ° °If you continue to have problems with postoperative constipation, please contact the office for further assistance and recommendations.  If you experience "the worst abdominal pain ever" or develop nausea or vomiting, please contact the office immediatly for further recommendations for treatment. ° °ITCHING ° If you experience itching with your medications, try taking only a single pain pill, or even half a pain pill at a time.  You can also use Benadryl over the counter for itching or also to help with sleep.  ° °TED HOSE STOCKINGS °Wear the elastic stockings on both legs for three weeks following surgery during the day but you may remove then at night for sleeping. ° °MEDICATIONS °See your medication summary on the “After Visit Summary” that the nursing staff will review with you prior to discharge.  You may have some home medications which will be placed on hold until you complete the course of blood thinner medication.  It is important for you to complete the blood thinner medication as prescribed by your surgeon.  Continue your approved medications as instructed at time of  discharge. ° °PRECAUTIONS °If you experience chest pain or shortness of breath - call 911 immediately for transfer to the hospital emergency department.  °If you develop a fever greater that 101 F, purulent drainage from wound, increased redness or drainage from wound, foul odor from the wound/dressing, or calf pain - CONTACT YOUR SURGEON.   °                                                °FOLLOW-UP APPOINTMENTS °Make sure you keep all of your appointments after your operation with your surgeon and caregivers. You should call the office at the above phone number and make an appointment for approximately two weeks after the date of your surgery or on the date instructed by your surgeon outlined in the "After Visit Summary". ° ° °RANGE OF MOTION AND STRENGTHENING EXERCISES  °Rehabilitation of the knee is important following a knee injury or   an operation. After just a few days of immobilization, the muscles of the thigh which control the knee become weakened and shrink (atrophy). Knee exercises are designed to build up the tone and strength of the thigh muscles and to improve knee motion. Often times heat used for twenty to thirty minutes before working out will loosen up your tissues and help with improving the range of motion but do not use heat for the first two weeks following surgery. These exercises can be done on a training (exercise) mat, on the floor, on a table or on a bed. Use what ever works the best and is most comfortable for you Knee exercises include:  °Leg Lifts - While your knee is still immobilized in a splint or cast, you can do straight leg raises. Lift the leg to 60 degrees, hold for 3 sec, and slowly lower the leg. Repeat 10-20 times 2-3 times daily. Perform this exercise against resistance later as your knee gets better.  °Quad and Hamstring Sets - Tighten up the muscle on the front of the thigh (Quad) and hold for 5-10 sec. Repeat this 10-20 times hourly. Hamstring sets are done by pushing the  foot backward against an object and holding for 5-10 sec. Repeat as with quad sets.  °· Leg Slides: Lying on your back, slowly slide your foot toward your buttocks, bending your knee up off the floor (only go as far as is comfortable). Then slowly slide your foot back down until your leg is flat on the floor again. °· Angel Wings: Lying on your back spread your legs to the side as far apart as you can without causing discomfort.  °A rehabilitation program following serious knee injuries can speed recovery and prevent re-injury in the future due to weakened muscles. Contact your doctor or a physical therapist for more information on knee rehabilitation.  ° °IF YOU ARE TRANSFERRED TO A SKILLED REHAB FACILITY °If the patient is transferred to a skilled rehab facility following release from the hospital, a list of the current medications will be sent to the facility for the patient to continue.  When discharged from the skilled rehab facility, please have the facility set up the patient's Home Health Physical Therapy prior to being released. Also, the skilled facility will be responsible for providing the patient with their medications at time of release from the facility to include their pain medication, the muscle relaxants, and their blood thinner medication. If the patient is still at the rehab facility at time of the two week follow up appointment, the skilled rehab facility will also need to assist the patient in arranging follow up appointment in our office and any transportation needs. ° °MAKE SURE YOU:  °Understand these instructions.  °Get help right away if you are not doing well or get worse.  ° ° °Pick up stool softner and laxative for home use following surgery while on pain medications. °Do not submerge incision under water. °Please use good hand washing techniques while changing dressing each day. °May shower starting three days after surgery. °Please use a clean towel to pat the incision dry following  showers. °Continue to use ice for pain and swelling after surgery. °Do not use any lotions or creams on the incision until instructed by your surgeon. ° °Take Xarelto for two and a half more weeks following discharge from the hospital, then discontinue Xarelto. °Once the patient has completed the Xarelto, they may resume the 81 mg Aspirin. ° ° ° °Information on   my medicine - XARELTO® (Rivaroxaban) ° °Why was Xarelto® prescribed for you? °Xarelto® was prescribed for you to reduce the risk of blood clots forming after orthopedic surgery. The medical term for these abnormal blood clots is venous thromboembolism (VTE). ° °What do you need to know about xarelto® ? °Take your Xarelto® ONCE DAILY at the same time every day. °You may take it either with or without food. ° °If you have difficulty swallowing the tablet whole, you may crush it and mix in applesauce just prior to taking your dose. ° °Take Xarelto® exactly as prescribed by your doctor and DO NOT stop taking Xarelto® without talking to the doctor who prescribed the medication.  Stopping without other VTE prevention medication to take the place of Xarelto® may increase your risk of developing a clot. ° °After discharge, you should have regular check-up appointments with your healthcare provider that is prescribing your Xarelto®.   ° °What do you do if you miss a dose? °If you miss a dose, take it as soon as you remember on the same day then continue your regularly scheduled once daily regimen the next day. Do not take two doses of Xarelto® on the same day.  ° °Important Safety Information °A possible side effect of Xarelto® is bleeding. You should call your healthcare provider right away if you experience any of the following: °? Bleeding from an injury or your nose that does not stop. °? Unusual colored urine (red or dark brown) or unusual colored stools (red or black). °? Unusual bruising for unknown reasons. °? A serious fall or if you hit your head (even if  there is no bleeding). ° °Some medicines may interact with Xarelto® and might increase your risk of bleeding while on Xarelto®. To help avoid this, consult your healthcare provider or pharmacist prior to using any new prescription or non-prescription medications, including herbals, vitamins, non-steroidal anti-inflammatory drugs (NSAIDs) and supplements. ° °This website has more information on Xarelto®: www.xarelto.com. ° ° ° °

## 2017-10-31 NOTE — Anesthesia Procedure Notes (Addendum)
Spinal  Patient location during procedure: OR Staffing Anesthesiologist: Nolon Nations, MD Performed: anesthesiologist  Preanesthetic Checklist Completed: patient identified, site marked, surgical consent, pre-op evaluation, timeout performed, IV checked, risks and benefits discussed and monitors and equipment checked Spinal Block Patient position: sitting Prep: DuraPrep Patient monitoring: heart rate, continuous pulse ox and blood pressure Approach: right paramedian Location: L3-4 Injection technique: single-shot Needle Needle type: Sprotte  Needle gauge: 24 G Needle length: 9 cm Assessment Sensory level: T8 Additional Notes Expiration date of kit checked and confirmed. Patient tolerated procedure well, without complications.

## 2017-10-31 NOTE — Evaluation (Signed)
Physical Therapy Evaluation Patient Details Name: Paula Kelley MRN: [id number] DOB: [birth year] Today's Date: 10/31/2017   History of Present Illness  Pt is a 67 year old female s/p R TKA 10/31/17 with hx of L TKA 11/01/16  Clinical Impression  Pt is s/p TKA resulting in the deficits listed below (see PT Problem List).  Pt will benefit from skilled PT to increase their independence and safety with mobility to allow discharge to the venue listed below. Pt assisted with ambulating short distance POD #0.  Upon returning to recliner, pt reported extreme fatigue and dizziness, vitals assessed (see mobility section below) and RN in to room to further assess.     Follow Up Recommendations Home health PT;DC plan and follow up therapy as arranged by surgeon    Equipment Recommendations  None recommended by PT    Recommendations for Other Services       Precautions / Restrictions Precautions Precautions: Fall;Knee Required Braces or Orthoses: Knee Immobilizer - Right Restrictions Other Position/Activity Restrictions: WBAT      Mobility  Bed Mobility Overal bed mobility: Needs Assistance Bed Mobility: Supine to Sit     Supine to sit: Min assist;HOB elevated     General bed mobility comments: assist for R LE  Transfers Overall transfer level: Needs assistance Equipment used: Rolling walker (2 wheeled) Transfers: Sit to/from Stand Sit to Stand: Min assist         General transfer comment: verbal cues for UE and LE positioning, assist to rise  Ambulation/Gait Ambulation/Gait assistance: Min guard Ambulation Distance (Feet): 80 Feet Assistive device: Rolling walker (2 wheeled) Gait Pattern/deviations: Step-to pattern;Decreased stance time - right;Antalgic     General Gait Details: verbal cues for RW positioning, step length, posture, pt reports fatigue, pt with HR 40 bpm upon returning to recliner then reported dizziness, BP 67/28 mmHg, SPO2 98% room air, RN called into room  to further assess  Stairs            Wheelchair Mobility    Modified Rankin (Stroke Patients Only)       Balance                                             Pertinent Vitals/Pain Pain Assessment: 0-10 Pain Score: 3  Pain Descriptors / Indicators: Burning;Aching Pain Intervention(s): Limited activity within patient's tolerance;Repositioned;Monitored during session    Home Living Family/patient expects to be discharged to:: Private residence Living Arrangements: Spouse/significant other Available Help at Discharge: Family Type of Home: House Home Access: Ramped entrance     Home Layout: Able to live on main level with bedroom/bathroom(has chair lift) Home Equipment: Walker - 2 wheels;Toilet riser      Prior Function Level of Independence: Independent               Hand Dominance        Extremity/Trunk Assessment        Lower Extremity Assessment Lower Extremity Assessment: RLE deficits/detail RLE Deficits / Details: unable to perform SLR, maintained KI, ROM TBA       Communication   Communication: No difficulties  Cognition Arousal/Alertness: Awake/alert Behavior During Therapy: Flat affect Overall Cognitive Status: Within Functional Limits for tasks assessed  General Comments: reports mainly fatigue      General Comments      Exercises     Assessment/Plan    PT Assessment Patient needs continued PT services  PT Problem List Decreased strength;Decreased mobility;Decreased knowledge of use of DME;Decreased knowledge of precautions;Pain       PT Treatment Interventions Functional mobility training;Gait training;Therapeutic exercise;DME instruction;Therapeutic activities;Patient/family education    PT Goals (Current goals can be found in the Care Plan section)  Acute Rehab PT Goals PT Goal Formulation: With patient/family Time For Goal Achievement: 11/05/17 Potential  to Achieve Goals: Good    Frequency 7X/week   Barriers to discharge        Co-evaluation               AM-PAC PT "6 Clicks" Daily Activity  Outcome Measure Difficulty turning over in bed (including adjusting bedclothes, sheets and blankets)?: A Little Difficulty moving from lying on back to sitting on the side of the bed? : Unable Difficulty sitting down on and standing up from a chair with arms (e.g., wheelchair, bedside commode, etc,.)?: Unable Help needed moving to and from a bed to chair (including a wheelchair)?: A Little Help needed walking in hospital room?: A Little Help needed climbing 3-5 steps with a railing? : A Lot 6 Click Score: 13    End of Session Equipment Utilized During Treatment: Gait belt;Right knee immobilizer Activity Tolerance: Treatment limited secondary to medical complications (Comment) Patient left: in chair;with chair alarm set;with call bell/phone within reach;with nursing/sitter in room Nurse Communication: Mobility status PT Visit Diagnosis: Other abnormalities of gait and mobility (R26.89)    Time: [id number] PT Time Calculation (min) (ACUTE ONLY): 21 min   Charges:   PT Evaluation $PT Eval Low Complexity: 1 Low     PT G CodesZenovia Jarred:       Kati Berthel Bagnall, PT, DPT 10/31/2017 Pager: [phone] Maida SaleLEMYRE,KATHrine E 10/31/2017, 5:15 PM

## 2017-10-31 NOTE — Progress Notes (Signed)
Assisted Dr. Germeroth with right, ultrasound guided, adductor canal block. Side rails up, monitors on throughout procedure. See vital signs in flow sheet. Tolerated Procedure well. 

## 2017-10-31 NOTE — Interval H&P Note (Signed)
History and Physical Interval Note:  10/31/2017 6:42 AM  Paula Kelley  has presented today for surgery, with the diagnosis of Right knee osteoarthritis  The various methods of treatment have been discussed with the patient and family. After consideration of risks, benefits and other options for treatment, the patient has consented to  Procedure(s) with comments: RIGHT TOTAL KNEE ARTHROPLASTY (Right) - 50 mins as a surgical intervention .  The patient's history has been reviewed, patient examined, no change in status, stable for surgery.  I have reviewed the patient's chart and labs.  Questions were answered to the patient's satisfaction.     Homero FellersFrank Mally Gavina

## 2017-10-31 NOTE — Op Note (Signed)
OPERATIVE REPORT-TOTAL KNEE ARTHROPLASTY   Pre-operative diagnosis- Osteoarthritis  Right knee(s)  Post-operative diagnosis- Osteoarthritis Right knee(s)  Procedure-  Right  Total Knee Arthroplasty  Surgeon- Gus Rankin. Shiya Fogelman, MD  Assistant- Avel Peace, PA-C   Anesthesia-  Adductor canal block and spinal  EBL-50 mL   Drains Hemovac  Tourniquet time-  Total Tourniquet Time Documented: Thigh (Right) - 34 minutes Total: Thigh (Right) - 34 minutes     Complications- None  Condition-PACU - hemodynamically stable.   Brief Clinical Note  Paula Kelley is a 67 y.o. year old female with end stage OA of her right knee with progressively worsening pain and dysfunction. She has constant pain, with activity and at rest and significant functional deficits with difficulties even with ADLs. She has had extensive non-op management including analgesics, injections of cortisone and viscosupplements, and home exercise program, but remains in significant pain with significant dysfunction.Radiographs show bone on bone arthritis lateral and patellofemoral. She presents now for right Total Knee Arthroplasty.    Procedure in detail---   The patient is brought into the operating room and positioned supine on the operating table. After successful administration of  Adductor canal block and spinal,   a tourniquet is placed high on the  Right thigh(s) and the lower extremity is prepped and draped in the usual sterile fashion. Time out is performed by the operating team and then the  Right lower extremity is wrapped in Esmarch, knee flexed and the tourniquet inflated to 300 mmHg.       A midline incision is made with a ten blade through the subcutaneous tissue to the level of the extensor mechanism. A fresh blade is used to make a medial parapatellar arthrotomy. Soft tissue over the proximal medial tibia is subperiosteally elevated to the joint line with a knife and into the semimembranosus bursa with a  Cobb elevator. Soft tissue over the proximal lateral tibia is elevated with attention being paid to avoiding the patellar tendon on the tibial tubercle. The patella is everted, knee flexed 90 degrees and the ACL and PCL are removed. Findings are bone on bone lateral ands patellofemoral with large global osteophytes.        The drill is used to create a starting hole in the distal femur and the canal is thoroughly irrigated with sterile saline to remove the fatty contents. The 5 degree Right  valgus alignment guide is placed into the femoral canal and the distal femoral cutting block is pinned to remove 9 mm off the distal femur. Resection is made with an oscillating saw.      The tibia is subluxed forward and the menisci are removed. The extramedullary alignment guide is placed referencing proximally at the medial aspect of the tibial tubercle and distally along the second metatarsal axis and tibial crest. The block is pinned to remove 2mm off the more deficient lateral  side. Resection is made with an oscillating saw. Size 5is the most appropriate size for the tibia and the proximal tibia is prepared with the modular drill and keel punch for that size.      The femoral sizing guide is placed and size 4 is most appropriate. Rotation is marked off the epicondylar axis and confirmed by creating a rectangular flexion gap at 90 degrees. The size 4 cutting block is pinned in this rotation and the anterior, posterior and chamfer cuts are made with the oscillating saw. The intercondylar block is then placed and that cut is made.  Trial size 5 tibial component, trial size 4 posterior stabilized femur and a 8  mm posterior stabilized rotating platform insert trial is placed. Full extension is achieved with excellent varus/valgus and anterior/posterior balance throughout full range of motion. The patella is everted and thickness measured to be 21  mm. Free hand resection is taken to 12 mm, a 35 template is placed, lug  holes are drilled, trial patella is placed, and it tracks normally. Osteophytes are removed off the posterior femur with the trial in place. All trials are removed and the cut bone surfaces prepared with pulsatile lavage. Cement is mixed and once ready for implantation, the size 5 tibial implant, size  4 posterior stabilized femoral component, and the size 35 patella are cemented in place and the patella is held with the clamp. The trial insert is placed and the knee held in full extension. The Exparel (20 ml mixed with 60 ml saline) is injected into the extensor mechanism, posterior capsule, medial and lateral gutters and subcutaneous tissues.  All extruded cement is removed and once the cement is hard the permanent 8 mm posterior stabilized rotating platform insert is placed into the tibial tray.      The wound is copiously irrigated with saline solution and the extensor mechanism closed over a hemovac drain with #1 V-loc suture. The tourniquet is released for a total tourniquet time of 34  minutes. Flexion against gravity is 140 degrees and the patella tracks normally. Subcutaneous tissue is closed with 2.0 vicryl and subcuticular with running 4.0 Monocryl. The incision is cleaned and dried and steri-strips and a bulky sterile dressing are applied. The limb is placed into a knee immobilizer and the patient is awakened and transported to recovery in stable condition.      Please note that a surgical assistant was a medical necessity for this procedure in order to perform it in a safe and expeditious manner. Surgical assistant was necessary to retract the ligaments and vital neurovascular structures to prevent injury to them and also necessary for proper positioning of the limb to allow for anatomic placement of the prosthesis.   Gus RankinFrank V. Oshua Mcconaha, MD    10/31/2017, 9:17 AM

## 2017-10-31 NOTE — Anesthesia Postprocedure Evaluation (Signed)
Anesthesia Post Note  Patient: Paula Kelley  Procedure(s) Performed: RIGHT TOTAL KNEE ARTHROPLASTY (Right Knee)     Patient location during evaluation: PACU Anesthesia Type: Spinal and MAC Level of consciousness: awake and alert Pain management: pain level controlled Vital Signs Assessment: post-procedure vital signs reviewed and stable Respiratory status: spontaneous breathing Cardiovascular status: stable Postop Assessment: spinal receding Anesthetic complications: no    Last Vitals:  Vitals:   10/31/17 1030 10/31/17 1045  BP: (!) 136/94 (!) 145/78  Pulse: 72 (!) 52  Resp: 15 12  Temp:  36.4 C  SpO2: 100% 100%    Last Pain:  Vitals:   10/31/17 1015  TempSrc:   PainSc: 0-No pain                 Nolon Nations

## 2017-10-31 NOTE — Plan of Care (Signed)
df

## 2017-11-01 ENCOUNTER — Encounter (HOSPITAL_COMMUNITY): Payer: Self-pay | Admitting: Orthopedic Surgery

## 2017-11-01 LAB — CBC
HCT: 30.5 % — ABNORMAL LOW (ref 36.0–46.0)
Hemoglobin: 9.9 g/dL — ABNORMAL LOW (ref 12.0–15.0)
MCH: 28.9 pg (ref 26.0–34.0)
MCHC: 32.5 g/dL (ref 30.0–36.0)
MCV: 88.9 fL (ref 78.0–100.0)
Platelets: 236 10*3/uL (ref 150–400)
RBC: 3.43 MIL/uL — AB (ref 3.87–5.11)
RDW: 13.7 % (ref 11.5–15.5)
WBC: 12.1 10*3/uL — ABNORMAL HIGH (ref 4.0–10.5)

## 2017-11-01 LAB — BASIC METABOLIC PANEL
Anion gap: 7 (ref 5–15)
BUN: 8 mg/dL (ref 6–20)
CO2: 27 mmol/L (ref 22–32)
CREATININE: 0.72 mg/dL (ref 0.44–1.00)
Calcium: 8.9 mg/dL (ref 8.9–10.3)
Chloride: 105 mmol/L (ref 101–111)
GFR calc Af Amer: >60 mL/min (ref >60)
GLUCOSE: 108 mg/dL — AB (ref 65–99)
POTASSIUM: 3.9 mmol/L (ref 3.5–5.1)
Sodium: 139 mmol/L (ref 135–145)

## 2017-11-01 MED ORDER — OXYCODONE HCL 5 MG PO TABS: 5.0000 mg | ORAL_TABLET | ORAL | 0 refills | Status: DC | PRN

## 2017-11-01 MED ORDER — METHOCARBAMOL 500 MG PO TABS: 500.0000 mg | ORAL_TABLET | Freq: Four times a day (QID) | ORAL | 0 refills | Status: DC | PRN

## 2017-11-01 MED ORDER — RIVAROXABAN 10 MG PO TABS: 10.0000 mg | ORAL_TABLET | Freq: Every day | ORAL | 0 refills | Status: DC

## 2017-11-01 NOTE — Progress Notes (Signed)
   Subjective: 1 Day Post-Op Procedure(s) (LRB): RIGHT TOTAL KNEE ARTHROPLASTY (Right) Patient reports pain as mild and moderate.   Patient seen in rounds for Dr. Lequita HaltAluisio. Patient is having problems with pain in the knee, requiring pain medications  Also issues with low blood pressure yesterday and again this morning.  Given fluids yesterday and additional bolus today. We will resume therapy today. Asking for stronger pain meds but continued issues with pressure. Plan is to go Home after hospital stay.  Objective: Vital signs in last 24 hours: Temp:  [98 F (36.7 C)-98.9 F (37.2 C)] 98.9 F (37.2 C) (01/15 1327) Pulse Rate:  [51-90] 90 (01/15 1327) Resp:  [14-17] 16 (01/15 1327) BP: (67-144)/(38-82) 138/76 (01/15 1327) SpO2:  [96 %-100 %] 100 % (01/15 1327)  Intake/Output from previous day:  Intake/Output Summary (Last 24 hours) at 11/01/2017 1556 Last data filed at 11/01/2017 1500 Gross per 24 hour  Intake 3655 ml  Output 3075 ml  Net 580 ml    Intake/Output this shift: Total I/O In: 1505 [P.O.:600; I.V.:905] Out: 600 [Urine:600]  Labs: Recent Labs    11/01/17 0533  HGB 9.9*   Recent Labs    11/01/17 0533  WBC 12.1*  RBC 3.43*  HCT 30.5*  PLT 236   Recent Labs    11/01/17 0533  NA 139  K 3.9  CL 105  CO2 27  BUN 8  CREATININE 0.72  GLUCOSE 108*  CALCIUM 8.9   No results for input(s): LABPT, INR in the last 72 hours.  EXAM General - Patient is Alert, Appropriate and Oriented Extremity - Neurovascular intact Sensation intact distally Intact pulses distally Dorsiflexion/Plantar flexion intact Dressing - dressing C/D/I Motor Function - intact, moving foot and toes well on exam.  Hemovac pulled without difficulty.  Past Medical History:  Diagnosis Date  . Allergic rhinitis   . Anxiety   . DJD (degenerative joint disease)    Sees Dr. Dierdre ForthBeekman  . Headache(784.0)   . Hypertension   . Mild anemia    iron def, reports on iron her whole life  .  Obesity   . URI (upper respiratory infection)   . UTI (lower urinary tract infection)     Assessment/Plan: 1 Day Post-Op Procedure(s) (LRB): RIGHT TOTAL KNEE ARTHROPLASTY (Right) Principal Problem:   OA (osteoarthritis) of knee  Estimated body mass index is 30.55 kg/m as calculated from the following:   Height as of this encounter: 5\' 4"  (1.626 m).   Weight as of this encounter: 80.7 kg (178 lb). Advance diet Up with therapy Plan for discharge tomorrow  DVT Prophylaxis - Xarelto Weight-Bearing as tolerated to right leg D/C O2 and Pulse OX and try on Room Air  Avel Peacerew Perkins, PA-C Orthopaedic Surgery 11/01/2017, 3:56 PM

## 2017-11-01 NOTE — Evaluation (Signed)
Occupational Therapy Evaluation Patient Details Name: Paula Kelley MRN: [id number] DOB: July 17, 1951 Today's Date: 11/01/2017    History of Present Illness Pt is a 67 year old female s/p R TKA 10/31/17 with hx of L TKA 11/01/16   Clinical Impression   Pt is s/p TKA resulting in the deficits listed below (see OT Problem List).  Pt will benefit from skilled OT to increase their safety and independence with ADL and functional mobility for ADL to facilitate discharge to venue listed below.        Follow Up Recommendations  No OT follow up    Equipment Recommendations  None recommended by OT       Precautions / Restrictions Precautions Precautions: Fall;Knee Required Braces or Orthoses: Knee Immobilizer - Right Restrictions Other Position/Activity Restrictions: WBAT      Mobility Bed Mobility Overal bed mobility: Needs Assistance Bed Mobility: Supine to Sit     Supine to sit: Min assist;HOB elevated        Transfers Overall transfer level: Needs assistance Equipment used: Rolling walker (2 wheeled) Transfers: Sit to/from [employer] Sit to Stand: Min assist Stand pivot transfers: Min assist            Balance Overall balance assessment: No apparent balance deficits (not formally assessed)                                         ADL either performed or assessed with clinical judgement   ADL Overall ADL's : Needs assistance/impaired Eating/Feeding: Set up;Sitting   Grooming: Set up;Sitting   Upper Body Bathing: Set up;Sitting   Lower Body Bathing: Moderate assistance;Sit to/from stand;Cueing for sequencing;Cueing for compensatory techniques   Upper Body Dressing : Set up;Sitting   Lower Body Dressing: Moderate assistance;Sit to/from stand;Cueing for compensatory techniques;Cueing for sequencing   Toilet Transfer: Minimal assistance;Cueing for safety;BSC;Stand-pivot;RW   Toileting- Clothing Manipulation and Hygiene: Minimal  assistance;Cueing for safety;Cueing for sequencing               Vision Patient Visual Report: No change from baseline       Perception     Praxis      Pertinent Vitals/Pain Pain Score: 4  Pain Descriptors / Indicators: Burning;Aching Pain Intervention(s): Limited activity within patient's tolerance     Hand Dominance     Extremity/Trunk Assessment Upper Extremity Assessment Upper Extremity Assessment: Generalized weakness           Communication Communication Communication: No difficulties   Cognition Arousal/Alertness: Awake/alert Behavior During Therapy: Flat affect;WFL for tasks assessed/performed Overall Cognitive Status: Within Functional Limits for tasks assessed                                     General Comments       Exercises     Shoulder Instructions      Home Living Family/patient expects to be discharged to:: Private residence Living Arrangements: Spouse/significant other Available Help at Discharge: Family Type of Home: House Home Access: Ramped entrance     Home Layout: Able to live on main level with bedroom/bathroom(has chair lift)     Bathroom Shower/Tub: Producer, television/film/video: Standard     Home Equipment: Environmental consultant - 2 wheels;Toilet riser          Prior Functioning/Environment Level  of Independence: Independent                 OT Problem List: Decreased strength;Decreased knowledge of use of DME or AE      OT Treatment/Interventions: Self-care/ADL training;DME and/or AE instruction;Patient/family education    OT Goals(Current goals can be found in the care plan section) Acute Rehab OT Goals Patient Stated Goal: be able to walk and bake a cake OT Goal Formulation: With patient Time For Goal Achievement: 11/15/17  OT Frequency: Min 2X/week   Barriers to D/C:            Co-evaluation              AM-PAC PT "6 Clicks" Daily Activity     Outcome Measure Help from another  person eating meals?: None Help from another person taking care of personal grooming?: None Help from another person toileting, which includes using toliet, bedpan, or urinal?: A Little Help from another person bathing (including washing, rinsing, drying)?: A Little Help from another person to put on and taking off regular upper body clothing?: None Help from another person to put on and taking off regular lower body clothing?: A Little 6 Click Score: 21   End of Session Equipment Utilized During Treatment: Rolling walker Nurse Communication: Mobility status  Activity Tolerance: Patient tolerated treatment well Patient left: in chair  OT Visit Diagnosis: Unsteadiness on feet (R26.81)                Time: [id number] OT Time Calculation (min): 20 min Charges:  OT General Charges $OT Visit: 1 Visit OT Evaluation $OT Eval Low Complexity: 1 Low G-Codes:     Lise AuerLori Jerod Mcquain, OT [phone]  Einar CrowEDDING, Rembert Browe D 11/01/2017, 10:55 AM

## 2017-11-01 NOTE — Progress Notes (Signed)
Made Michaelene Songrew Perkins,PA aware of patients blood pressure of 88/46 and patient being dizzy and symptomatic. 250mL bolus ordered for 51200mL/hr. Recheck of blood pressure 121/62. Patient no longer dizzy at this time. Will continue to monitor.

## 2017-11-01 NOTE — Progress Notes (Signed)
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  Pt ambulated in hallway and reports no dizziness today.  Pt also able to perform LE exercises in recliner.  Follow Up Recommendations  Home health PT;DC plan and follow up therapy as arranged by surgeon     Equipment Recommendations  None recommended by PT    Recommendations for Other Services       Precautions / Restrictions Precautions Precautions: Fall;Knee Required Braces or Orthoses: Knee Immobilizer - Right Restrictions Other Position/Activity Restrictions: WBAT    Mobility  Bed Mobility Overal bed mobility: Needs Assistance Bed Mobility: Supine to Sit     Supine to sit: Min assist;HOB elevated     General bed mobility comments: pt up in recliner on arrival  Transfers Overall transfer level: Needs assistance Equipment used: Rolling walker (2 wheeled) Transfers: Sit to/from Stand Sit to Stand: Min guard Stand pivot transfers: Min assist       General transfer comment: verbal cues for UE and LE positioning  Ambulation/Gait Ambulation/Gait assistance: Min guard Ambulation Distance (Feet): 80 Feet Assistive device: Rolling walker (2 wheeled) Gait Pattern/deviations: Step-to pattern;Decreased stance time - right;Antalgic     General Gait Details: verbal cues for RW positioning, step length, posture, slow but safe pace today; BP 122/64 mmHg HR 67 bpm upon returning to recliner, no dizziness today   Stairs            Wheelchair Mobility    Modified Rankin (Stroke Patients Only)       Balance Overall balance assessment: No apparent balance deficits (not formally assessed)                                          Cognition Arousal/Alertness:  Awake/alert Behavior During Therapy: WFL for tasks assessed/performed Overall Cognitive Status: Within Functional Limits for tasks assessed                                 General Comments: more alert and talkative then yesterday      Exercises Total Joint Exercises Ankle Circles/Pumps: AROM;Both;10 reps Quad Sets: AROM;Right;10 reps Short Arc QuadBarbaraann Kelley: AAROM;Right;10 reps Heel Slides: AAROM;10 reps;Right Straight Leg Raises: AAROM;10 reps;Right Goniometric ROM: approx 55* AAROM R knee flexion    General Comments        Pertinent Vitals/Pain Pain Assessment: 0-10 Pain Score: 3  Pain Location: R knee Pain Descriptors / Indicators: Burning;Aching Pain Intervention(s): Repositioned;Limited activity within patient's tolerance;Monitored during session;Ice applied    Home Living Family/patient expects to be discharged to:: Private residence Living Arrangements: Spouse/significant other Available Help at Discharge: Family Type of Home: House Home Access: Ramped entrance   Home Layout: Able to live on main level with bedroom/bathroom(has chair lift) Home Equipment: Walker - 2 wheels;Toilet riser      Prior Function Level of Independence: Independent          PT Goals (current goals can now be found in the care plan section) Acute Rehab PT Goals Patient Stated Goal: be able to walk and bake a cake Progress towards PT goals: Progressing  toward goals    Frequency    7X/week      PT Plan Current plan remains appropriate    Co-evaluation              AM-PAC PT "6 Clicks" Daily Activity  Outcome Measure  Difficulty turning over in bed (including adjusting bedclothes, sheets and blankets)?: A Little Difficulty moving from lying on back to sitting on the side of the bed? : A Lot Difficulty sitting down on and standing up from a chair with arms (Kelley.g., wheelchair, bedside commode, etc,.)?: A Lot Help needed moving to and from a bed to chair (including a  wheelchair)?: A Little Help needed walking in hospital room?: A Little Help needed climbing 3-5 steps with a railing? : A Lot 6 Click Score: 15    End of Session Equipment Utilized During Treatment: Gait belt;Right knee immobilizer Activity Tolerance: Patient tolerated treatment well Patient left: in chair;with call bell/phone within reach;with family/visitor present   PT Visit Diagnosis: Other abnormalities of gait and mobility (R26.89)     Time: 1610-9604 PT Time Calculation (min) (ACUTE ONLY): 23 min  Charges:  $Gait Training: 8-22 mins $Therapeutic Exercise: 8-22 mins                    G Codes:      Paula Kelley, PT, DPT 11/01/2017 Pager: [phone]  Paula Kelley 11/01/2017, 11:45 AM

## 2017-11-01 NOTE — Discharge Summary (Signed)
Physician Discharge Summary   Patient ID: Paula Kelley MRN: [id number] DOB/AGE: 1951/10/08 67 y.o.  Admit date: 10/31/2017 Discharge date: 11-02-2017  Primary Diagnosis:  Osteoarthritis Right knee(s)   Admission Diagnoses:  Past Medical History:  Diagnosis Date  . Allergic rhinitis   . Anxiety   . DJD (degenerative joint disease)    Sees Dr. Amil Amen  . Headache(784.0)   . Hypertension   . Mild anemia    iron def, reports on iron her whole life  . Obesity   . URI (upper respiratory infection)   . UTI (lower urinary tract infection)    Discharge Diagnoses:   Principal Problem:   OA (osteoarthritis) of knee  Estimated body mass index is 30.55 kg/m as calculated from the following:   Height as of this encounter: _0  (1.626 m).   Weight as of this encounter: 80.7 kg (178 lb).  Procedure:  Procedure(s) (LRB): RIGHT TOTAL KNEE ARTHROPLASTY (Right)   Consults: None  HPI: Paula Kelley is a 67 y.o. year old female with end stage OA of her right knee with progressively worsening pain and dysfunction. She has constant pain, with activity and at rest and significant functional deficits with difficulties even with ADLs. She has had extensive non-op management including analgesics, injections of cortisone and viscosupplements, and home exercise program, but remains in significant pain with significant dysfunction.Radiographs show bone on bone arthritis lateral and patellofemoral. She presents now for right Total Knee Arthroplasty.     Laboratory Data: Admission on 10/31/2017  Component Date Value Ref Range Status  . WBC 11/01/2017 12.1* 4.0 - 10.5 K/uL Final  . RBC 11/01/2017 3.43* 3.87 - 5.11 MIL/uL Final  . Hemoglobin 11/01/2017 9.9* 12.0 - 15.0 g/dL Final  . HCT 11/01/2017 30.5* 36.0 - 46.0 % Final  . MCV 11/01/2017 88.9  78.0 - 100.0 fL Final  . MCH 11/01/2017 28.9  26.0 - 34.0 pg Final  . MCHC 11/01/2017 32.5  30.0 - 36.0 g/dL Final  . RDW 11/01/2017 13.7  11.5 - 15.5 %  Final  . Platelets 11/01/2017 236  150 - 400 K/uL Final  . Sodium 11/01/2017 139  135 - 145 mmol/L Final  . Potassium 11/01/2017 3.9  3.5 - 5.1 mmol/L Final  . Chloride 11/01/2017 105  101 - 111 mmol/L Final  . CO2 11/01/2017 27  22 - 32 mmol/L Final  . Glucose, Bld 11/01/2017 108* 65 - 99 mg/dL Final  . BUN 11/01/2017 8  6 - 20 mg/dL Final  . Creatinine, Ser 11/01/2017 0.72  0.44 - 1.00 mg/dL Final  . Calcium 11/01/2017 8.9  8.9 - 10.3 mg/dL Final  . GFR calc non Af Amer 11/01/2017 >60  >60 mL/min Final  . GFR calc Af Amer 11/01/2017 >60  >60 mL/min Final   Comment: (NOTE) The eGFR has been calculated using the CKD EPI equation. This calculation has not been validated in all clinical situations. eGFR's persistently <60 mL/min signify possible Chronic Kidney Disease.   Georgiann Hahn gap 11/01/2017 7  5 - 15 Final  Hospital Outpatient Visit on 10/27/2017  Component Date Value Ref Range Status  . aPTT 10/27/2017 28  24 - 36 seconds Final  . WBC 10/27/2017 6.2  4.0 - 10.5 K/uL Final  . RBC 10/27/2017 4.00  3.87 - 5.11 MIL/uL Final  . Hemoglobin 10/27/2017 11.4* 12.0 - 15.0 g/dL Final  . HCT 10/27/2017 35.6* 36.0 - 46.0 % Final  . MCV 10/27/2017 89.0  78.0 - 100.0 fL Final  .  MCH 10/27/2017 28.5  26.0 - 34.0 pg Final  . MCHC 10/27/2017 32.0  30.0 - 36.0 g/dL Final  . RDW 10/27/2017 13.6  11.5 - 15.5 % Final  . Platelets 10/27/2017 280  150 - 400 K/uL Final  . Sodium 10/27/2017 138  135 - 145 mmol/L Final  . Potassium 10/27/2017 3.8  3.5 - 5.1 mmol/L Final  . Chloride 10/27/2017 104  101 - 111 mmol/L Final  . CO2 10/27/2017 30  22 - 32 mmol/L Final  . Glucose, Bld 10/27/2017 91  65 - 99 mg/dL Final  . BUN 10/27/2017 10  6 - 20 mg/dL Final  . Creatinine, Ser 10/27/2017 0.70  0.44 - 1.00 mg/dL Final  . Calcium 10/27/2017 9.5  8.9 - 10.3 mg/dL Final  . Total Protein 10/27/2017 8.0  6.5 - 8.1 g/dL Final  . Albumin 10/27/2017 4.2  3.5 - 5.0 g/dL Final  . AST 10/27/2017 19  15 - 41 U/L Final    . ALT 10/27/2017 14  14 - 54 U/L Final  . Alkaline Phosphatase 10/27/2017 75  38 - 126 U/L Final  . Total Bilirubin 10/27/2017 0.8  0.3 - 1.2 mg/dL Final  . GFR calc non Af Amer 10/27/2017 >60  >60 mL/min Final  . GFR calc Af Amer 10/27/2017 >60  >60 mL/min Final   Comment: (NOTE) The eGFR has been calculated using the CKD EPI equation. This calculation has not been validated in all clinical situations. eGFR's persistently <60 mL/min signify possible Chronic Kidney Disease.   . Anion gap 10/27/2017 4* 5 - 15 Final  . Prothrombin Time 10/27/2017 12.5  11.4 - 15.2 seconds Final  . INR 10/27/2017 0.94   Final  . ABO/RH(D) 10/27/2017 B POS   Final  . Antibody Screen 10/27/2017 NEG   Final  . Sample Expiration 10/27/2017 11/03/2017   Final  . Extend sample reason 10/27/2017 NO TRANSFUSIONS OR PREGNANCY IN THE PAST 3 MONTHS   Final  . MRSA, PCR 10/27/2017 INVALID RESULTS, SPECIMEN SENT FOR CULTURE* NEGATIVE Final  . Staphylococcus aureus 10/27/2017 INVALID RESULTS, SPECIMEN SENT FOR CULTURE* NEGATIVE Final   Comment: (NOTE) The Xpert SA Assay (FDA approved for NASAL specimens in patients 49 years of age and older), is one component of a comprehensive surveillance program. It is not intended to diagnose infection nor to guide or monitor treatment.   [name] Specimen Description 10/27/2017 NOSE   Final  . Special Requests 10/27/2017 NONE   Final  . Culture 10/27/2017    Final                   Value:NO MRSA DETECTED Performed at [facility], [address], [place], [place] [address]   . Report Status 10/27/2017 10/29/2017 FINAL   Final     X-Rays:No results found.  EKG: Orders placed or performed during the hospital encounter of 10/27/17  . EKG 12 lead  . EKG 12 lead     Hospital Course: Paula Kelley is a 67 y.o. who was admitted to [facility]. They were brought to the operating room on 10/31/2017 and underwent Procedure(s): RIGHT TOTAL KNEE ARTHROPLASTY.  Patient  tolerated the procedure well and was later transferred to the recovery room and then to the orthopaedic floor for postoperative care.  They were given PO and IV analgesics for pain control following their surgery.  They were given 24 hours of postoperative antibiotics of  Anti-infectives (From admission, onward)   Start  Dose/Rate Route Frequency Ordered Stop   10/31/17 1400  ceFAZolin (ANCEF) IVPB 2g/100 mL premix     2 g 200 mL/hr over 30 Minutes Intravenous Every 6 hours 10/31/17 1128 10/31/17 2002   10/31/17 0649  ceFAZolin (ANCEF) 2-4 GM/100ML-% IVPB    Comments:  Key, Kristopher   : cabinet override      10/31/17 0649 10/31/17 0816   10/31/17 0628  ceFAZolin (ANCEF) IVPB 2g/100 mL premix     2 g 200 mL/hr over 30 Minutes Intravenous On call to O.R. 10/31/17 4656 10/31/17 0816     and started on DVT prophylaxis in the form of Xarelto.   PT and OT were ordered for total joint protocol.  Discharge planning consulted to help with postop disposition and equipment needs.  Patient had a tough night on the evening of surgery with pain.  They started to get up OOB with therapy on day one. Hemovac drain was pulled without difficulty.  Continued to work with therapy into day two.  Dressing was changed on day two and the incision was healing well. Patient was seen in rounds on day two and was ready to go home.  Diet - Cardiac diet Follow up - in 2 weeks Activity - WBAT Disposition - Home Condition Upon Discharge - Stable D/C Meds - See DC Summary DVT Prophylaxis - Xarelto  Discharge Instructions    Call MD / Call 911   Complete by:  As directed    If you experience chest pain or shortness of breath, CALL 911 and be transported to the hospital emergency room.  If you develope a fever above 101 F, pus (white drainage) or increased drainage or redness at the wound, or calf pain, call your surgeon's office.   Change dressing   Complete by:  As directed    Change dressing daily with sterile 4 x  4 inch gauze dressing and apply TED hose. Do not submerge the incision under water.   Constipation Prevention   Complete by:  As directed    Drink plenty of fluids.  Prune juice may be helpful.  You may use a stool softener, such as Colace (over the counter) 100 mg twice a day.  Use MiraLax (over the counter) for constipation as needed.   Diet - low sodium heart healthy   Complete by:  As directed    Discharge instructions   Complete by:  As directed    Take Xarelto for two and a half more weeks, then discontinue Xarelto. Once the patient has completed the Xarelto, they may resume the 81 mg Aspirin.   Pick up stool softner and laxative for home use following surgery while on pain medications. Do not submerge incision under water. Please use good hand washing techniques while changing dressing each day. May shower starting three days after surgery. Please use a clean towel to pat the incision dry following showers. Continue to use ice for pain and swelling after surgery. Do not use any lotions or creams on the incision until instructed by your surgeon.  Wear both TED hose on both legs during the day every day for three weeks, but may remove the TED hose at night at home.  Postoperative Constipation Protocol  Constipation - defined medically as fewer than three stools per week and severe constipation as less than one stool per week.  One of the most common issues patients have following surgery is constipation.  Even if you have a regular bowel pattern at home, your  normal regimen is likely to be disrupted due to multiple reasons following surgery.  Combination of anesthesia, postoperative narcotics, change in appetite and fluid intake all can affect your bowels.  In order to avoid complications following surgery, here are some recommendations in order to help you during your recovery period.  Colace (docusate) - Pick up an over-the-counter form of Colace or another stool softener and take  twice a day as long as you are requiring postoperative pain medications.  Take with a full glass of water daily.  If you experience loose stools or diarrhea, hold the colace until you stool forms back up.  If your symptoms do not get better within 1 week or if they get worse, check with your doctor.  Dulcolax (bisacodyl) - Pick up over-the-counter and take as directed by the product packaging as needed to assist with the movement of your bowels.  Take with a full glass of water.  Use this product as needed if not relieved by Colace only.   MiraLax (polyethylene glycol) - Pick up over-the-counter to have on hand.  MiraLax is a solution that will increase the amount of water in your bowels to assist with bowel movements.  Take as directed and can mix with a glass of water, juice, soda, coffee, or tea.  Take if you go more than two days without a movement. Do not use MiraLax more than once per day. Call your doctor if you are still constipated or irregular after using this medication for 7 days in a row.  If you continue to have problems with postoperative constipation, please contact the office for further assistance and recommendations.  If you experience "the worst abdominal pain ever" or develop nausea or vomiting, please contact the office immediatly for further recommendations for treatment.   Do not put a pillow under the knee. Place it under the heel.   Complete by:  As directed    Do not sit on low chairs, stoools or toilet seats, as it may be difficult to get up from low surfaces   Complete by:  As directed    Driving restrictions   Complete by:  As directed    No driving until released by the physician.   Increase activity slowly as tolerated   Complete by:  As directed    Lifting restrictions   Complete by:  As directed    No lifting until released by the physician.   Patient may shower   Complete by:  As directed    You may shower without a dressing once there is no drainage.  Do not  wash over the wound.  If drainage remains, do not shower until drainage stops.   TED hose   Complete by:  As directed    Use stockings (TED hose) for 3 weeks on both leg(s).  You may remove them at night for sleeping.   Weight bearing as tolerated   Complete by:  As directed    Laterality:  right   Extremity:  Lower     Allergies as of 11/01/2017      Reactions   Sulfonamide Derivatives    REACTION: unsure of reaction---this was as a child      Medication List    STOP taking these medications   aspirin 81 MG EC tablet   CALCIUM 600 + D PO   ibuprofen 200 MG tablet Commonly known as:  ADVIL,MOTRIN   VITAMIN D PO   ZINC-VITAMIN C PO  TAKE these medications   amLODipine-benazepril 5-20 MG capsule Commonly known as:  LOTREL Take 1 capsule daily by mouth.   ferrous sulfate 325 (65 FE) MG tablet Take 325 mg by mouth daily with breakfast.   fluticasone 50 MCG/ACT nasal spray Commonly known as:  FLONASE Place 2 sprays daily as needed into both nostrils (for allergies/sinus issues). What changed:  reasons to take this   guaiFENesin 600 MG 12 hr tablet Commonly known as:  MUCINEX TAKE 1-2 TABLETS BY MOUTH TWICE DAILY AS NEEDED What changed:    how much to take  how to take this  when to take this  reasons to take this  additional instructions   levocetirizine 2.5 MG/5ML solution Commonly known as:  XYZAL Take 5 mLs (2.5 mg total) daily as needed by mouth (for allergies/sinus issues).   methocarbamol 500 MG tablet Commonly known as:  ROBAXIN Take 1 tablet (500 mg total) by mouth every 6 (six) hours as needed for muscle spasms.   oxyCODONE 5 MG immediate release tablet Commonly known as:  Oxy IR/ROXICODONE Take 1-2 tablets (5-10 mg total) by mouth every 4 (four) hours as needed for moderate pain or severe pain.   Potassium 99 MG Tabs Take 99 mg by mouth daily.   psyllium 58.6 % powder Commonly known as:  METAMUCIL Take 1 packet by mouth daily at 2  PM.   rivaroxaban 10 MG Tabs tablet Commonly known as:  XARELTO Take 1 tablet (10 mg total) by mouth daily with breakfast. Take Xarelto for two and a half more weeks following discharge from the hospital, then discontinue Xarelto. Once the patient has completed the Xarelto, they may resume the 81 mg Aspirin. Start taking on:  11/02/2017            Discharge Care Instructions  (From admission, onward)        Start     Ordered   11/01/17 0000  Weight bearing as tolerated    Question Answer Comment  Laterality right   Extremity Lower      11/01/17 2108   11/01/17 0000  Change dressing    Comments:  Change dressing daily with sterile 4 x 4 inch gauze dressing and apply TED hose. Do not submerge the incision under water.   11/01/17 2108     Follow-up Information    [facility]   Specialty:  [place] Why:  physical therapy Contact information: [place] [facility] [address] [phone]        Gaynelle Arabian, MD. Schedule an appointment as soon as possible for a visit on 11/15/2017.   Specialty:  Orthopedic Surgery Contact information: [address] [place] [address] [phone]           Signed: Arlee Muslim, PA-C Orthopaedic Surgery 11/01/2017, 9:09 PM

## 2017-11-01 NOTE — Progress Notes (Signed)
Physical Therapy Treatment Patient Details Name: Paula Kelley MRN: [id number] DOB: 1950/10/22 Today's Date: 11/01/2017    History of Present Illness Pt is a 67 year old female s/p R TKA 10/31/17 with hx of L TKA 11/01/16    PT Comments    Pt ambulated in hallway again this afternoon and no dizziness reported.   Pt assisted back to bed.   Follow Up Recommendations  Home health PT;DC plan and follow up therapy as arranged by surgeon     Equipment Recommendations  None recommended by PT    Recommendations for Other Services       Precautions / Restrictions Precautions Precautions: Fall;Knee Required Braces or Orthoses: Knee Immobilizer - Right Restrictions Other Position/Activity Restrictions: WBAT    Mobility  Bed Mobility Overal bed mobility: Needs Assistance Bed Mobility: Sit to Supine       Sit to supine: Min assist;HOB elevated   General bed mobility comments: assist for R LE  Transfers Overall transfer level: Needs assistance Equipment used: Rolling walker (2 wheeled) Transfers: Sit to/from Stand Sit to Stand: Min guard         General transfer comment: verbal cues for UE and LE positioning  Ambulation/Gait Ambulation/Gait assistance: Min guard Ambulation Distance (Feet): 100 Feet Assistive device: Rolling walker (2 wheeled) Gait Pattern/deviations: Step-to pattern;Decreased stance time - right;Antalgic     General Gait Details: verbal cues for RW positioning, step length, posture, slow but safe pace today; no dizziness this afternoon   Stairs            Wheelchair Mobility    Modified Rankin (Stroke Patients Only)       Balance                                            Cognition Arousal/Alertness: Awake/alert Behavior During Therapy: WFL for tasks assessed/performed Overall Cognitive Status: Within Functional Limits for tasks assessed                                 General Comments: more alert  and talkative then yesterday      Exercises     General Comments        Pertinent Vitals/Pain Pain Assessment: 0-10 Pain Score: 3  Pain Location: R knee Pain Descriptors / Indicators: Burning;Aching Pain Intervention(s): Limited activity within patient's tolerance;Repositioned;Monitored during session    Home Living                      Prior Function            PT Goals (current goals can now be found in the care plan section) Progress towards PT goals: Progressing toward goals    Frequency    7X/week      PT Plan Current plan remains appropriate    Co-evaluation              AM-PAC PT "6 Clicks" Daily Activity  Outcome Measure  Difficulty turning over in bed (including adjusting bedclothes, sheets and blankets)?: A Little Difficulty moving from lying on back to sitting on the side of the bed? : Unable Difficulty sitting down on and standing up from a chair with arms (e.g., wheelchair, bedside commode, etc,.)?: A Lot Help needed moving to and from a bed to chair (including a  wheelchair)?: A Little Help needed walking in hospital room?: A Little Help needed climbing 3-5 steps with a railing? : A Lot 6 Click Score: 14    End of Session Equipment Utilized During Treatment: Gait belt;Right knee immobilizer Activity Tolerance: Patient tolerated treatment well Patient left: with call bell/phone within reach;in bed;with family/visitor present   PT Visit Diagnosis: Other abnormalities of gait and mobility (R26.89)     Time: 1610-9604 PT Time Calculation (min) (ACUTE ONLY): 15 min  Charges:  $Gait Training: 8-22 mins                     G Codes:      Zenovia Jarred, PT, DPT 11/01/2017 Pager: [phone]  [name] 11/01/2017, 3:08 PM

## 2017-11-01 NOTE — Progress Notes (Signed)
Discharge planning, spoke with patient and spouse at bedside. Have chosen [facility] for HH PT, evaluate and treat. Contacted [facility] for referral. Has RW and 3n1. [phone] 

## 2017-11-02 LAB — BASIC METABOLIC PANEL
Anion gap: 6 (ref 5–15)
BUN: 9 mg/dL (ref 6–20)
CHLORIDE: 102 mmol/L (ref 101–111)
CO2: 29 mmol/L (ref 22–32)
CREATININE: 0.64 mg/dL (ref 0.44–1.00)
Calcium: 8.9 mg/dL (ref 8.9–10.3)
GFR calc non Af Amer: >60 mL/min (ref >60)
GLUCOSE: 104 mg/dL — AB (ref 65–99)
Potassium: 3.3 mmol/L — ABNORMAL LOW (ref 3.5–5.1)
Sodium: 137 mmol/L (ref 135–145)

## 2017-11-02 LAB — CBC
HEMATOCRIT: 28.5 % — AB (ref 36.0–46.0)
HEMOGLOBIN: 9.2 g/dL — AB (ref 12.0–15.0)
MCH: 28.6 pg (ref 26.0–34.0)
MCHC: 32.3 g/dL (ref 30.0–36.0)
MCV: 88.5 fL (ref 78.0–100.0)
Platelets: 230 10*3/uL (ref 150–400)
RBC: 3.22 MIL/uL — ABNORMAL LOW (ref 3.87–5.11)
RDW: 13.7 % (ref 11.5–15.5)
WBC: 14.3 10*3/uL — ABNORMAL HIGH (ref 4.0–10.5)

## 2017-11-02 MED ORDER — POLYSACCHARIDE IRON COMPLEX 150 MG PO CAPS
150.0000 mg | ORAL_CAPSULE | Freq: Two times a day (BID) | ORAL | Status: DC
  Administered 2017-11-02: 08:00:00 150 mg via ORAL
  Filled 2017-11-02: qty 1

## 2017-11-02 MED ORDER — POTASSIUM CHLORIDE CRYS ER 20 MEQ PO TBCR
40.0000 meq | EXTENDED_RELEASE_TABLET | ORAL | Status: AC
  Administered 2017-11-02 (×2): 40 meq via ORAL
  Filled 2017-11-02 (×2): qty 2

## 2017-11-02 NOTE — Progress Notes (Signed)
Physical Therapy Treatment Patient Details Name: Paula Kelley MRN: [id number] DOB: 10-02-1951 Today's Date: 11/02/2017    History of Present Illness Pt is a 67 year old female s/p R TKA 10/31/17 with hx of L TKA 11/01/16    PT Comments    Pt performed LE exercises while reviewing HEP handout.  Pt also ambulated in hallway.  Pt moving slowly but able to perform without physical assist.  Pt plans to d/c home today.  Reviewed KI and when to wear/remove.  Pt had no further questions.   Follow Up Recommendations  Home health PT;DC plan and follow up therapy as arranged by surgeon     Equipment Recommendations  None recommended by PT    Recommendations for Other Services       Precautions / Restrictions Precautions Precautions: Fall;Knee Required Braces or Orthoses: Knee Immobilizer - Right Restrictions Other Position/Activity Restrictions: WBAT    Mobility  Bed Mobility Overal bed mobility: Needs Assistance Bed Mobility: Supine to Sit;Sit to Supine     Supine to sit: Min guard Sit to supine: Min guard   General bed mobility comments: increased time for pt to self assist  Transfers Overall transfer level: Needs assistance Equipment used: Rolling walker (2 wheeled) Transfers: Sit to/from Stand Sit to Stand: Min guard         General transfer comment: verbal cues for UE and LE positioning and increased time  Ambulation/Gait Ambulation/Gait assistance: Min guard Ambulation Distance (Feet): 100 Feet Assistive device: Rolling walker (2 wheeled) Gait Pattern/deviations: Step-to pattern;Decreased stance time - right;Antalgic Gait velocity: decr   General Gait Details: verbal cues for RW positioning, step length, posture, slow pace   Stairs            Wheelchair Mobility    Modified Rankin (Stroke Patients Only)       Balance Overall balance assessment: No apparent balance deficits (not formally assessed)                                          Cognition Arousal/Alertness: Awake/alert Behavior During Therapy: WFL for tasks assessed/performed Overall Cognitive Status: Within Functional Limits for tasks assessed                                        Exercises Total Joint Exercises Ankle Circles/Pumps: AROM;Both;10 reps Quad Sets: AROM;Right;10 reps Short Arc QuadBarbaraann Boys: AAROM;Right;10 reps Heel Slides: AAROM;10 reps;Right Hip ABduction/ADduction: Right;AAROM;10 reps Straight Leg Raises: AAROM;10 reps;Right    General Comments        Pertinent Vitals/Pain Pain Assessment: 0-10 Pain Score: 3  Pain Location: R knee Pain Descriptors / Indicators: Aching;Sore Pain Intervention(s): Limited activity within patient's tolerance;Monitored during session;Repositioned;Ice applied    Home Living                      Prior Function            PT Goals (current goals can now be found in the care plan section) Progress towards PT goals: Progressing toward goals    Frequency    7X/week      PT Plan Current plan remains appropriate    Co-evaluation              AM-PAC PT "6 Clicks" Daily Activity  Outcome Measure  Difficulty turning over in bed (including adjusting bedclothes, sheets and blankets)?: A Little Difficulty moving from lying on back to sitting on the side of the bed? : A Lot Difficulty sitting down on and standing up from a chair with arms (e.g., wheelchair, bedside commode, etc,.)?: A Lot Help needed moving to and from a bed to chair (including a wheelchair)?: A Little Help needed walking in hospital room?: A Little Help needed climbing 3-5 steps with a railing? : A Lot 6 Click Score: 15    End of Session Equipment Utilized During Treatment: Right knee immobilizer Activity Tolerance: Patient tolerated treatment well Patient left: with call bell/phone within reach;in bed;with family/visitor present   PT Visit Diagnosis: Other abnormalities of gait and mobility  (R26.89)     Time: 1610-9604 PT Time Calculation (min) (ACUTE ONLY): 27 min  Charges:  $Gait Training: 8-22 mins $Therapeutic Exercise: 8-22 mins                    G Codes:       Zenovia Jarred, PT, DPT 11/02/2017 Pager: [phone]  [name] 11/02/2017, 12:16 PM

## 2017-11-02 NOTE — Progress Notes (Signed)
Occupational Therapy Treatment Patient Details Name: Paula Kelley MRN: [id number] DOB: 08/02/1951 Today's Date: 11/02/2017    History of present illness Pt is a 67 year old female s/p R TKA 10/31/17 with hx of L TKA 11/01/16   OT comments  Husband will A as needed  Follow Up Recommendations  No OT follow up    Equipment Recommendations  None recommended by OT    Recommendations for Other Services      Precautions / Restrictions Precautions Precautions: Fall;Knee Required Braces or Orthoses: Knee Immobilizer - Right Restrictions Other Position/Activity Restrictions: WBAT       Mobility Bed Mobility Overal bed mobility: Needs Assistance Bed Mobility: Sit to Supine     Supine to sit: Min assist     General bed mobility comments: increased time neeeeded  Transfers Overall transfer level: Needs assistance Equipment used: Rolling walker (2 wheeled) Transfers: Sit to/from Stand Sit to Stand: Min guard         General transfer comment: verbal cues for UE and LE positioning and increased time    Balance Overall balance assessment: No apparent balance deficits (not formally assessed)                                         ADL either performed or assessed with clinical judgement   ADL Overall ADL's : Needs assistance/impaired Eating/Feeding: Set up;Sitting   Grooming: Standing;Supervision/safety       Lower Body Bathing: Minimal assistance;Cueing for sequencing;Cueing for compensatory techniques;With caregiver independent assisting;Cueing for safety       Lower Body Dressing: Sit to/from stand;Minimal assistance;With caregiver independent assisting;Cueing for sequencing;Cueing for safety       Toileting- Clothing Manipulation and Hygiene: Supervision/safety;Sit to/from stand;Cueing for safety;Cueing for sequencing     Tub/Shower Transfer Details (indicate cue type and reason): verbalized safety- pt plans to sponge bath Functional  mobility during ADLs: Min guard;Caregiver able to provide necessary level of assistance;Rolling walker;Supervision/safety;Cueing for safety;Cueing for sequencing                 Cognition Arousal/Alertness: Awake/alert Behavior During Therapy: WFL for tasks assessed/performed Overall Cognitive Status: Within Functional Limits for tasks assessed                                                     Pertinent Vitals/ Pain       Pain Score: 5  Pain Descriptors / Indicators: Aching;Sore Pain Intervention(s): Limited activity within patient's tolerance;Monitored during session;Repositioned;Ice applied     Prior Functioning/Environment              Frequency  Min 2X/week        Progress Toward Goals  OT Goals(current goals can now be found in the care plan section)  Progress towards OT goals: Progressing toward goals     Plan Discharge plan remains appropriate       AM-PAC PT "6 Clicks" Daily Activity     Outcome Measure   Help from another person eating meals?: None Help from another person taking care of personal grooming?: None Help from another person toileting, which includes using toliet, bedpan, or urinal?: A Little Help from another person bathing (including washing, rinsing, drying)?: A Little Help from another person  to put on and taking off regular upper body clothing?: None Help from another person to put on and taking off regular lower body clothing?: A Little 6 Click Score: 21    End of Session Equipment Utilized During Treatment: Rolling walker  OT Visit Diagnosis: Unsteadiness on feet (R26.81)   Activity Tolerance Patient tolerated treatment well   Patient Left in chair   Nurse Communication Mobility status        Time: [id number] OT Time Calculation (min): 12 min  Charges: OT General Charges $OT Visit: 1 Visit OT Treatments $Self Care/Home Management : 8-22 mins  LapelLori Dellar [place], [place] [phone]   Einar CrowEDDING,  Bryson Palen D 11/02/2017, 10:15 AM

## 2017-11-02 NOTE — Progress Notes (Signed)
   Subjective: 2 Days Post-Op Procedure(s) (LRB): RIGHT TOTAL KNEE ARTHROPLASTY (Right) Patient reports pain as mild.   Patient seen in rounds with Dr. Lequita HaltAluisio. Patient is well, and has had no acute complaints or problems Patient is ready to go home  Objective: Vital signs in last 24 hours: Temp:  [98.6 F (37 C)-98.9 F (37.2 C)] 98.6 F (37 C) (01/16 0521) Pulse Rate:  [60-90] 87 (01/16 0521) Resp:  [15-18] 15 (01/16 0521) BP: (88-149)/(46-85) 149/85 (01/16 0521) SpO2:  [96 %-100 %] 100 % (01/16 0521)  Intake/Output from previous day:  Intake/Output Summary (Last 24 hours) at 11/02/2017 0819 Last data filed at 11/02/2017 0521 Gross per 24 hour  Intake 1745 ml  Output 1200 ml  Net 545 ml    Intake/Output this shift: No intake/output data recorded.  Labs: Recent Labs    11/01/17 0533 11/02/17 0521  HGB 9.9* 9.2*   Recent Labs    11/01/17 0533 11/02/17 0521  WBC 12.1* 14.3*  RBC 3.43* 3.22*  HCT 30.5* 28.5*  PLT 236 230   Recent Labs    11/01/17 0533 11/02/17 0521  NA 139 137  K 3.9 3.3*  CL 105 102  CO2 27 29  BUN 8 9  CREATININE 0.72 0.64  GLUCOSE 108* 104*  CALCIUM 8.9 8.9   No results for input(s): LABPT, INR in the last 72 hours.  EXAM: General - Patient is Alert, Appropriate and Oriented Extremity - Neurovascular intact Sensation intact distally Intact pulses distally Incision - clean, dry Motor Function - intact, moving foot and toes well on exam.   Assessment/Plan: 2 Days Post-Op Procedure(s) (LRB): RIGHT TOTAL KNEE ARTHROPLASTY (Right) Procedure(s) (LRB): RIGHT TOTAL KNEE ARTHROPLASTY (Right) Past Medical History:  Diagnosis Date  . Allergic rhinitis   . Anxiety   . DJD (degenerative joint disease)    Sees Dr. Dierdre ForthBeekman  . Headache(784.0)   . Hypertension   . Mild anemia    iron def, reports on iron her whole life  . Obesity   . URI (upper respiratory infection)   . UTI (lower urinary tract infection)    Principal  Problem:   OA (osteoarthritis) of knee  Estimated body mass index is 30.55 kg/m as calculated from the following:   Height as of this encounter: 5\' 4"  (1.626 m).   Weight as of this encounter: 80.7 kg (178 lb). Up with therapy Diet - Cardiac diet Follow up - in 2 weeks Activity - WBAT Disposition - Home Condition Upon Discharge - Stable D/C Meds - See DC Summary DVT Prophylaxis - Xarelto  Avel Peacerew Mare Ludtke, PA-C Orthopaedic Surgery 11/02/2017, 8:19 AM

## 2017-11-21 ENCOUNTER — Encounter: Payer: Self-pay | Admitting: Physical Therapy

## 2017-11-21 ENCOUNTER — Ambulatory Visit: Payer: BC Managed Care – PPO | Attending: Orthopedic Surgery | Admitting: Physical Therapy

## 2017-11-21 ENCOUNTER — Other Ambulatory Visit: Payer: Self-pay

## 2017-11-21 DIAGNOSIS — M25662 Stiffness of left knee, not elsewhere classified: Secondary | ICD-10-CM | POA: Insufficient documentation

## 2017-11-21 DIAGNOSIS — M6281 Muscle weakness (generalized): Secondary | ICD-10-CM | POA: Diagnosis present

## 2017-11-21 DIAGNOSIS — M25661 Stiffness of right knee, not elsewhere classified: Secondary | ICD-10-CM | POA: Diagnosis present

## 2017-11-21 DIAGNOSIS — M25561 Pain in right knee: Secondary | ICD-10-CM | POA: Diagnosis not present

## 2017-11-21 DIAGNOSIS — G8929 Other chronic pain: Secondary | ICD-10-CM | POA: Diagnosis present

## 2017-11-21 NOTE — Therapy (Signed)
[facility] [address], [address] [place], [place], [address] Phone: [phone]   Fax:  [phone]  Physical Therapy Evaluation  Patient Details  Name: Paula Kelley MRN: [id number] Date of Birth: 11/26/1950 Referring Provider: Ollen Gross   Encounter Date: 11/21/2017  PT End of Session - 11/21/17 1141    Visit Number  1    Date for PT Re-Evaluation  01/02/18    PT Start Time  1055    PT Stop Time  1138    PT Time Calculation (min)  43 min    Activity Tolerance  Patient tolerated treatment well    Behavior During Therapy  [facility] for tasks assessed/performed       Past Medical History:  Diagnosis Date  . Allergic rhinitis   . Anxiety   . DJD (degenerative joint disease)    Sees Dr. Dierdre Forth  . Headache(784.0)   . Hypertension   . Mild anemia    iron def, reports on iron her whole life  . Obesity   . URI (upper respiratory infection)   . UTI (lower urinary tract infection)     Past Surgical History:  Procedure Laterality Date  . COLONOSCOPY    . svd     2 live births  . 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 TOTAL KNEE ARTHROPLASTY Right 10/31/2017   Procedure: RIGHT TOTAL KNEE ARTHROPLASTY;  Surgeon: Ollen Gross, MD;  Location: [facility];  Service: Orthopedics;  Laterality: Right;  50 mins    There were no vitals filed for this visit.   Subjective Assessment - 11/21/17 1106    Subjective  Pt had Rt TKA    Pertinent History  Rt TKA 10/31/17; history of Lt TKA last year    Limitations  Walking    Patient Stated Goals  be able to walk the campus or walk at the gym for 45 minutes    Currently in Pain?  Yes    Pain Score  2     Pain Location  Knee    Pain Orientation  Right    Pain Descriptors / Indicators  Tightness    Pain Type  Surgical pain    Pain Onset   More than a month ago    Pain Frequency  Intermittent    Aggravating Factors   morning, walking and standing    Pain Relieving Factors  muscle relaxer help sleep, ice    Multiple Pain Sites  No         [facility] PT Assessment - 11/21/17 0001      Assessment   Medical Diagnosis  Z96.651 Presence of right artificial knee joint    Referring Provider  Homero Fellers Aluisio    Onset Date/Surgical Date  10/31/17    Prior Therapy  Yes home health PT      Precautions   Precautions  None      Restrictions   Weight Bearing Restrictions  No      Balance Screen   Has the patient fallen in the past 6 months  No      Home Environment   Living Environment  Private residence    Living Arrangements  Spouse/significant other      Prior Function   Level of Independence  Independent    Vocation  Full time employment    Vocation  Requirements  sitting/walking/computer    Leisure  exercise and walking      Cognition   Overall Cognitive Status  Within Functional Limits for tasks assessed      Observation/Other Assessments   Focus on Therapeutic Outcomes (FOTO)   52% limited      Posture/Postural Control   Posture/Postural Control  Postural limitations    Postural Limitations  Anterior pelvic tilt;Flexed trunk;Weight shift left      ROM / Strength   AROM / PROM / Strength  AROM;Strength      AROM   Overall AROM   Deficits    AROM Assessment Site  Knee    Right/Left Knee  Right;Left    Right Knee Extension  15    Right Knee Flexion  98    Left Knee Extension  0      Strength   Strength Assessment Site  Knee;Hip    Right/Left Hip  Right;Left    Right Hip Flexion  4/5    Right Hip External Rotation   4-/5    Right Hip Internal Rotation  4-/5    Right Hip ABduction  3+/5    Right Hip ADduction  4-/5    Left Hip Flexion  4+/5    Left Hip External Rotation  4+/5    Left Hip Internal Rotation  4+/5    Left Hip ABduction  4+/5    Left Hip ADduction  4+/5    Right/Left Knee  Right;Left     Right Knee Flexion  4-/5    Right Knee Extension  4-/5    Left Knee Flexion  5/5    Left Knee Extension  5/5      Palpation   Palpation comment  edema of right knee; boggy end feel of patellar glides; incision well scabbed over      Transfers   Five time sit to stand comments   22 sec; needs UE support      Ambulation/Gait   Ambulation/Gait  Yes    Ambulation/Gait Assistance  7: Independent    Ambulation Distance (Feet)  20 Feet    Assistive device  Straight cane was carrying cane; did not use during assessment    Gait Pattern  Antalgic right antalgic    Gait Comments  needed cues for heel strike and prevent leaning over to the right; patient demonstrates improved gait with verbal cues             Objective measurements completed on examination: See above findings.      [facility] Adult PT Treatment/Exercise - 11/21/17 0001      Knee/Hip Exercises: Aerobic   Stationary Bike  L0 x 3 min for ROM educated on HEP               PT Short Term Goals - 11/21/17 1220      PT SHORT TERM GOAL #1   Title  independent with inital HEP    Time  4    Period  Weeks    Status  New    Target Date  12/19/17      PT SHORT TERM GOAL #2   Title  demonstrates even gait with LRAD    Time  4    Period  Weeks    Status  New    Target Date  12/19/17      PT SHORT TERM GOAL #3   Title  able to perform 5x sit to stand test without using UE support due  to increased LE strength    Time  4    Period  Weeks    Status  New    Target Date  12/19/17        PT Long Term Goals - 11/21/17 1221      PT LONG TERM GOAL #1   Title  independent with advanced HEP    Time  6    Period  Weeks    Status  New    Target Date  01/02/18      PT LONG TERM GOAL #2   Title  FOTO < or = to 37% limitation    Time  6    Period  Days    Status  New    Target Date  01/02/18      PT LONG TERM GOAL #3   Title  improved knee extension to 0 degreee, and flexion to 120 degrees for improved gait     Time  8    Period  Weeks    Status  New    Target Date  01/02/18      PT LONG TERM GOAL #4   Title  improved 5 x sit to stand to < or = to 12 seconds to demonstrate improved strength and reduced risk of falls    Time  6    Period  Weeks    Status  New    Target Date  01/02/18      PT LONG TERM GOAL #5   Title  ind with advanced HEP and able to return to walking 20 minutes/day for return to walking for exercise    Time  6    Period  Weeks    Status  New    Target Date  01/02/18             Plan - 11/21/17 1210    Clinical Impression Statement  Patient presents to clinic due to recent TKA of Rt LE.  She is currently having pain and difficulty walking and sleeping due to pain and stiffness in the knee.  Patient presents with well healing incision that is scabbed over and no observable redness in surrounding tissues.  Pt has swelling of right knee with some bogginess of petallar mobility.  Pt has decreased ROM from 15-98 of right knee. Pt demonstrates weakness bilaterally Right >left.  She is occasionally using straight cane when she becomes fatigued.  Pt has gait and postural deficits as described above.  She will benefit from skilled PT to address impairments and return to full functional activities so she can return to her full time job and to walking as part of healthy lifestyle    History and Personal Factors relevant to plan of care:  Rt TKA; Lt TKA; motivated    Clinical Presentation  Stable    Clinical Presentation due to:  pt is stable    Clinical Decision Making  Low    Rehab Potential  Excellent    PT Frequency  2x / week    PT Duration  6 weeks    PT Treatment/Interventions  ADLs/Self Care Home Management;Biofeedback;Cryotherapy;Electrical Stimulation;Moist Heat;Ultrasound;Gait training;Stair training;Functional mobility training;Therapeutic activities;Therapeutic exercise;Balance training;Neuromuscular re-education;Patient/family education;Manual techniques;Manual lymph  drainage;Scar mobilization;Passive range of motion;Dry needling;Taping;Vasopneumatic Device    PT Next Visit Plan  meausrement of knee circumference, right knee ROM; bike, vasopneumatic, progress strengthening as tolerated    Consulted and Agree with Plan of Care  Patient       Patient will benefit from  skilled therapeutic intervention in order to improve the following deficits and impairments:  Abnormal gait, Decreased activity tolerance, Decreased endurance, Pain, Postural dysfunction, Decreased strength, Decreased range of motion, Difficulty walking  Visit Diagnosis: Chronic pain of right knee - Plan: PT plan of care cert/re-cert  Stiffness of right knee, not elsewhere classified - Plan: PT plan of care cert/re-cert  Muscle weakness (generalized) - Plan: PT plan of care cert/re-cert     Problem List Patient Active Problem List   Diagnosis Date Noted  . Anemia 11/01/2008  . Allergic rhinitis 11/01/2008  . Essential hypertension 09/03/2008  . OA (osteoarthritis) of knee 09/03/2008    Vincente Poli, PT 11/21/2017, 12:29 PM  [facility] [address], [address] [place], [place], [address] Phone: [phone]   Fax:  [phone]  Name: Paula Kelley MRN: [id number] Date of Birth: 03-15-1951

## 2017-11-23 ENCOUNTER — Ambulatory Visit: Payer: BC Managed Care – PPO | Admitting: Physical Therapy

## 2017-11-23 ENCOUNTER — Encounter: Payer: Self-pay | Admitting: Physical Therapy

## 2017-11-23 DIAGNOSIS — M25661 Stiffness of right knee, not elsewhere classified: Secondary | ICD-10-CM

## 2017-11-23 DIAGNOSIS — M25561 Pain in right knee: Principal | ICD-10-CM

## 2017-11-23 DIAGNOSIS — M6281 Muscle weakness (generalized): Secondary | ICD-10-CM

## 2017-11-23 DIAGNOSIS — M25662 Stiffness of left knee, not elsewhere classified: Secondary | ICD-10-CM

## 2017-11-23 DIAGNOSIS — G8929 Other chronic pain: Secondary | ICD-10-CM

## 2017-11-23 NOTE — Therapy (Signed)
[facility] [address], [address] [place], [place], [address] Phone: [phone]   Fax:  [phone]  Physical Therapy Treatment  Patient Details  Name: Paula Kelley MRN: [id number] Date of Birth: Mar 09, 1951 Referring Provider: Ollen Gross   Encounter Date: 11/23/2017  PT End of Session - 11/23/17 1105    Visit Number  2    Date for PT Re-Evaluation  01/02/18    PT Start Time  1057    PT Stop Time  1145    PT Time Calculation (min)  48 min    Activity Tolerance  Patient tolerated treatment well    Behavior During Therapy  [facility] for tasks assessed/performed       Past Medical History:  Diagnosis Date  . Allergic rhinitis   . Anxiety   . DJD (degenerative joint disease)    Sees Dr. Dierdre Forth  . Headache(784.0)   . Hypertension   . Mild anemia    iron def, reports on iron her whole life  . Obesity   . URI (upper respiratory infection)   . UTI (lower urinary tract infection)     Past Surgical History:  Procedure Laterality Date  . COLONOSCOPY    . svd     2 live births  . 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 TOTAL KNEE ARTHROPLASTY Right 10/31/2017   Procedure: RIGHT TOTAL KNEE ARTHROPLASTY;  Surgeon: Ollen Gross, MD;  Location: [facility];  Service: Orthopedics;  Laterality: Right;  50 mins    There were no vitals filed for this visit.  Subjective Assessment - 11/23/17 1102    Subjective  Pt reports she is just really stiff in the morning.  Pt denies pain and hasn't taken any med this morning, just a little ice.  Pt states she just takes muscle relaxer at night to rest, but doesn't need it all the time.    Pertinent History  Rt TKA 10/31/17; history of Lt TKA last year    Patient Stated Goals  be able to walk the campus or walk at the gym for 45 minutes     Currently in Pain?  No/denies         [facility] PT Assessment - 11/23/17 0001      Observation/Other Assessments-Edema    Edema  Circumferential Rt knee around joint line 41cm                  [facility] Adult PT Treatment/Exercise - 11/23/17 0001      Knee/Hip Exercises: Stretches   Knee: Self-Stretch to increase Flexion  Right;5 reps;10 seconds in supine with strap      Knee/Hip Exercises: Aerobic   Stationary Bike  L0 x 7 min for ROM PT present for status update      Knee/Hip Exercises: Standing   Rebounder   3 ways - 1 min each way no UE as tolerated      Knee/Hip Exercises: Seated   Long Arc Quad  Strengthening;Right;2 sets;10 reps add 1.5# ankle weight    Hamstring Curl  Strengthening;Right;20 reps    Hamstring Limitations  green band      Knee/Hip Exercises: Supine   Quad Sets  Strengthening;Right;5 reps 10 sec hold    Bridges  Strengthening;Both;20 reps      Modalities   Modalities  Cryotherapy;Vasopneumatic  Cryotherapy   Number Minutes Cryotherapy  15 Minutes    Cryotherapy Location  Knee left; during vaso on right      Vasopneumatic   Number Minutes Vasopneumatic   15 minutes    Vasopnuematic Location   Knee    Vasopneumatic Pressure  High bifurcated    Vasopneumatic Temperature   3 snow flakes               PT Short Term Goals - 11/23/17 1105      PT SHORT TERM GOAL #1   Title  independent with inital HEP    Time  4    Period  Weeks    Status  On-going      PT SHORT TERM GOAL #2   Title  demonstrates even gait with LRAD    Time  4    Period  Weeks    Status  On-going      PT SHORT TERM GOAL #3   Title  able to perform 5x sit to stand test without using UE support due to increased LE strength    Time  4    Period  Weeks    Status  On-going        PT Long Term Goals - 11/21/17 1221      PT LONG TERM GOAL #1   Title  independent with advanced HEP    Time  6    Period  Weeks    Status  New    Target Date  01/02/18       PT LONG TERM GOAL #2   Title  FOTO < or = to 37% limitation    Time  6    Period  Days    Status  New    Target Date  01/02/18      PT LONG TERM GOAL #3   Title  improved knee extension to 0 degreee, and flexion to 120 degrees for improved gait    Time  8    Period  Weeks    Status  New    Target Date  01/02/18      PT LONG TERM GOAL #4   Title  improved 5 x sit to stand to < or = to 12 seconds to demonstrate improved strength and reduced risk of falls    Time  6    Period  Weeks    Status  New    Target Date  01/02/18      PT LONG TERM GOAL #5   Title  ind with advanced HEP and able to return to walking 20 minutes/day for return to walking for exercise    Time  6    Period  Weeks    Status  New    Target Date  01/02/18            Plan - 11/23/17 1158    Clinical Impression Statement  Patient is doing well with ROM.  No progress of goals today due to today is first treatment since eval.  She continues to have some swelling and decreased ROM.  Pt needed tactile cues and tapping quads to initiate muscle contraction.  Pt continues to benefit from skilled PT for strength and ROM.    PT Treatment/Interventions  ADLs/Self Care Home Management;Biofeedback;Cryotherapy;Electrical Stimulation;Moist Heat;Ultrasound;Gait training;Stair training;Functional mobility training;Therapeutic activities;Therapeutic exercise;Balance training;Neuromuscular re-education;Patient/family education;Manual techniques;Manual lymph drainage;Scar mobilization;Passive range of motion;Dry needling;Taping;Vasopneumatic Device    PT Next Visit Plan  right knee ROM; bike, vasopneumatic, progress  strengthening as tolerated       Patient will benefit from skilled therapeutic intervention in order to improve the following deficits and impairments:  Abnormal gait, Decreased activity tolerance, Decreased endurance, Pain, Postural dysfunction, Decreased strength, Decreased range of motion, Difficulty  walking  Visit Diagnosis: Chronic pain of right knee  Stiffness of right knee, not elsewhere classified  Muscle weakness (generalized)  Stiffness of left knee, not elsewhere classified     Problem List Patient Active Problem List   Diagnosis Date Noted  . Anemia 11/01/2008  . Allergic rhinitis 11/01/2008  . Essential hypertension 09/03/2008  . OA (osteoarthritis) of knee 09/03/2008    Vincente PoliJakki Crosser, PT 11/23/2017, 12:25 PM  [facility] [address], [address] [place], [place], [address] Phone: [phone]   Fax:  [phone]  Name: Paula Kelley MRN: [id number] Date of Birth: May 20, 1951

## 2017-11-28 ENCOUNTER — Encounter: Payer: Self-pay | Admitting: Physical Therapy

## 2017-11-28 ENCOUNTER — Ambulatory Visit: Payer: BC Managed Care – PPO | Admitting: Physical Therapy

## 2017-11-28 DIAGNOSIS — M25661 Stiffness of right knee, not elsewhere classified: Secondary | ICD-10-CM

## 2017-11-28 DIAGNOSIS — M25662 Stiffness of left knee, not elsewhere classified: Secondary | ICD-10-CM

## 2017-11-28 DIAGNOSIS — G8929 Other chronic pain: Secondary | ICD-10-CM

## 2017-11-28 DIAGNOSIS — M25561 Pain in right knee: Principal | ICD-10-CM

## 2017-11-28 DIAGNOSIS — M6281 Muscle weakness (generalized): Secondary | ICD-10-CM

## 2017-11-28 NOTE — Therapy (Signed)
[facility] [address], [address] [place], [place], [address] Phone: [phone]   Fax:  [phone]  Physical Therapy Treatment  Patient Details  Name: Paula Kelley MRN: [id number] Date of Birth: 02/19/1951 Referring Provider: Ollen Gross   Encounter Date: 11/28/2017  PT End of Session - 11/28/17 1115    Visit Number  3    Date for PT Re-Evaluation  01/02/18    PT Start Time  1100    PT Stop Time  1155    PT Time Calculation (min)  55 min    Activity Tolerance  Patient tolerated treatment well    Behavior During Therapy  [facility] for tasks assessed/performed       Past Medical History:  Diagnosis Date  . Allergic rhinitis   . Anxiety   . DJD (degenerative joint disease)    Sees Dr. Dierdre Forth  . Headache(784.0)   . Hypertension   . Mild anemia    iron def, reports on iron her whole life  . Obesity   . URI (upper respiratory infection)   . UTI (lower urinary tract infection)     Past Surgical History:  Procedure Laterality Date  . COLONOSCOPY    . svd     2 live births  . 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 TOTAL KNEE ARTHROPLASTY Right 10/31/2017   Procedure: RIGHT TOTAL KNEE ARTHROPLASTY;  Surgeon: Ollen Gross, MD;  [facility]: [facility];  Service: Orthopedics;  Laterality: Right;  50 mins    There were no vitals filed for this visit.  Subjective Assessment - 11/28/17 1114    Subjective  It just feels tight in the mornings    Pertinent History  Rt TKA 10/31/17; history of Lt TKA last year    Limitations  Walking    Patient Stated Goals  be able to walk the campus or walk at the gym for 45 minutes    Currently in Pain?  No/denies                      [facility] Adult PT Treatment/Exercise - 11/28/17 0001      Ambulation/Gait   Ambulation/Gait   Yes    Ambulation/Gait Assistance  7: Independent    Ambulation Distance (Feet)  400 Feet    Gait Pattern  -- decreased right knee flexion    Gait Comments  cues to increase knee flexion and even step length      Knee/Hip Exercises: Aerobic   Stationary Bike  L0 x 10 min for ROM; last 2 minutes with ankle DF      Knee/Hip Exercises: Standing   Forward Step Up  Right;20 reps;Hand Hold: 1;Hand Hold: 0    Rebounder   3 ways - 1 min each way no UE as tolerated      Knee/Hip Exercises: Seated   Long Arc Quad  Strengthening;Right;2 sets;10 reps add 3# ankle weight    Hamstring Curl  Strengthening;Right;20 reps    Hamstring Limitations  green band    Sit to [employer]  10 reps;without UE support mat and black foam      Cryotherapy   Number Minutes Cryotherapy  15 Minutes    Cryotherapy Location  Knee      Vasopneumatic   Number Minutes Vasopneumatic   15 minutes    Vasopnuematic  Location   Knee    Vasopneumatic Pressure  Medium    Vasopneumatic Temperature   3 snow flakes             PT Education - 11/28/17 1129    Education provided  Yes    Education Details  calf stretch, hamstring curl standing, gait    Person(s) Educated  Patient    Methods  Explanation;Demonstration;Handout;Verbal cues    Comprehension  Verbalized understanding;Returned demonstration       PT Short Term Goals - 11/23/17 1105      PT SHORT TERM GOAL #1   Title  independent with inital HEP    Time  4    Period  Weeks    Status  On-going      PT SHORT TERM GOAL #2   Title  demonstrates even gait with LRAD    Time  4    Period  Weeks    Status  On-going      PT SHORT TERM GOAL #3   Title  able to perform 5x sit to stand test without using UE support due to increased LE strength    Time  4    Period  Weeks    Status  On-going        PT Long Term Goals - 11/21/17 1221      PT LONG TERM GOAL #1   Title  independent with advanced HEP    Time  6    Period  Weeks    Status  New    Target Date   01/02/18      PT LONG TERM GOAL #2   Title  FOTO < or = to 37% limitation    Time  6    Period  Days    Status  New    Target Date  01/02/18      PT LONG TERM GOAL #3   Title  improved knee extension to 0 degreee, and flexion to 120 degrees for improved gait    Time  8    Period  Weeks    Status  New    Target Date  01/02/18      PT LONG TERM GOAL #4   Title  improved 5 x sit to stand to < or = to 12 seconds to demonstrate improved strength and reduced risk of falls    Time  6    Period  Weeks    Status  New    Target Date  01/02/18      PT LONG TERM GOAL #5   Title  ind with advanced HEP and able to return to walking 20 minutes/day for return to walking for exercise    Time  6    Period  Weeks    Status  New    Target Date  01/02/18            Plan - 11/28/17 1232    Clinical Impression Statement  Patient was able to increase resistence with exercises today.  Pt needed cues and verbal reminders to increase right knee flexion during swing through.  She demonstrates even step length and good weight bearing into right LE.  Pt will benefit from skilled PT to continue to progress strength and ROM for return to functional activities.    PT Treatment/Interventions  ADLs/Self Care Home Management;Biofeedback;Cryotherapy;Electrical Stimulation;Moist Heat;Ultrasound;Gait training;Stair training;Functional mobility training;Therapeutic activities;Therapeutic exercise;Balance training;Neuromuscular re-education;Patient/family education;Manual techniques;Manual lymph drainage;Scar mobilization;Passive range of motion;Dry needling;Taping;Vasopneumatic Device  PT Next Visit Plan  right knee ROM and strengthening; bike, vasopneumatic, progress strengthening as tolerated    Consulted and Agree with Plan of Care  Patient       Patient will benefit from skilled therapeutic intervention in order to improve the following deficits and impairments:  Abnormal gait, Decreased activity  tolerance, Decreased endurance, Pain, Postural dysfunction, Decreased strength, Decreased range of motion, Difficulty walking  Visit Diagnosis: Chronic pain of right knee  Stiffness of right knee, not elsewhere classified  Muscle weakness (generalized)  Stiffness of left knee, not elsewhere classified     Problem List Patient Active Problem List   Diagnosis Date Noted  . Anemia 11/01/2008  . Allergic rhinitis 11/01/2008  . Essential hypertension 09/03/2008  . OA (osteoarthritis) of knee 09/03/2008    Vincente PoliJakki Crosser, PT 11/28/2017, 1:56 PM  [facility] [address], [address] [place], [place], [address] Phone: [phone]   Fax:  [phone]  Name: Paula Kelley MRN: [id number] Date of Birth: December 26, 1950

## 2017-11-28 NOTE — Patient Instructions (Addendum)
   STANDING CALF STRETCH  - GASTROCNEMIUS  Start by standing in front of a wall or other sturdy object. Step forward with one foot and maintain your toes on both feet to be pointed straight forward. Keep the leg behind you with a straight knee during the stretch.   Lean forward towards the wall and support yourself with your arms as you allow your front knee to bend until a gentle stretch is felt along the back of your leg that is most behind you.   Move closer or further away from the wall to control the stretch of the back leg. Also you can adjust the bend of the front knee to control the stretch as well.  Hold 30 seconds, do 3x each side    STANDING HAMSTRING CURLS  While standing, bend your knee so that your heel moves towards your buttock. Lower back down until first contact with floor and repeat.  Keep knees in-line with one another. 2 sets of 10 on each side   WALKING:  Try increasing walking from 2 minutes at a time every 1-2 hours  Emphasize heel strike and knee bending  [facility] [address], [address] [place], [place] [address] Phone # [phone] Fax [phone]

## 2017-11-30 ENCOUNTER — Ambulatory Visit: Payer: BC Managed Care – PPO | Admitting: Physical Therapy

## 2017-11-30 DIAGNOSIS — M25661 Stiffness of right knee, not elsewhere classified: Secondary | ICD-10-CM

## 2017-11-30 DIAGNOSIS — M25662 Stiffness of left knee, not elsewhere classified: Secondary | ICD-10-CM

## 2017-11-30 DIAGNOSIS — G8929 Other chronic pain: Secondary | ICD-10-CM

## 2017-11-30 DIAGNOSIS — M25561 Pain in right knee: Secondary | ICD-10-CM | POA: Diagnosis not present

## 2017-11-30 DIAGNOSIS — M6281 Muscle weakness (generalized): Secondary | ICD-10-CM

## 2017-11-30 NOTE — Therapy (Signed)
[facility] [address] [place], [place], [address] Phone: [phone]   Fax:  [phone]  Physical Therapy Treatment  Patient Details  Name: Paula Kelley MRN: [id number] Date of Birth: 1950-12-23 Referring Provider: Ollen Gross   Encounter Date: 11/30/2017  PT End of Session - 11/30/17 1122    Visit Number  4    Date for PT Re-Evaluation  01/02/18    PT Start Time  1104    PT Stop Time  1153    PT Time Calculation (min)  49 min    Activity Tolerance  Patient tolerated treatment well    Behavior During Therapy  [facility] for tasks assessed/performed       Past Medical History:  Diagnosis Date  . Allergic rhinitis   . Anxiety   . DJD (degenerative joint disease)    Sees Dr. Dierdre Forth  . Headache(784.0)   . Hypertension   . Mild anemia    iron def, reports on iron her whole life  . Obesity   . URI (upper respiratory infection)   . UTI (lower urinary tract infection)     Past Surgical History:  Procedure Laterality Date  . COLONOSCOPY    . svd     2 live births  . 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 TOTAL KNEE ARTHROPLASTY Right 10/31/2017   Procedure: RIGHT TOTAL KNEE ARTHROPLASTY;  Surgeon: Ollen Gross, MD;  Location: [facility];  Service: Orthopedics;  Laterality: Right;  50 mins    There were no vitals filed for this visit.  Subjective Assessment - 11/30/17 1118    Subjective  I put ice on it this morning and that helps. I am beginning to sleep a little better now.  Pt denies pain, just having stiffness.    Pertinent History  Rt TKA 10/31/17; history of Lt TKA last year    Limitations  Other (comment)    Patient Stated Goals  be able to walk the campus or walk at the gym for 45 minutes    Currently in Pain?  No/denies                       [facility] Adult PT Treatment/Exercise - 11/30/17 0001      Knee/Hip Exercises: Aerobic   Stationary Bike  L0 x 8 min for ROM; last 2 minutes with ankle DF PT present for status update      Knee/Hip Exercises: Machines for Strengthening   Total Gym Leg Press  bilateral 30# - 3x20 press into heels for improved knee extension      Knee/Hip Exercises: Standing   Forward Step Up  Right;Hand Hold: 0;10 reps;Step Height: 4"    Rebounder   3 ways - 1 min each way no UE as tolerated    Other Standing Knee Exercises  hip 3 ways - 10 x each side    Other Standing Knee Exercises  standing hamstring curl - 10 each side      Knee/Hip Exercises: Seated   Long Arc Quad  --    Hamstring Curl  --    Hamstring Limitations  --    Sit to Sand  --      Cryotherapy   Number Minutes Cryotherapy  15 Minutes    Cryotherapy Location  Knee  Vasopneumatic   Number Minutes Vasopneumatic   15 minutes    Vasopnuematic Location   Knee    Vasopneumatic Pressure  Medium    Vasopneumatic Temperature   3 snow flakes               PT Short Term Goals - 11/23/17 1105      PT SHORT TERM GOAL #1   Title  independent with inital HEP    Time  4    Period  Weeks    Status  On-going      PT SHORT TERM GOAL #2   Title  demonstrates even gait with LRAD    Time  4    Period  Weeks    Status  On-going      PT SHORT TERM GOAL #3   Title  able to perform 5x sit to stand test without using UE support due to increased LE strength    Time  4    Period  Weeks    Status  On-going        PT Long Term Goals - 11/21/17 1221      PT LONG TERM GOAL #1   Title  independent with advanced HEP    Time  6    Period  Weeks    Status  New    Target Date  01/02/18      PT LONG TERM GOAL #2   Title  FOTO < or = to 37% limitation    Time  6    Period  Days    Status  New    Target Date  01/02/18      PT LONG TERM GOAL #3   Title  improved knee extension to 0 degreee, and  flexion to 120 degrees for improved gait    Time  8    Period  Weeks    Status  New    Target Date  01/02/18      PT LONG TERM GOAL #4   Title  improved 5 x sit to stand to < or = to 12 seconds to demonstrate improved strength and reduced risk of falls    Time  6    Period  Weeks    Status  New    Target Date  01/02/18      PT LONG TERM GOAL #5   Title  ind with advanced HEP and able to return to walking 20 minutes/day for return to walking for exercise    Time  6    Period  Weeks    Status  New    Target Date  01/02/18            Plan - 11/30/17 1124    Clinical Impression Statement  Patient was able to tolerate adding the leg press today.  She continues to do well and is showing strength gains.  She needs cues during gait to increase heel strike and knee flexion on swing through.  Pt continues to benefit from skilled PT to work on increased strength and ROM for return to funcitonal activiites.    PT Treatment/Interventions  ADLs/Self Care Home Management;Biofeedback;Cryotherapy;Electrical Stimulation;Moist Heat;Ultrasound;Gait training;Stair training;Functional mobility training;Therapeutic activities;Therapeutic exercise;Balance training;Neuromuscular re-education;Patient/family education;Manual techniques;Manual lymph drainage;Scar mobilization;Passive range of motion;Dry needling;Taping;Vasopneumatic Device    PT Next Visit Plan  right knee ROM and strengthening; bike, vasopneumatic, progress strengthening and ROM as tolerated    Consulted and Agree with Plan of Care  Patient  Patient will benefit from skilled therapeutic intervention in order to improve the following deficits and impairments:  Abnormal gait, Decreased activity tolerance, Decreased endurance, Pain, Postural dysfunction, Decreased strength, Decreased range of motion, Difficulty walking  Visit Diagnosis: Chronic pain of right knee  Stiffness of right knee, not elsewhere classified  Muscle weakness  (generalized)  Stiffness of left knee, not elsewhere classified     Problem List Patient Active Problem List   Diagnosis Date Noted  . Anemia 11/01/2008  . Allergic rhinitis 11/01/2008  . Essential hypertension 09/03/2008  . OA (osteoarthritis) of knee 09/03/2008    Vincente Poli, PT 11/30/2017, 11:43 AM  [facility] [address] [place], [place], [address] Phone: [phone]   Fax:  [phone]  Name: Paula Kelley MRN: [id number] Date of Birth: 1951-09-09

## 2017-12-05 ENCOUNTER — Encounter: Payer: Self-pay | Admitting: Physical Therapy

## 2017-12-05 ENCOUNTER — Ambulatory Visit: Payer: BC Managed Care – PPO | Admitting: Physical Therapy

## 2017-12-05 DIAGNOSIS — G8929 Other chronic pain: Secondary | ICD-10-CM

## 2017-12-05 DIAGNOSIS — M25561 Pain in right knee: Secondary | ICD-10-CM | POA: Diagnosis not present

## 2017-12-05 DIAGNOSIS — M25661 Stiffness of right knee, not elsewhere classified: Secondary | ICD-10-CM

## 2017-12-05 DIAGNOSIS — M25662 Stiffness of left knee, not elsewhere classified: Secondary | ICD-10-CM

## 2017-12-05 DIAGNOSIS — M6281 Muscle weakness (generalized): Secondary | ICD-10-CM

## 2017-12-05 NOTE — Therapy (Signed)
[facility] [address] [place], [place], [address] Phone: [phone]   Fax:  [phone]  Physical Therapy Treatment  Patient Details  Name: Paula Kelley MRN: [id number] Date of Birth: 1951-03-06 Referring Provider: Ollen Gross   Encounter Date: 12/05/2017  PT End of Session - 12/05/17 1115    Visit Number  5    Date for PT Re-Evaluation  01/02/18    PT Start Time  1056    PT Stop Time  1140    PT Time Calculation (min)  44 min    Activity Tolerance  Patient tolerated treatment well    Behavior During Therapy  [facility] for tasks assessed/performed       Past Medical History:  Diagnosis Date  . Allergic rhinitis   . Anxiety   . DJD (degenerative joint disease)    Sees Dr. Dierdre Forth  . Headache(784.0)   . Hypertension   . Mild anemia    iron def, reports on iron her whole life  . Obesity   . URI (upper respiratory infection)   . UTI (lower urinary tract infection)     Past Surgical History:  Procedure Laterality Date  . COLONOSCOPY    . svd     2 live births  . 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 TOTAL KNEE ARTHROPLASTY Right 10/31/2017   Procedure: RIGHT TOTAL KNEE ARTHROPLASTY;  Surgeon: Ollen Gross, MD;  Location: [facility];  Service: Orthopedics;  Laterality: Right;  50 mins    There were no vitals filed for this visit.  Subjective Assessment - 12/05/17 1112    Subjective  I iced it this morning and still stiff in the morning.      Pertinent History  Rt TKA 10/31/17; history of Lt TKA last year    Patient Stated Goals  be able to walk the campus or walk at the gym for 45 minutes    Currently in Pain?  No/denies                      [facility] Adult PT Treatment/Exercise - 12/05/17 0001      Knee/Hip Exercises: Stretches   Gastroc  Stretch  Right;3 reps;30 seconds      Knee/Hip Exercises: Aerobic   Stationary Bike  L0 x 8 min for ROM; last 2 minutes with ankle DF PT present for status update    Nustep  L2 x 7 min SPM at least 50      Knee/Hip Exercises: Machines for Strengthening   Total Gym Leg Press  bilateral 50# - x20 SEAT 7; press into heels for improved knee extension      Knee/Hip Exercises: Standing   Forward Step Up  Right;Hand Hold: 0;10 reps;Step Height: 6"    Other Standing Knee Exercises  --    Other Standing Knee Exercises  --      Knee/Hip Exercises: Seated   Sit to Sand  20 reps;without UE support slightly elevated      Cryotherapy   Number Minutes Cryotherapy  --    Cryotherapy Location  --      Vasopneumatic   Number Minutes Vasopneumatic   --    Vasopnuematic Location   --    Vasopneumatic Pressure  --    Vasopneumatic Temperature   --  PT Short Term Goals - 12/05/17 1130      PT SHORT TERM GOAL #1   Title  independent with inital HEP    Time  4    Period  Weeks    Status  Achieved      PT SHORT TERM GOAL #2   Title  demonstrates even gait with LRAD    Time  4    Period  Weeks    Status  Achieved        PT Long Term Goals - 11/21/17 1221      PT LONG TERM GOAL #1   Title  independent with advanced HEP    Time  6    Period  Weeks    Status  New    Target Date  01/02/18      PT LONG TERM GOAL #2   Title  FOTO < or = to 37% limitation    Time  6    Period  Days    Status  New    Target Date  01/02/18      PT LONG TERM GOAL #3   Title  improved knee extension to 0 degreee, and flexion to 120 degrees for improved gait    Time  8    Period  Weeks    Status  New    Target Date  01/02/18      PT LONG TERM GOAL #4   Title  improved 5 x sit to stand to < or = to 12 seconds to demonstrate improved strength and reduced risk of falls    Time  6    Period  Weeks    Status  New    Target Date  01/02/18      PT LONG TERM GOAL #5   Title  ind  with advanced HEP and able to return to walking 20 minutes/day for return to walking for exercise    Time  6    Period  Weeks    Status  New    Target Date  01/02/18            Plan - 12/05/17 1126    Clinical Impression Statement  Patient continues to tolerate increased resistance.  She was able to perform single leg on leg press at 30 lb and tolerated increased step height.  Pt needed cues for equal weight bearing when doing sit to stand.  Pt had increased ROM and able to move seat closer on recumbant bike.  Pt will benefit from skilled PT for edema management, increased strength and ROM.     PT Treatment/Interventions  ADLs/Self Care Home Management;Biofeedback;Cryotherapy;Electrical Stimulation;Moist Heat;Ultrasound;Gait training;Stair training;Functional mobility training;Therapeutic activities;Therapeutic exercise;Balance training;Neuromuscular re-education;Patient/family education;Manual techniques;Manual lymph drainage;Scar mobilization;Passive range of motion;Dry needling;Taping;Vasopneumatic Device    PT Next Visit Plan  right knee ROM and strengthening; bike, vasopneumatic, progress strengthening and ROM as tolerated    Consulted and Agree with Plan of Care  Patient       Patient will benefit from skilled therapeutic intervention in order to improve the following deficits and impairments:  Abnormal gait, Decreased activity tolerance, Decreased endurance, Pain, Postural dysfunction, Decreased strength, Decreased range of motion, Difficulty walking  Visit Diagnosis: Chronic pain of right knee  Stiffness of right knee, not elsewhere classified  Muscle weakness (generalized)  Stiffness of left knee, not elsewhere classified     Problem List Patient Active Problem List   Diagnosis Date Noted  . Anemia 11/01/2008  . Allergic rhinitis 11/01/2008  .  Essential hypertension 09/03/2008  . OA (osteoarthritis) of knee 09/03/2008    Vincente PoliJakki Crosser, PT 12/05/2017, 11:45  AM  [facility] [address], [address] [place], [place], [address] Phone: [phone]   Fax:  [phone]  Name: Paula Kelley MRN: [id number] Date of Birth: 16-Mar-1951

## 2017-12-07 ENCOUNTER — Ambulatory Visit: Payer: BC Managed Care – PPO | Admitting: Physical Therapy

## 2017-12-07 ENCOUNTER — Encounter: Payer: Self-pay | Admitting: Physical Therapy

## 2017-12-07 DIAGNOSIS — G8929 Other chronic pain: Secondary | ICD-10-CM

## 2017-12-07 DIAGNOSIS — M25561 Pain in right knee: Secondary | ICD-10-CM | POA: Diagnosis not present

## 2017-12-07 DIAGNOSIS — M6281 Muscle weakness (generalized): Secondary | ICD-10-CM

## 2017-12-07 DIAGNOSIS — M25662 Stiffness of left knee, not elsewhere classified: Secondary | ICD-10-CM

## 2017-12-07 DIAGNOSIS — M25661 Stiffness of right knee, not elsewhere classified: Secondary | ICD-10-CM

## 2017-12-07 NOTE — Therapy (Signed)
[facility] [address], [address] [place], [place], [address] Phone: [phone]   Fax:  [phone]  Physical Therapy Treatment  Patient Details  Name: Paula Kelley MRN: [id number] Date of Birth: [birth year] Referring Provider: Ollen Gross   Encounter Date: 12/07/2017  PT End of Session - 12/07/17 1110    Visit Number  6    Date for PT Re-Evaluation  01/02/18    PT Start Time  1100    PT Stop Time  1155    PT Time Calculation (min)  55 min    Activity Tolerance  Patient tolerated treatment well    Behavior During Therapy  [facility] for tasks assessed/performed       Past Medical History:  Diagnosis Date  . Allergic rhinitis   . Anxiety   . DJD (degenerative joint disease)    Sees Dr. Dierdre Forth  . Headache(784.0)   . Hypertension   . Mild anemia    iron def, reports on iron her whole life  . Obesity   . URI (upper respiratory infection)   . UTI (lower urinary tract infection)     Past Surgical History:  Procedure Laterality Date  . COLONOSCOPY    . svd     2 live births  . 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 TOTAL KNEE ARTHROPLASTY Right 10/31/2017   Procedure: RIGHT TOTAL KNEE ARTHROPLASTY;  Surgeon: Ollen Gross, MD;  [facility];  Service: Orthopedics;  Laterality: Right;  50 mins    There were no vitals filed for this visit.      [facility] PT Assessment - 12/07/17 0001      AROM   Right Knee Extension  12    Right Knee Flexion  108      Strength   Right Hip External Rotation   5/5    Right Hip Internal Rotation  5/5    Right Hip ABduction  4+/5    Right Knee Flexion  4+/5    Right Knee Extension  4+/5                  [facility] Adult PT Treatment/Exercise - 12/07/17 0001      Knee/Hip Exercises: Stretches   Gastroc Stretch  Right;3  reps;30 seconds      Knee/Hip Exercises: Aerobic   Stationary Bike  L0 x 8 min for ROM; last 2 minutes with ankle DF PT present for status update    Nustep  --      Knee/Hip Exercises: Machines for Strengthening   Total Gym Leg Press  bilateral 55# - x20 SEAT 7; press into heels for improved knee extension      Knee/Hip Exercises: Standing   Knee Flexion  Strengthening;Right;Left;10 reps marching add 2#; cues for posture    Hip Abduction  Stengthening;Right;Left;20 reps;Knee straight add 2#; cues for posture and UE support x1    Forward Step Up  Right;Hand Hold: 0;10 reps;Step Height: 6";2 sets      Knee/Hip Exercises: Seated   Sit to Sand  --      Vasopneumatic   Number Minutes Vasopneumatic   15 minutes    Vasopnuematic Location   Knee    Vasopneumatic Pressure  Medium    Vasopneumatic Temperature   3 snow flakes  PT Short Term Goals - 12/05/17 1130      PT SHORT TERM GOAL #1   Title  independent with inital HEP    Time  4    Period  Weeks    Status  Achieved      PT SHORT TERM GOAL #2   Title  demonstrates even gait with LRAD    Time  4    Period  Weeks    Status  Achieved        PT Long Term Goals - 12/07/17 1139      PT LONG TERM GOAL #1   Title  independent with advanced HEP    Time  6    Period  Weeks    Status  On-going      PT LONG TERM GOAL #2   Title  FOTO < or = to 37% limitation    Time  6    Period  Weeks    Status  On-going      PT LONG TERM GOAL #3   Title  improved knee extension to 0 degreee, and flexion to 120 degrees for improved gait    Time  6    Period  Weeks    Status  On-going      PT LONG TERM GOAL #4   Title  improved 5 x sit to stand to < or = to 12 seconds to demonstrate improved strength and reduced risk of falls    Time  6    Period  Weeks    Status  On-going      PT LONG TERM GOAL #5   Title  ind with advanced HEP and able to return to walking 20 minutes/day for return to walking for exercise     Time  6    Period  Weeks    Status  On-going            Plan - 12/07/17 1140    Clinical Impression Statement  Patient demonstrates improved strength and ROM.  She continues to demonstrate progress with increased reps and resistance during exercises.  Pt needs cues for increased knee flexion for improved gait.  She demonstrates even gait today without any AD.  Pt continues to benefit from skilled PT to return to maximum function.    PT Treatment/Interventions  ADLs/Self Care Home Management;Biofeedback;Cryotherapy;Electrical Stimulation;Moist Heat;Ultrasound;Gait training;Stair training;Functional mobility training;Therapeutic activities;Therapeutic exercise;Balance training;Neuromuscular re-education;Patient/family education;Manual techniques;Manual lymph drainage;Scar mobilization;Passive range of motion;Dry needling;Taping;Vasopneumatic Device    PT Next Visit Plan  right knee ROM and strengthening; bike, vasopneumatic, progress strengthening and ROM as tolerated    Consulted and Agree with Plan of Care  Patient       Patient will benefit from skilled therapeutic intervention in order to improve the following deficits and impairments:  Abnormal gait, Decreased activity tolerance, Decreased endurance, Pain, Postural dysfunction, Decreased strength, Decreased range of motion, Difficulty walking  Visit Diagnosis: Chronic pain of right knee  Stiffness of right knee, not elsewhere classified  Muscle weakness (generalized)  Stiffness of left knee, not elsewhere classified     Problem List Patient Active Problem List   Diagnosis Date Noted  . Anemia 11/01/2008  . Allergic rhinitis 11/01/2008  . Essential hypertension 09/03/2008  . OA (osteoarthritis) of knee 09/03/2008    Vincente PoliJakki Crosser, PT 12/07/2017, 11:53 AM  [facility] [address], [address] [place], [place], [address] Phone: [phone]   Fax:  [phone]  Name:  Voncille LoOlga L Schwarz  MRN: [id number] Date of Birth: [birth year]

## 2017-12-12 ENCOUNTER — Ambulatory Visit: Payer: BC Managed Care – PPO | Admitting: Physical Therapy

## 2017-12-12 ENCOUNTER — Encounter: Payer: Self-pay | Admitting: Physical Therapy

## 2017-12-12 DIAGNOSIS — M25662 Stiffness of left knee, not elsewhere classified: Secondary | ICD-10-CM

## 2017-12-12 DIAGNOSIS — M25661 Stiffness of right knee, not elsewhere classified: Secondary | ICD-10-CM

## 2017-12-12 DIAGNOSIS — G8929 Other chronic pain: Secondary | ICD-10-CM

## 2017-12-12 DIAGNOSIS — M6281 Muscle weakness (generalized): Secondary | ICD-10-CM

## 2017-12-12 DIAGNOSIS — M25561 Pain in right knee: Principal | ICD-10-CM

## 2017-12-12 NOTE — Therapy (Signed)
[facility] [address], [address] [place], [place], [address] Phone: [phone]   Fax:  [phone]  Physical Therapy Treatment  Patient Details  Name: Paula Kelley MRN: [id number] Date of Birth: [birth year] Referring Provider: Ollen GrossFrank Aluisio   Encounter Date: 12/12/2017  PT End of Session - 12/12/17 1111    Visit Number  7    Date for PT Re-Evaluation  01/02/18    PT Start Time  1058    PT Stop Time  1200    PT Time Calculation (min)  62 min    Activity Tolerance  Patient tolerated treatment well    Behavior During Therapy  [facility] for tasks assessed/performed       Past Medical History:  Diagnosis Date  . Allergic rhinitis   . Anxiety   . DJD (degenerative joint disease)    Sees Dr. Dierdre ForthBeekman  . Headache(784.0)   . Hypertension   . Mild anemia    iron def, reports on iron her whole life  . Obesity   . URI (upper respiratory infection)   . UTI (lower urinary tract infection)     Past Surgical History:  Procedure Laterality Date  . COLONOSCOPY    . svd     2 live births  . 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 TOTAL KNEE ARTHROPLASTY Right 10/31/2017   Procedure: RIGHT TOTAL KNEE ARTHROPLASTY;  Surgeon: Ollen GrossAluisio, Frank, MD;  Location: [facility];  Service: Orthopedics;  Laterality: Right;  50 mins    There were no vitals filed for this visit.  Subjective Assessment - 12/12/17 1109    Subjective  I went to the doctor and he said I was doing great and only need 2 more weeks of PT.  Pt denies pain reports just morning stiffness    Pertinent History  Rt TKA 10/31/17; history of Lt TKA last year    Patient Stated Goals  be able to walk the campus or walk at the gym for 45 minutes    Currently in Pain?  No/denies                      [facility] Adult [facility]  Treatment/Exercise - 12/12/17 0001      Knee/Hip Exercises: Stretches   Gastroc Stretch  Right;3 reps;30 seconds      Knee/Hip Exercises: Aerobic   Stationary Bike  L0 x 8 min for ROM; last 2 minutes with ankle DF PT present for status update      Knee/Hip Exercises: Machines for Strengthening   Total Gym Leg Press  bilateral 60# - x20 SEAT 7; press into heels for improved knee extension      Knee/Hip Exercises: Standing   Knee Flexion  Strengthening;Right;Left;10 reps marching add 2#; cues for posture; stand on foam    Hip Abduction  Stengthening;Right;Left;20 reps;Knee straight add 2#; cues for posture and UE support x1; stand on foam    Forward Step Up  Right;Hand Hold: 0;10 reps;Step Height: 6";2 sets      Vasopneumatic   Number Minutes Vasopneumatic   15 minutes    Vasopnuematic Location   Knee    Vasopneumatic Pressure  Medium    Vasopneumatic Temperature   3 snow flakes               PT Short Term Goals -  12/05/17 1130      PT SHORT TERM GOAL #1   Title  independent with inital HEP    Time  4    Period  Weeks    Status  Achieved      PT SHORT TERM GOAL #2   Title  demonstrates even gait with LRAD    Time  4    Period  Weeks    Status  Achieved        PT Long Term Goals - 12/07/17 1139      PT LONG TERM GOAL #1   Title  independent with advanced HEP    Time  6    Period  Weeks    Status  On-going      PT LONG TERM GOAL #2   Title  FOTO < or = to 37% limitation    Time  6    Period  Weeks    Status  On-going      PT LONG TERM GOAL #3   Title  improved knee extension to 0 degreee, and flexion to 120 degrees for improved gait    Time  6    Period  Weeks    Status  On-going      PT LONG TERM GOAL #4   Title  improved 5 x sit to stand to < or = to 12 seconds to demonstrate improved strength and reduced risk of falls    Time  6    Period  Weeks    Status  On-going      PT LONG TERM GOAL #5   Title  ind with advanced HEP and able to return to  walking 20 minutes/day for return to walking for exercise    Time  6    Period  Weeks    Status  On-going            Plan - 12/12/17 1142    Clinical Impression Statement  Patient continues to be able to increase resistance.  She was able to stand on foam mat for exercises and demonstrates good control with 1-0 UE support.  Pt continues to benefit from skilled PT to work on strengh and ROM for return to activities     PT Treatment/Interventions  ADLs/Self Care Home Management;Biofeedback;Cryotherapy;Electrical Stimulation;Moist Heat;Ultrasound;Gait training;Stair training;Functional mobility training;Therapeutic activities;Therapeutic exercise;Balance training;Neuromuscular re-education;Patient/family education;Manual techniques;Manual lymph drainage;Scar mobilization;Passive range of motion;Dry needling;Taping;Vasopneumatic Device    PT Next Visit Plan  right knee ROM and strengthening; bike, vasopneumatic, progress strengthening and ROM as tolerated    Consulted and Agree with Plan of Care  Patient       Patient will benefit from skilled therapeutic intervention in order to improve the following deficits and impairments:  Abnormal gait, Decreased activity tolerance, Decreased endurance, Pain, Postural dysfunction, Decreased strength, Decreased range of motion, Difficulty walking  Visit Diagnosis: Chronic pain of right knee  Stiffness of right knee, not elsewhere classified  Muscle weakness (generalized)  Stiffness of left knee, not elsewhere classified     Problem List Patient Active Problem List   Diagnosis Date Noted  . Anemia 11/01/2008  . Allergic rhinitis 11/01/2008  . Essential hypertension 09/03/2008  . OA (osteoarthritis) of knee 09/03/2008    Vincente Poli, PT 12/12/2017, 11:45 AM  [facility] [address] [place], [place], [address] Phone: [phone]   Fax:  [phone]  Name: Paula Bodine  Kelley MRN: [id number] Date of Birth: 16-Sep-1951

## 2017-12-14 ENCOUNTER — Ambulatory Visit: Payer: BC Managed Care – PPO | Admitting: Physical Therapy

## 2017-12-14 DIAGNOSIS — G8929 Other chronic pain: Secondary | ICD-10-CM

## 2017-12-14 DIAGNOSIS — M25662 Stiffness of left knee, not elsewhere classified: Secondary | ICD-10-CM

## 2017-12-14 DIAGNOSIS — M6281 Muscle weakness (generalized): Secondary | ICD-10-CM

## 2017-12-14 DIAGNOSIS — M25561 Pain in right knee: Principal | ICD-10-CM

## 2017-12-14 DIAGNOSIS — M25661 Stiffness of right knee, not elsewhere classified: Secondary | ICD-10-CM

## 2017-12-14 NOTE — Therapy (Signed)
[facility] [address], [address] [place], [place], [address] Phone: [phone]   Fax:  [phone]  Physical Therapy Treatment  Patient Details  Name: Paula LoOlga L Kelley MRN: [id number] Date of Birth: 26-Dec-1950 Referring Provider: Ollen GrossFrank Aluisio   Encounter Date: 12/14/2017  PT End of Session - 12/14/17 1107    Visit Number  8    Date for PT Re-Evaluation  01/02/18    PT Start Time  1059    PT Stop Time  1200    PT Time Calculation (min)  61 min    Activity Tolerance  Patient tolerated treatment well    Behavior During Therapy  [facility] for tasks assessed/performed       Past Medical History:  Diagnosis Date  . Allergic rhinitis   . Anxiety   . DJD (degenerative joint disease)    Sees Dr. Dierdre ForthBeekman  . Headache(784.0)   . Hypertension   . Mild anemia    iron def, reports on iron her whole life  . Obesity   . URI (upper respiratory infection)   . UTI (lower urinary tract infection)     Past Surgical History:  Procedure Laterality Date  . COLONOSCOPY    . svd     2 live births  . 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 TOTAL KNEE ARTHROPLASTY Right 10/31/2017   Procedure: RIGHT TOTAL KNEE ARTHROPLASTY;  Surgeon: Ollen GrossAluisio, Frank, MD;  Location: [facility];  Service: Orthopedics;  Laterality: Right;  50 mins    There were no vitals filed for this visit.  Subjective Assessment - 12/14/17 1354    Subjective  No new complaints.  Still just feeling stiff in the mornings    Pertinent History  Rt TKA 10/31/17; history of Lt TKA last year    Patient Stated Goals  be able to walk the campus or walk at the gym for 45 minutes    Currently in Pain?  No/denies                      [facility] Adult PT Treatment/Exercise - 12/14/17 0001      Knee/Hip Exercises: Aerobic   Stationary Bike  L0 x 8 min for ROM; last 2 minutes with ankle DF PT present for status update      Knee/Hip Exercises: Machines for Strengthening   Total Gym Leg Press  bilateral 70# - x20; single 3x10 35# SEAT 7; press into heels for improved knee extension      Knee/Hip Exercises: Standing   Knee Flexion  Strengthening;Right;Left;10 reps marching add 2.5#; cues for posture; stand on foam    Hip Abduction  Stengthening;Right;Left;20 reps;Knee straight add 2.5#; cues for posture and UE support x1; stand on foam    Forward Step Up  Right;Hand Hold: 0;10 reps;Step Height: 6";3 sets      Vasopneumatic   Number Minutes Vasopneumatic   15 minutes    Vasopnuematic Location   Knee    Vasopneumatic Pressure  Medium    Vasopneumatic Temperature   3 snow flakes               PT Short Term Goals - 12/05/17 1130      PT SHORT TERM GOAL #1   Title  independent with inital HEP    Time  4    Period  Weeks    Status  Achieved      PT SHORT TERM GOAL #2   Title  demonstrates even gait with LRAD    Time  4    Period  Weeks    Status  Achieved        PT Long Term Goals - 12/14/17 1117      PT LONG TERM GOAL #1   Title  independent with advanced HEP    Period  Weeks    Status  On-going      PT LONG TERM GOAL #2   Title  FOTO < or = to 37% limitation    Time  6    Period  Weeks    Status  On-going      PT LONG TERM GOAL #3   Title  improved knee extension to 0 degreee, and flexion to 120 degrees for improved gait    Time  6    Period  Weeks    Status  On-going      PT LONG TERM GOAL #4   Title  improved 5 x sit to stand to < or = to 12 seconds to demonstrate improved strength and reduced risk of falls    Time  6    Period  Weeks    Status  On-going      PT LONG TERM GOAL #5   Title  ind with advanced HEP and able to return to walking 20 minutes/day for return to walking for exercise    Time  6    Period  Weeks    Status  On-going            Plan - 12/14/17  1354    Clinical Impression Statement  Patient is doing well and continues to progress resistance.  She is able to add weight to standing exercises and leg press.  Pt continue to benefit from skilled PT to return to walking for exercise and functional activities.    PT Treatment/Interventions  ADLs/Self Care Home Management;Biofeedback;Cryotherapy;Electrical Stimulation;Moist Heat;Ultrasound;Gait training;Stair training;Functional mobility training;Therapeutic activities;Therapeutic exercise;Balance training;Neuromuscular re-education;Patient/family education;Manual techniques;Manual lymph drainage;Scar mobilization;Passive range of motion;Dry needling;Taping;Vasopneumatic Device    PT Next Visit Plan  right knee ROM and strengthening; bike, vasopneumatic, progress strengthening and ROM as tolerated    Consulted and Agree with Plan of Care  Patient       Patient will benefit from skilled therapeutic intervention in order to improve the following deficits and impairments:  Abnormal gait, Decreased activity tolerance, Decreased endurance, Pain, Postural dysfunction, Decreased strength, Decreased range of motion, Difficulty walking  Visit Diagnosis: Chronic pain of right knee  Stiffness of right knee, not elsewhere classified  Muscle weakness (generalized)  Stiffness of left knee, not elsewhere classified     Problem List Patient Active Problem List   Diagnosis Date Noted  . Anemia 11/01/2008  . Allergic rhinitis 11/01/2008  . Essential hypertension 09/03/2008  . OA (osteoarthritis) of knee 09/03/2008    Vincente Poli, PT 12/14/2017, 1:58 PM  [facility] [address], [address] [place], [place], [address] Phone: [phone]   Fax:  [phone]  Name: Paula Kelley MRN: [id number] Date of Birth: 1951-02-17

## 2017-12-19 ENCOUNTER — Ambulatory Visit: Payer: BC Managed Care – PPO | Attending: Orthopedic Surgery | Admitting: Physical Therapy

## 2017-12-19 DIAGNOSIS — M6281 Muscle weakness (generalized): Secondary | ICD-10-CM | POA: Insufficient documentation

## 2017-12-19 DIAGNOSIS — M25561 Pain in right knee: Secondary | ICD-10-CM | POA: Diagnosis not present

## 2017-12-19 DIAGNOSIS — M25662 Stiffness of left knee, not elsewhere classified: Secondary | ICD-10-CM | POA: Insufficient documentation

## 2017-12-19 DIAGNOSIS — G8929 Other chronic pain: Secondary | ICD-10-CM | POA: Insufficient documentation

## 2017-12-19 DIAGNOSIS — M25661 Stiffness of right knee, not elsewhere classified: Secondary | ICD-10-CM | POA: Diagnosis present

## 2017-12-19 NOTE — Therapy (Signed)
[facility] [address], [address] [place], [place], [address] Phone: [phone]   Fax:  [phone]  Physical Therapy Treatment  Patient Details  Name: Paula Kelley MRN: [id number] Date of Birth: 1951-04-12 Referring Provider: Ollen Gross   Encounter Date: 12/19/2017  PT End of Session - 12/19/17 1105    Visit Number  9    Date for PT Re-Evaluation  01/02/18    PT Start Time  1101    PT Stop Time  1155    PT Time Calculation (min)  54 min    Activity Tolerance  Patient tolerated treatment well    Behavior During Therapy  [facility] for tasks assessed/performed       Past Medical History:  Diagnosis Date  . Allergic rhinitis   . Anxiety   . DJD (degenerative joint disease)    Sees Dr. Dierdre Forth  . Headache(784.0)   . Hypertension   . Mild anemia    iron def, reports on iron her whole life  . Obesity   . URI (upper respiratory infection)   . UTI (lower urinary tract infection)     Past Surgical History:  Procedure Laterality Date  . COLONOSCOPY    . svd     2 live births  . 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 TOTAL KNEE ARTHROPLASTY Right 10/31/2017   Procedure: RIGHT TOTAL KNEE ARTHROPLASTY;  Surgeon: Ollen Gross, MD;  Location: [facility];  Service: Orthopedics;  Laterality: Right;  50 mins    There were no vitals filed for this visit.  Subjective Assessment - 12/19/17 1104    Subjective  Pt states she is doing well and still just feeling stiff    Pertinent History  Rt TKA 10/31/17; history of Lt TKA last year    Limitations  Other (comment)    Patient Stated Goals  be able to walk the campus or walk at the gym for 45 minutes    Currently in Pain?  No/denies         [facility] PT Assessment - 12/19/17 0001      AROM   Right Knee Extension  5    Right Knee  Flexion  111                  [facility] Adult PT Treatment/Exercise - 12/19/17 0001      Knee/Hip Exercises: Aerobic   Stationary Bike  L0 x 8 min for ROM; last 2 minutes with ankle DF PT present for status update      Knee/Hip Exercises: Machines for Strengthening   Total Gym Leg Press  bilateral 75# -x15, 85#- x15; single 3x10 35#      Knee/Hip Exercises: Standing   Knee Flexion  Strengthening;Right;Left;10 reps marching add 2.5#; cues for posture; stand on foam    Hip Abduction  Stengthening;Right;Left;20 reps;Knee straight add 2.5#; cues for posture and UE support x1; stand on foam    Forward Step Up  Right;Hand Hold: 0;10 reps;Step Height: 6";3 sets      Knee/Hip Exercises: Seated   Sit to Sand  10 reps;3 sets;without UE support on foam mat in chair      Knee/Hip Exercises: Supine   Other Supine Knee/Hip Exercises  knee flexion and extension stretches with PT overpressure      Vasopneumatic   Number Minutes Vasopneumatic  15 minutes    Vasopnuematic Location   Knee    Vasopneumatic Pressure  Medium    Vasopneumatic Temperature   3 snow flakes               PT Short Term Goals - 12/05/17 1130      PT SHORT TERM GOAL #1   Title  independent with inital HEP    Time  4    Period  Weeks    Status  Achieved      PT SHORT TERM GOAL #2   Title  demonstrates even gait with LRAD    Time  4    Period  Weeks    Status  Achieved        PT Long Term Goals - 12/19/17 1106      PT LONG TERM GOAL #1   Title  independent with advanced HEP    Time  6    Period  Weeks    Status  On-going      PT LONG TERM GOAL #2   Title  FOTO < or = to 37% limitation    Time  6    Period  Weeks    Status  On-going      PT LONG TERM GOAL #4   Title  improved 5 x sit to stand to < or = to 12 seconds to demonstrate improved strength and reduced risk of falls    Time  6    Period  Weeks    Status  On-going      PT LONG TERM GOAL #5   Title  ind with advanced HEP and  able to return to walking 20 minutes/day for return to walking for exercise    Baseline  15-20 minutes    Period  Weeks    Status  On-going            Plan - 12/19/17 1235    Clinical Impression Statement  Patient continues to show increased strength with increased resistance.  She has improved knee ROM and making progress toward functional goals.  Pt will benefit from one more session as recommended by her physician so she can successfully transition to HEP    PT Treatment/Interventions  ADLs/Self Care Home Management;Biofeedback;Cryotherapy;Electrical Stimulation;Moist Heat;Ultrasound;Gait training;Stair training;Functional mobility training;Therapeutic activities;Therapeutic exercise;Balance training;Neuromuscular re-education;Patient/family education;Manual techniques;Manual lymph drainage;Scar mobilization;Passive range of motion;Dry needling;Taping;Vasopneumatic Device    PT Next Visit Plan  FOTO, MMT, final HEP review and update    Consulted and Agree with Plan of Care  Patient       Patient will benefit from skilled therapeutic intervention in order to improve the following deficits and impairments:  Abnormal gait, Decreased activity tolerance, Decreased endurance, Pain, Postural dysfunction, Decreased strength, Decreased range of motion, Difficulty walking  Visit Diagnosis: Chronic pain of right knee  Stiffness of right knee, not elsewhere classified  Muscle weakness (generalized)  Stiffness of left knee, not elsewhere classified     Problem List Patient Active Problem List   Diagnosis Date Noted  . Anemia 11/01/2008  . Allergic rhinitis 11/01/2008  . Essential hypertension 09/03/2008  . OA (osteoarthritis) of knee 09/03/2008    Vincente PoliJakki Crosser, PT 12/19/2017, 2:13 PM  [facility] [address], [address] [place], [place], [address] Phone: [phone]   Fax:  [phone]  Name: Paula Kelley MRN:  [id number] Date of Birth: 1951/06/23

## 2017-12-21 ENCOUNTER — Ambulatory Visit: Payer: BC Managed Care – PPO | Admitting: Physical Therapy

## 2017-12-21 ENCOUNTER — Encounter: Payer: Self-pay | Admitting: Physical Therapy

## 2017-12-21 DIAGNOSIS — M25561 Pain in right knee: Secondary | ICD-10-CM | POA: Diagnosis not present

## 2017-12-21 DIAGNOSIS — M6281 Muscle weakness (generalized): Secondary | ICD-10-CM

## 2017-12-21 DIAGNOSIS — M25662 Stiffness of left knee, not elsewhere classified: Secondary | ICD-10-CM

## 2017-12-21 DIAGNOSIS — G8929 Other chronic pain: Secondary | ICD-10-CM

## 2017-12-21 DIAGNOSIS — M25661 Stiffness of right knee, not elsewhere classified: Secondary | ICD-10-CM

## 2017-12-21 NOTE — Therapy (Addendum)
[facility] [address] [place], [place], [address] Phone: [phone]   Fax:  [phone]  Physical Therapy Treatment  Patient Details  Name: Paula Kelley MRN: [id number] Date of Birth: Mar 23, 1951 Referring Provider: Gaynelle Arabian   Encounter Date: 12/21/2017  PT End of Session - 12/21/17 1056    Visit Number  10    Date for PT Re-Evaluation  01/02/18    PT Start Time  1053    PT Stop Time  1144    PT Time Calculation (min)  51 min    Activity Tolerance  Patient tolerated treatment well    Behavior During Therapy  [facility] for tasks assessed/performed       Past Medical History:  Diagnosis Date  . Allergic rhinitis   . Anxiety   . DJD (degenerative joint disease)    Sees Dr. Amil Amen  . Headache(784.0)   . Hypertension   . Mild anemia    iron def, reports on iron her whole life  . Obesity   . URI (upper respiratory infection)   . UTI (lower urinary tract infection)     Past Surgical History:  Procedure Laterality Date  . COLONOSCOPY    . svd     2 live births  . TOTAL KNEE ARTHROPLASTY Left 11/01/2016   Procedure: LEFT TOTAL KNEE ARTHROPLASTY;  Surgeon: Gaynelle Arabian, MD;  [facility]: [facility];  Service: Orthopedics;  Laterality: Left;  Adductor Block  . TOTAL KNEE ARTHROPLASTY Right 10/31/2017   Procedure: RIGHT TOTAL KNEE ARTHROPLASTY;  Surgeon: Gaynelle Arabian, MD;  [facility]: [facility];  Service: Orthopedics;  Laterality: Right;  50 mins    There were no vitals filed for this visit.  Subjective Assessment - 12/21/17 1055    Subjective  I feel good with exercises at home.  My knee was more sore last night, just stiff now.      Pertinent History  Rt TKA 10/31/17; history of Lt TKA last year    Limitations  Other (comment)    Patient Stated Goals  be able to walk the campus or walk at the gym for 45 minutes    Currently in Pain?  No/denies                      [facility] Adult PT Treatment/Exercise  - 12/21/17 0001      Knee/Hip Exercises: Aerobic   Stationary Bike  L0 x 5 min for ROM; last 2 minutes with ankle DF; L2 x 5 min for strength/endurance to demo how to use bike at home PT present for status update      Knee/Hip Exercises: Standing   Knee Flexion  Strengthening;Right;Left;15 reps marching add 2.5#; cues for posture; stand on foam    Hip Abduction  Stengthening;Right;Left;Knee straight;15 reps add 2.5#; cues for posture and UE support x1; stand on foam    Hip Extension  Stengthening;Right;Left;2 sets;10 reps 2.5 lb    Forward Step Up  Right;Hand Hold: 0;10 reps;Step Height: 6";3 sets      Knee/Hip Exercises: Seated   Sit to Sand  10 reps;3 sets;without UE support chair; 1 UE support      Vasopneumatic   Number Minutes Vasopneumatic   15 minutes    Vasopnuematic Location   Knee    Vasopneumatic Pressure  Medium    Vasopneumatic Temperature   3 snow flakes        Rt knee PROM Extension 4 degrees (less than neutral) Flexion  108 degrees       PT Short Term Goals - 12/21/17 1106      PT SHORT TERM GOAL #1   Title  independent with inital HEP    Time  4    Period  Weeks    Status  Achieved      PT SHORT TERM GOAL #2   Title  demonstrates even gait with LRAD    Time  4    Period  Weeks    Status  Achieved      PT SHORT TERM GOAL #3   Title  able to perform 5x sit to stand test without using UE support due to increased LE strength    Baseline  able to do with one UE    Time  4    Period  Weeks    Status  Partially Met        PT Long Term Goals - 12/21/17 1102      PT LONG TERM GOAL #1   Title  independent with advanced HEP    Time  6    Period  Weeks    Status  Achieved      PT LONG TERM GOAL #2   Title  FOTO < or = to 37% limitation    Baseline  25% limited    Time  6    Period  Weeks    Status  Achieved      PT LONG TERM GOAL #3   Title  improved knee extension to 0 degreee, and flexion to 120 degrees for improved gait    Baseline   4-108    Time  6    Period  Weeks    Status  Partially Met      PT LONG TERM GOAL #4   Title  improved 5 x sit to stand to < or = to 12 seconds to demonstrate improved strength and reduced risk of falls    Baseline  15 sec    Time  6    Period  Weeks    Status  Partially Met      PT LONG TERM GOAL #5   Title  ind with advanced HEP and able to return to walking 20 minutes/day for return to walking for exercise    Baseline  reports she is walking 20 minutes at a time around the house    Time  6    Period  Weeks    Status  Achieved            Plan - 12/21/17 1133    Clinical Impression Statement  Patient is aware of need to continue HEP.  She has achieved most of her functional goals and will continue to work on increased ROM and strengthening BLE at home in order to return to work in the spring.    PT Treatment/Interventions  ADLs/Self Care Home Management;Biofeedback;Cryotherapy;Electrical Stimulation;Moist Heat;Ultrasound;Gait training;Stair training;Functional mobility training;Therapeutic activities;Therapeutic exercise;Balance training;Neuromuscular re-education;Patient/family education;Manual techniques;Manual lymph drainage;Scar mobilization;Passive range of motion;Dry needling;Taping;Vasopneumatic Device    PT Next Visit Plan  discharged today    Consulted and Agree with Plan of Care  Patient       Patient will benefit from skilled therapeutic intervention in order to improve the following deficits and impairments:  Abnormal gait, Decreased activity tolerance, Decreased endurance, Pain, Postural dysfunction, Decreased strength, Decreased range of motion, Difficulty walking  Visit Diagnosis: Chronic pain of right knee  Stiffness of right knee, not elsewhere classified  Muscle  weakness (generalized)  Stiffness of left knee, not elsewhere classified     Problem List Patient Active Problem List   Diagnosis Date Noted  . Anemia 11/01/2008  . Allergic rhinitis  11/01/2008  . Essential hypertension 09/03/2008  . OA (osteoarthritis) of knee 09/03/2008    Zannie Cove, PT 12/21/2017, 2:09 PM  [facility] [address] [place], [place], [address] Phone: [phone]   Fax:  [phone]  Name: Paula Kelley MRN: [id number] Date of Birth: 21-Jun-1951  PHYSICAL THERAPY DISCHARGE SUMMARY  Visits from Start of Care: 10  Current functional level related to goals / functional outcomes: See above remaining goals   Remaining deficits: See above   Education / Equipment: HEP  Plan: Patient agrees to discharge.  Patient goals were partially met. Patient is being discharged due to being pleased with the current functional level.  ?????     [employer], PT 12/21/17 2:11 PM

## 2017-12-24 ENCOUNTER — Other Ambulatory Visit: Payer: Self-pay | Admitting: Family Medicine

## 2017-12-26 ENCOUNTER — Encounter: Payer: BC Managed Care – PPO | Admitting: Physical Therapy

## 2017-12-28 ENCOUNTER — Encounter: Payer: BC Managed Care – PPO | Admitting: Physical Therapy

## 2018-02-02 ENCOUNTER — Encounter: Payer: Self-pay | Admitting: Family Medicine

## 2018-02-02 ENCOUNTER — Ambulatory Visit (INDEPENDENT_AMBULATORY_CARE_PROVIDER_SITE_OTHER): Payer: BC Managed Care – PPO | Admitting: Family Medicine

## 2018-02-02 VITALS — BP 122/82 | HR 89 | Temp 98.0°F | Ht 63.5 in | Wt 173.5 lb

## 2018-02-02 DIAGNOSIS — Z1331 Encounter for screening for depression: Secondary | ICD-10-CM | POA: Diagnosis not present

## 2018-02-02 DIAGNOSIS — Z0000 Encounter for general adult medical examination without abnormal findings: Secondary | ICD-10-CM

## 2018-02-02 DIAGNOSIS — I10 Essential (primary) hypertension: Secondary | ICD-10-CM | POA: Diagnosis not present

## 2018-02-02 DIAGNOSIS — J309 Allergic rhinitis, unspecified: Secondary | ICD-10-CM

## 2018-02-02 NOTE — Progress Notes (Signed)
ere for CPE/AWV: Due for labs  Chronic medical problems summarized below were reviewed for changes. Reports doing well. No complaints today.  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 had this her whole life and takesiron  Allergic Rhinitis: -chronic PND, sinus issues -hx allergies and had allergy shots in the past -meds: xyzal, flonase   -Diet: variety of foods, balance and well rounded, larger portion sizes -Exercise: no regular exercise -Taking folic acid, vitamin D or calcium: no -Diabetes and Dyslipidemia Screening: due for labs, fasting today -Vaccines: see vaccine section EPIC -pap history: sees Carrington ClampMichelle Horvath - last exam 08/2016 -FDLMP: see nursing notes -sexual activity: yes, female partner, no new partners -wants STI testing (Hep C if born [birth year]-65): no -FH breast, colon or ovarian ca: see FH Last mammogram: 09/2017 with Dr. Henderson CloudHorvath per epic Last colon cancer screening: colonosocpy with Dr. Marina GoodellPerry 04/2016 normal, 10 year advised Breast Ca Risk Assessment: see family history and pt history DEXA (>/= 65): normal 09/2016  -Alcohol, Tobacco, drug use: see social history  Review of Systems - no fevers, unintentional weight loss, vision loss, hearing loss, chest pain, sob, hemoptysis, melena, hematochezia, hematuria, genital discharge, changing or concerning skin lesions, bleeding, bruising, loc, thoughts of self harm or SI    Past Medical History:  Diagnosis Date  . Allergic rhinitis   . Anxiety   . DJD (degenerative joint disease)    Sees Dr. Dierdre ForthBeekman  . Headache(784.0)   . Hypertension   . Mild anemia    iron def, reports on iron her whole life  . Obesity   . URI (upper respiratory infection)   . UTI (lower urinary tract infection)     Past Surgical History:  Procedure Laterality Date  . COLONOSCOPY    . svd     2 live  births  . 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 TOTAL KNEE ARTHROPLASTY Right 10/31/2017   Procedure: RIGHT TOTAL KNEE ARTHROPLASTY;  Surgeon: Ollen GrossAluisio, Frank, MD;  Location: [facility];  Service: Orthopedics;  Laterality: Right;  50 mins    Family History  Problem Relation Age of Onset  . Heart disease Father   . Heart disease Mother   . Colon cancer Neg Hx     Social History   Socioeconomic History  . Marital status: Married    Spouse name: Not on file  . Number of children: Not on file  . Years of education: Not on file  . Highest education level: Not on file  Occupational History  . Occupation: retired  Engineer, productionocial Needs  . Financial resource strain: Not on file  . Food insecurity:    Worry: Not on file    Inability: Not on file  . Transportation needs:    Medical: Not on file    Non-medical: Not on file  Tobacco Use  . Smoking status: Never Smoker  . Smokeless tobacco: Never Used  Substance and Sexual Activity  . Alcohol use: Yes    Comment: social use  . Drug use: No  . Sexual activity: Not on file  Lifestyle  . Physical activity:    Days per week: Not on file    Minutes per session: Not on file  . Stress: Not on file  Relationships  . Social connections:    Talks on phone: Not on file    Gets together: Not on file    Attends religious service: Not on file    Active member of club or organization: Not on file    Attends meetings of clubs or organizations: Not on file    Relationship status: Not on file  Other Topics Concern  . Not on file  Social History Narrative   Work or School: works at [employer] and T at [employer] Situation: lives with husband      Spiritual Beliefs: methodist      Lifestyle: walks; trying to eat a healthy diet              Current Outpatient Medications:  .  amLODipine-benazepril  (LOTREL) 5-20 MG capsule, TAKE 1 CAPSULE BY MOUTH DAILY, Disp: 30 capsule, Rfl: 5 .  aspirin 81 MG tablet, Take 81 mg by mouth daily., Disp: , Rfl:  .  ferrous sulfate 325 (65 FE) MG tablet, Take 325 mg by mouth daily with breakfast., Disp: , Rfl:  .  fluticasone (FLONASE) 50 MCG/ACT nasal spray, Place 2 sprays daily as needed into both nostrils (for allergies/sinus issues). (Patient taking differently: Place 2 sprays into both nostrils daily as needed for allergies. ), Disp: 16 g, Rfl: 3 .  guaiFENesin (MUCINEX) 600 MG 12 hr tablet, TAKE 1-2 TABLETS BY MOUTH TWICE DAILY AS NEEDED (Patient taking differently: Take 600 mg by mouth daily as needed (for allergies/sinus issues). ), Disp: 120 tablet, Rfl: 1 .  levocetirizine (XYZAL) 2.5 MG/5ML solution, Take 5 mLs (2.5 mg total) daily as needed by mouth (for allergies/sinus issues)., Disp: 148 mL, Rfl: 3 .  Potassium 99 MG TABS, Take 99 mg by mouth daily., Disp: , Rfl:  .  psyllium (METAMUCIL) 58.6 % powder, Take 1 packet by mouth daily at 2 PM. , Disp: , Rfl:   EXAM:  Vitals:   02/02/18 1507  BP: 122/82  Pulse: 89  Temp: 98 F (36.7 C)  SpO2: 98%    GENERAL: vitals reviewed and listed below, alert, oriented, appears well hydrated and in no acute distress  HEENT: head atraumatic, PERRLA, normal appearance of eyes, ears, nose and mouth. moist mucus membranes.  NECK: supple, no masses or lymphadenopathy  LUNGS: clear to auscultation bilaterally, no rales, rhonchi or wheeze  CV: HRRR, no peripheral edema or cyanosis, normal pedal pulses  ABDOMEN: bowel sounds normal, soft, non tender to palpation, no masses, no rebound or guarding  GU/BREAST: declined, does with gyn  SKIN: no rash or abnormal lesions  MS: normal gait, moves all extremities normally  NEURO: normal gait, speech and thought processing grossly intact, muscle tone grossly intact throughout  PSYCH: normal affect, pleasant and cooperative  ASSESSMENT AND PLAN:  Discussed  the following assessment and plan:  PREVENTIVE EXAM: -Discussed and advised all [place] preventive services health task force level A and B recommendations for age, sex and risks. -Advised at least 150 minutes of exercise per week and a healthy diet  -discussed shingles vaccnie -labs, studies and vaccines per orders this encounter  2. Screening for depression -negative  3. Essential hypertension -continue medication, labs  4. Allergic rhinitis, unspecified seasonality, unspecified trigger -stable   Patient advised to return to clinic immediately if symptoms worsen or persist or new concerns.  There are no Patient Instructions on file for this visit.  No follow-ups on file.  Terressa Koyanagi, DO

## 2018-02-02 NOTE — Addendum Note (Signed)
Addended by: Bonnye FavaKWEI, NANA K on: 02/02/2018 03:57 PM   Modules accepted: Orders

## 2018-02-02 NOTE — Patient Instructions (Signed)
BEFORE YOU LEAVE: -labs -follow up: 3-4 months  We have ordered labs or studies at this visit. It can take up to 1-2 weeks for results and processing. IF results require follow up or explanation, we will call you with instructions. Clinically stable results will be released to your [facility]. If you have not heard from [place] or cannot find your results in [facility] in 2 weeks please contact our office at [phone].  If you are not yet signed up for [facility], please consider signing up.   Preventive Care 67 Years and Older, Female Preventive care refers to lifestyle choices and visits with your health care provider that can promote health and wellness. What does preventive care include?  A yearly physical exam. This is also called an annual well check.  Dental exams once or twice a year.  Routine eye exams. Ask your health care provider how often you should have your eyes checked.  Personal lifestyle choices, including: ? Daily care of your teeth and gums. ? Regular physical activity. ? Eating a healthy diet. ? Avoiding tobacco and drug use. ? Limiting alcohol use. ? Practicing safe sex. ? Taking low-dose aspirin every day. ? Taking vitamin and mineral supplements as recommended by your health care provider. What happens during an annual well check? The services and screenings done by your health care provider during your annual well check will depend on your age, overall health, lifestyle risk factors, and family history of disease. Counseling Your health care provider may ask you questions about your:  Alcohol use.  Tobacco use.  Drug use.  Emotional well-being.  Home and relationship well-being.  Sexual activity.  Eating habits.  History of falls.  Memory and ability to understand (cognition).  Work and work Statistician.  Reproductive health.  Screening You may have the following tests or measurements:  Height, weight, and BMI.  Blood pressure.  Lipid and  cholesterol levels. These may be checked every 5 years, or more frequently if you are over 34 years old.  Skin check.  Lung cancer screening. You may have this screening every year starting at age 67 if you have a 30-pack-year history of smoking and currently smoke or have quit within the past 15 years.  Fecal occult blood test (FOBT) of the stool. You may have this test every year starting at age 67.  Flexible sigmoidoscopy or colonoscopy. You may have a sigmoidoscopy every 5 years or a colonoscopy every 10 years starting at age 67.  Hepatitis C blood test.  Hepatitis B blood test.  Sexually transmitted disease (STD) testing.  Diabetes screening. This is done by checking your blood sugar (glucose) after you have not eaten for a while (fasting). You may have this done every 1-3 years.  Bone density scan. This is done to screen for osteoporosis. You may have this done starting at age 67.  Mammogram. This may be done every 1-2 years. Talk to your health care provider about how often you should have regular mammograms.  Talk with your health care provider about your test results, treatment options, and if necessary, the need for more tests. Vaccines Your health care provider may recommend certain vaccines, such as:  Influenza vaccine. This is recommended every year.  Tetanus, diphtheria, and acellular pertussis (Tdap, Td) vaccine. You may need a Td booster every 10 years.  Varicella vaccine. You may need this if you have not been vaccinated.  Zoster vaccine. You may need this after age 67.  Measles, mumps, and rubella (MMR)  vaccine. You may need at least one dose of MMR if you were born in 1957 or later. You may also need a second dose.  Pneumococcal 13-valent conjugate (PCV13) vaccine. One dose is recommended after age 67.  Pneumococcal polysaccharide (PPSV23) vaccine. One dose is recommended after age 67.  Meningococcal vaccine. You may need this if you have certain  conditions.  Hepatitis A vaccine. You may need this if you have certain conditions or if you travel or work in places where you may be exposed to hepatitis A.  Hepatitis B vaccine. You may need this if you have certain conditions or if you travel or work in places where you may be exposed to hepatitis B.  Haemophilus influenzae type b (Hib) vaccine. You may need this if you have certain conditions.  Talk to your health care provider about which screenings and vaccines you need and how often you need them. This information is not intended to replace advice given to you by your health care provider. Make sure you discuss any questions you have with your health care provider. Document Released: 10/31/2015 Document Revised: 06/23/2016 Document Reviewed: 08/05/2015 Elsevier Interactive Patient Education  [employer].

## 2018-02-03 LAB — HEMOGLOBIN A1C
EAG (MMOL/L): 5.5 (calc)
HEMOGLOBIN A1C: 5.1 %{Hb} (ref <5.7)
MEAN PLASMA GLUCOSE: 100 (calc)

## 2018-02-03 LAB — BASIC METABOLIC PANEL
BUN: 10 mg/dL (ref 7–25)
CO2: 29 mmol/L (ref 20–32)
Calcium: 10 mg/dL (ref 8.6–10.4)
Chloride: 99 mmol/L (ref 98–110)
Creat: 0.71 mg/dL (ref 0.50–0.99)
Glucose, Bld: 83 mg/dL (ref 65–99)
POTASSIUM: 3.7 mmol/L (ref 3.5–5.3)
SODIUM: 137 mmol/L (ref 135–146)

## 2018-02-03 LAB — CBC WITH DIFFERENTIAL/PLATELET
BASOS ABS: 11 {cells}/uL (ref 0–200)
Basophils Relative: 0.2 %
Eosinophils Absolute: 0 cells/uL — ABNORMAL LOW (ref 15–500)
Eosinophils Relative: 0 %
HCT: 35.5 % (ref 35.0–45.0)
Hemoglobin: 11.8 g/dL (ref 11.7–15.5)
Lymphs Abs: 2010 cells/uL (ref 850–3900)
MCH: 28.2 pg (ref 27.0–33.0)
MCHC: 33.2 g/dL (ref 32.0–36.0)
MCV: 84.7 fL (ref 80.0–100.0)
MONOS PCT: 8.2 %
MPV: 10.3 fL (ref 7.5–12.5)
NEUTROS PCT: 55.7 %
Neutro Abs: 3119 cells/uL (ref 1500–7800)
PLATELETS: 295 10*3/uL (ref 140–400)
RBC: 4.19 10*6/uL (ref 3.80–5.10)
RDW: 12.7 % (ref 11.0–15.0)
TOTAL LYMPHOCYTE: 35.9 %
WBC mixed population: 459 cells/uL (ref 200–950)
WBC: 5.6 10*3/uL (ref 3.8–10.8)

## 2018-02-03 LAB — LIPID PANEL
Cholesterol: 157 mg/dL (ref <200)
HDL: 65 mg/dL (ref >50)
LDL Cholesterol (Calc): 79 mg/dL (calc)
NON-HDL CHOLESTEROL (CALC): 92 mg/dL (ref <130)
TRIGLYCERIDES: 57 mg/dL (ref <150)
Total CHOL/HDL Ratio: 2.4 (calc) (ref <5.0)

## 2018-05-27 ENCOUNTER — Other Ambulatory Visit: Payer: Self-pay | Admitting: Family Medicine

## 2018-06-06 ENCOUNTER — Encounter: Payer: Self-pay | Admitting: Family Medicine

## 2018-06-06 ENCOUNTER — Ambulatory Visit: Payer: BC Managed Care – PPO | Admitting: Family Medicine

## 2018-06-06 VITALS — BP 112/80 | HR 76 | Temp 97.8°F | Ht 63.5 in | Wt 180.1 lb

## 2018-06-06 DIAGNOSIS — E669 Obesity, unspecified: Secondary | ICD-10-CM | POA: Diagnosis not present

## 2018-06-06 DIAGNOSIS — I10 Essential (primary) hypertension: Secondary | ICD-10-CM | POA: Diagnosis not present

## 2018-06-06 DIAGNOSIS — D649 Anemia, unspecified: Secondary | ICD-10-CM

## 2018-06-06 DIAGNOSIS — K5900 Constipation, unspecified: Secondary | ICD-10-CM

## 2018-06-06 NOTE — Patient Instructions (Signed)
BEFORE YOU LEAVE: -follow up: 3- 4 months  Mirialax for 5-7 days when stopped up  Daily fiber supplement or high fiber diet   We recommend the following healthy lifestyle for LIFE: 1) Small portions. But, make sure to get regular (at least 3 per day), healthy meals and small healthy snacks if needed.  2) Eat a healthy clean diet.   TRY TO EAT: -at least 5-7 servings of low sugar, colorful, and nutrient rich vegetables per day (not corn, potatoes or bananas.) -berries are the best choice if you wish to eat fruit (only eat small amounts if trying to reduce weight)  -lean meets (fish, white meat of chicken or [place]) -vegan proteins for some meals - beans or tofu, whole grains, nuts and seeds -Replace bad fats with good fats - good fats include: fish, nuts and seeds, canola oil, olive oil -small amounts of low fat or non fat dairy -small amounts of100 % whole grains - check the lables -drink plenty of water  AVOID: -SUGAR, sweets, anything with added sugar, corn syrup or sweeteners - must read labels as even foods advertised as "healthy" often are loaded with sugar -if you must have a sweetener, small amounts of stevia may be best -sweetened beverages and artificially sweetened beverages -simple starches (rice, bread, potatoes, pasta, chips, etc - small amounts of 100% whole grains are ok) -red meat, pork, butter -fried foods, fast food, processed food, excessive dairy, eggs and coconut.  3)Get at least 150 minutes of sweaty aerobic exercise per week.  4)Reduce stress - consider counseling, meditation and relaxation to balance other aspects of your life.

## 2018-06-06 NOTE — Progress Notes (Signed)
HPI:  Using dictation device. Unfortunately this device frequently misinterprets words/phrases.  Voncille LoOlga L Mcevers is a pleasant 67 y.o. here for follow up. Chronic medical problems summarized below were reviewed for changes and stability and were updated as needed below. These issues and their treatment remain stable for the most part.  Denies CP, SOB, DOE, treatment intolerance or new symptoms.   HTN: -meds: amlodipine-benazepril 5-20mg   Obesity: -she reports did good with recovery from the knee surgery, worked hard with the PT -she is afraid to get back to her regular walking routine  Iron def anemia: -reports she has had this her whole life and takesiron, sometimes causes constipation  Allergic Rhinitis: -chronic PND, sinus issues -hx allergies and had allergy shots in the past -meds: xyzal, flonase   ROS: See pertinent positives and negatives per HPI.  Past Medical History:  Diagnosis Date  . Allergic rhinitis   . Anxiety   . DJD (degenerative joint disease)    Sees Dr. Dierdre ForthBeekman  . Headache(784.0)   . Hypertension   . Mild anemia    iron def, reports on iron her whole life  . Obesity   . URI (upper respiratory infection)   . UTI (lower urinary tract infection)     Past Surgical History:  Procedure Laterality Date  . COLONOSCOPY    . svd     2 live births  . 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 TOTAL KNEE ARTHROPLASTY Right 10/31/2017   Procedure: RIGHT TOTAL KNEE ARTHROPLASTY;  Surgeon: Ollen GrossAluisio, Frank, MD;  Location: [facility];  Service: Orthopedics;  Laterality: Right;  50 mins    Family History  Problem Relation Age of Onset  . Heart disease Father   . Heart disease Mother   . Colon cancer Neg Hx     SOCIAL HX: see hpi   Current Outpatient Medications:  .  amLODipine-benazepril (LOTREL) 5-20 MG  capsule, TAKE 1 CAPSULE BY MOUTH DAILY, Disp: 30 capsule, Rfl: 5 .  aspirin 81 MG tablet, Take 81 mg by mouth daily., Disp: , Rfl:  .  ferrous sulfate 325 (65 FE) MG tablet, Take 325 mg by mouth daily with breakfast., Disp: , Rfl:  .  fluticasone (FLONASE) 50 MCG/ACT nasal spray, Place 2 sprays daily as needed into both nostrils (for allergies/sinus issues). (Patient taking differently: Place 2 sprays into both nostrils daily as needed for allergies. ), Disp: 16 g, Rfl: 3 .  guaiFENesin (MUCINEX) 600 MG 12 hr tablet, TAKE 1-2 TABLETS BY MOUTH TWICE DAILY AS NEEDED (Patient taking differently: Take 600 mg by mouth daily as needed (for allergies/sinus issues). ), Disp: 120 tablet, Rfl: 1 .  levocetirizine (XYZAL) 2.5 MG/5ML solution, Take 5 mLs (2.5 mg total) daily as needed by mouth (for allergies/sinus issues)., Disp: 148 mL, Rfl: 3 .  Potassium 99 MG TABS, Take 99 mg by mouth daily., Disp: , Rfl:  .  psyllium (METAMUCIL) 58.6 % powder, Take 1 packet by mouth daily at 2 PM. , Disp: , Rfl:   EXAM:  Vitals:   06/06/18 1613  BP: 112/80  Pulse: 76  Temp: 97.8 F (36.6 C)    Body mass index is 31.4 kg/m.  GENERAL: vitals reviewed and listed above, alert, oriented, appears well hydrated and in no acute distress  HEENT: atraumatic, conjunttiva clear, no obvious abnormalities on inspection of external nose  and ears  NECK: no obvious masses on inspection  LUNGS: clear to auscultation bilaterally, no wheezes, rales or rhonchi, good air movement  CV: HRRR, no peripheral edema  MS: moves all extremities without noticeable abnormality  PSYCH: pleasant and cooperative, no obvious depression or anxiety  ASSESSMENT AND PLAN:  Discussed the following assessment and plan:  Constipation, unspecified constipation type  Essential hypertension  Anemia, unspecified type  Obesity (BMI 30-39.9)  -discussed different formulations of iron and options for prevention and tx of constipation, high  fiber diet, water, exercise and as needed mirilax -advise she ask her orthopedic about her concerns about walking on her knees as it is likely ok to get back to gentle aerobic exercise and would be good for her overall health -she preferred to skip labs today and check at follow up -lifestyle recs for wt reduction and good health -f/u 3-4 months -Patient advised to return or notify a doctor immediately if symptoms worsen or persist or new concerns arise.  Patient Instructions  BEFORE YOU LEAVE: -follow up: 3- 4 months  Mirialax for 5-7 days when stopped up  Daily fiber supplement or high fiber diet   We recommend the following healthy lifestyle for LIFE: 1) Small portions. But, make sure to get regular (at least 3 per day), healthy meals and small healthy snacks if needed.  2) Eat a healthy clean diet.   TRY TO EAT: -at least 5-7 servings of low sugar, colorful, and nutrient rich vegetables per day (not corn, potatoes or bananas.) -berries are the best choice if you wish to eat fruit (only eat small amounts if trying to reduce weight)  -lean meets (fish, white meat of chicken or [place]) -vegan proteins for some meals - beans or tofu, whole grains, nuts and seeds -Replace bad fats with good fats - good fats include: fish, nuts and seeds, canola oil, olive oil -small amounts of low fat or non fat dairy -small amounts of100 % whole grains - check the lables -drink plenty of water  AVOID: -SUGAR, sweets, anything with added sugar, corn syrup or sweeteners - must read labels as even foods advertised as "healthy" often are loaded with sugar -if you must have a sweetener, small amounts of stevia may be best -sweetened beverages and artificially sweetened beverages -simple starches (rice, bread, potatoes, pasta, chips, etc - small amounts of 100% whole grains are ok) -red meat, pork, butter -fried foods, fast food, processed food, excessive dairy, eggs and coconut.  3)Get at least 150  minutes of sweaty aerobic exercise per week.  4)Reduce stress - consider counseling, meditation and relaxation to balance other aspects of your life.    Terressa KoyanagiHannah R Mialee Weyman, DO

## 2018-09-21 ENCOUNTER — Encounter: Payer: Self-pay | Admitting: Family Medicine

## 2018-09-21 ENCOUNTER — Ambulatory Visit: Payer: BC Managed Care – PPO | Admitting: Family Medicine

## 2018-09-21 VITALS — BP 100/78 | HR 78 | Temp 97.9°F | Ht 63.5 in | Wt 179.4 lb

## 2018-09-21 DIAGNOSIS — E669 Obesity, unspecified: Secondary | ICD-10-CM

## 2018-09-21 DIAGNOSIS — I10 Essential (primary) hypertension: Secondary | ICD-10-CM

## 2018-09-21 DIAGNOSIS — D649 Anemia, unspecified: Secondary | ICD-10-CM | POA: Diagnosis not present

## 2018-09-21 DIAGNOSIS — K5900 Constipation, unspecified: Secondary | ICD-10-CM | POA: Diagnosis not present

## 2018-09-21 NOTE — Progress Notes (Signed)
HPI:  Using dictation device. Unfortunately this device frequently misinterprets words/phrases.  Paula Kelley is a pleasant 67 y.o. here for follow up. Chronic medical problems summarized below were reviewed for changes and stability and were updated as needed below. These issues and their treatment remain stable for the most part. Reports doing well. Getting [id number] steps per day. Trying to eat healthy.  Denies CP, SOB, DOE, treatment intolerance or new symptoms. AWV 01/2018  HTN: -meds: amlodipine-benazepril 5-20mg   Obesity: -shereports did good with recovery from the knee surgery, worked hard with the PT -she is afraid to get back to her regular walking routine  Iron def anemia: -reports she has had this her whole life and takesiron, sometimes causes constipation  Allergic Rhinitis: -chronic PND, sinus issues -hx allergies and had allergy shots in the past -meds: xyzal, flonase  ROS: See pertinent positives and negatives per HPI.  Past Medical History:  Diagnosis Date  . Allergic rhinitis   . Anxiety   . DJD (degenerative joint disease)    Sees Dr. Dierdre ForthBeekman  . Headache(784.0)   . Hypertension   . Mild anemia    iron def, reports on iron her whole life  . Obesity   . URI (upper respiratory infection)   . UTI (lower urinary tract infection)     Past Surgical History:  Procedure Laterality Date  . COLONOSCOPY    . svd     2 live births  . 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 TOTAL KNEE ARTHROPLASTY Right 10/31/2017   Procedure: RIGHT TOTAL KNEE ARTHROPLASTY;  Surgeon: Ollen GrossAluisio, Frank, MD;  Location: [facility];  Service: Orthopedics;  Laterality: Right;  50 mins    Family History  Problem Relation Age of Onset  . Heart disease Father   . Heart disease Mother   . Colon cancer Neg Hx     SOCIAL HX: see  hpi   Current Outpatient Medications:  .  amLODipine-benazepril (LOTREL) 5-20 MG capsule, TAKE 1 CAPSULE BY MOUTH DAILY, Disp: 30 capsule, Rfl: 5 .  aspirin 81 MG tablet, Take 81 mg by mouth daily., Disp: , Rfl:  .  ferrous sulfate 325 (65 FE) MG tablet, Take 325 mg by mouth daily with breakfast., Disp: , Rfl:  .  fluticasone (FLONASE) 50 MCG/ACT nasal spray, Place 2 sprays daily as needed into both nostrils (for allergies/sinus issues). (Patient taking differently: Place 2 sprays into both nostrils daily as needed for allergies. ), Disp: 16 g, Rfl: 3 .  guaiFENesin (MUCINEX) 600 MG 12 hr tablet, TAKE 1-2 TABLETS BY MOUTH TWICE DAILY AS NEEDED (Patient taking differently: Take 600 mg by mouth daily as needed (for allergies/sinus issues). ), Disp: 120 tablet, Rfl: 1 .  levocetirizine (XYZAL) 2.5 MG/5ML solution, Take 5 mLs (2.5 mg total) daily as needed by mouth (for allergies/sinus issues)., Disp: 148 mL, Rfl: 3 .  OVER THE COUNTER MEDICATION, Natural fiber supplement daily, Disp: , Rfl:  .  Potassium 99 MG TABS, Take 99 mg by mouth daily., Disp: , Rfl:   EXAM:  Vitals:   09/21/18 1624  BP: 100/78  Pulse: 78  Temp: 97.9 F (36.6 C)    Body mass index is 31.28 kg/m.  GENERAL: vitals reviewed and listed above, alert, oriented, appears well hydrated and in no acute distress  HEENT: atraumatic, conjunttiva clear, no obvious abnormalities on  inspection of external nose and ears  NECK: no obvious masses on inspection  LUNGS: clear to auscultation bilaterally, no wheezes, rales or rhonchi, good air movement  CV: HRRR, no peripheral edema  MS: moves all extremities without noticeable abnormality  PSYCH: pleasant and cooperative, no obvious depression or anxiety  ASSESSMENT AND PLAN:  Discussed the following assessment and plan:  Essential hypertension - Plan: Basic metabolic panel, CBC  Constipation, unspecified constipation type  Anemia, unspecified type  Obesity (BMI  30-39.9)  -labs per orders -lifestyle recs - discussed at length -cont current meds -follow up April or may for CPE, sooner as needed  Patient Instructions  BEFORE YOU LEAVE: -labs -follow up: Annual exam in April or may when due - fasting labs then  We have ordered labs or studies at this visit. It can take up to 1-2 weeks for results and processing. IF results require follow up or explanation, we will call you with instructions. Clinically stable results will be released to your [facility]. If you have not heard from [place] or cannot find your results in [facility] in 2 weeks please contact our office at [phone].  If you are not yet signed up for [facility], please consider signing up.   We recommend the following healthy lifestyle for LIFE: 1) Small portions. But, make sure to get regular (at least 3 per day), healthy meals and small healthy snacks if needed.  2) Eat a healthy clean diet.   TRY TO EAT: -at least 5-7 servings of low sugar, colorful, and nutrient rich vegetables per day (not corn, potatoes or bananas.) -berries are the best choice if you wish to eat fruit (only eat small amounts if trying to reduce weight)  -lean meets (fish, white meat of chicken or [place]) -vegan proteins for some meals - beans or tofu, whole grains, nuts and seeds -Replace bad fats with good fats - good fats include: fish, nuts and seeds, canola oil, olive oil -small amounts of low fat or non fat dairy -small amounts of100 % whole grains - check the lables -drink plenty of water  AVOID: -SUGAR, sweets, anything with added sugar, corn syrup or sweeteners - must read labels as even foods advertised as "healthy" often are loaded with sugar -if you must have a sweetener, small amounts of stevia may be best -sweetened beverages and artificially sweetened beverages -simple starches (rice, bread, potatoes, pasta, chips, etc - small amounts of 100% whole grains are ok) -red meat, pork, butter -fried foods,  fast food, processed food, excessive dairy, eggs and coconut.  3)Get at least 150 minutes of sweaty aerobic exercise per week.  4)Reduce stress - consider counseling, meditation and relaxation to balance other aspects of your life.          Terressa Koyanagi, DO

## 2018-09-21 NOTE — Patient Instructions (Signed)
BEFORE YOU LEAVE: -labs -follow up: Annual exam in April or may when due - fasting labs then  We have ordered labs or studies at this visit. It can take up to 1-2 weeks for results and processing. IF results require follow up or explanation, we will call you with instructions. Clinically stable results will be released to your [facility]. If you have not heard from [place] or cannot find your results in [facility] in 2 weeks please contact our office at [phone].  If you are not yet signed up for [facility], please consider signing up.   We recommend the following healthy lifestyle for LIFE: 1) Small portions. But, make sure to get regular (at least 3 per day), healthy meals and small healthy snacks if needed.  2) Eat a healthy clean diet.   TRY TO EAT: -at least 5-7 servings of low sugar, colorful, and nutrient rich vegetables per day (not corn, potatoes or bananas.) -berries are the best choice if you wish to eat fruit (only eat small amounts if trying to reduce weight)  -lean meets (fish, white meat of chicken or [place]) -vegan proteins for some meals - beans or tofu, whole grains, nuts and seeds -Replace bad fats with good fats - good fats include: fish, nuts and seeds, canola oil, olive oil -small amounts of low fat or non fat dairy -small amounts of100 % whole grains - check the lables -drink plenty of water  AVOID: -SUGAR, sweets, anything with added sugar, corn syrup or sweeteners - must read labels as even foods advertised as "healthy" often are loaded with sugar -if you must have a sweetener, small amounts of stevia may be best -sweetened beverages and artificially sweetened beverages -simple starches (rice, bread, potatoes, pasta, chips, etc - small amounts of 100% whole grains are ok) -red meat, pork, butter -fried foods, fast food, processed food, excessive dairy, eggs and coconut.  3)Get at least 150 minutes of sweaty aerobic exercise per week.  4)Reduce stress - consider  counseling, meditation and relaxation to balance other aspects of your life.

## 2018-09-22 LAB — BASIC METABOLIC PANEL
BUN: 10 mg/dL (ref 6–23)
CHLORIDE: 101 meq/L (ref 96–112)
CO2: 31 meq/L (ref 19–32)
CREATININE: 0.79 mg/dL (ref 0.40–1.20)
Calcium: 10.3 mg/dL (ref 8.4–10.5)
GFR: 93.32 mL/min (ref >60.00)
Glucose, Bld: 92 mg/dL (ref 70–99)
POTASSIUM: 4.1 meq/L (ref 3.5–5.1)
Sodium: 139 mEq/L (ref 135–145)

## 2018-09-22 LAB — CBC
HCT: 37.1 % (ref 36.0–46.0)
HEMOGLOBIN: 12 g/dL (ref 12.0–15.0)
MCHC: 32.5 g/dL (ref 30.0–36.0)
MCV: 88.4 fl (ref 78.0–100.0)
Platelets: 298 10*3/uL (ref 150.0–400.0)
RBC: 4.19 Mil/uL (ref 3.87–5.11)
RDW: 14.1 % (ref 11.5–15.5)
WBC: 6.1 10*3/uL (ref 4.0–10.5)

## 2018-10-06 LAB — HM PAP SMEAR

## 2018-12-01 ENCOUNTER — Ambulatory Visit: Payer: BC Managed Care – PPO | Admitting: Family Medicine

## 2018-12-01 VITALS — BP 130/90 | HR 68 | Temp 97.5°F | Wt 182.0 lb

## 2018-12-01 DIAGNOSIS — B86 Scabies: Secondary | ICD-10-CM | POA: Diagnosis not present

## 2018-12-01 MED ORDER — PERMETHRIN 5 % EX CREA: 1.0000 "application " | TOPICAL_CREAM | Freq: Once | CUTANEOUS | 0 refills | Status: AC

## 2018-12-01 MED ORDER — HYDROXYZINE HCL 25 MG PO TABS: 25.0000 mg | ORAL_TABLET | Freq: Three times a day (TID) | ORAL | 0 refills | Status: DC | PRN

## 2018-12-01 NOTE — Patient Instructions (Signed)
Scabies, Adult  Scabies is a skin condition that happens when very small insects get under the skin (infestation). This causes a rash and severe itchiness. Scabies can spread from person to person (is contagious). If you get scabies, it is common for others in your household to get scabies too.  With proper treatment, symptoms usually go away in 2-4 weeks. Scabies usually does not cause lasting problems.  What are the causes?  This condition is caused by mites (Sarcoptes scabiei, or human itch mites) that can only be seen with a microscope. The mites get into the top layer of skin and lay eggs. Scabies can spread from person to person through:  · Close contact with a person who has scabies.  · Contact with infested items, such as towels, bedding, or clothing.  What increases the risk?  This condition is more likely to develop in:  · People who live in nursing homes and other extended-care facilities.  · People who have sexual contact with a partner who has scabies.  · Young children who attend child care facilities.  · People who care for others who are at increased risk for scabies.  What are the signs or symptoms?  Symptoms of this condition may include:  · Severe itchiness. This is often worse at night.  · A rash that includes tiny red bumps or blisters. The rash commonly occurs on the wrist, elbow, armpit, fingers, waist, groin, or buttocks. Bumps may form a line (burrow) in some areas.  · Skin irritation. This can include scaly patches or sores.  How is this diagnosed?  This condition is diagnosed with a physical exam. Your health care provider will look closely at your skin. In some cases, your health care provider may take a sample of your affected skin (skin scraping) and have it examined under a microscope.  How is this treated?  This condition may be treated with:  · Medicated cream or lotion that kills the mites. This is spread on the entire body and left on for several hours. Usually, one treatment with  medicated cream or lotion is enough to kill all of the mites. In severe cases, the treatment may be repeated.  · Medicated cream that relieves itching.  · Medicines that help to relieve itching.  · Medicines that kill the mites. This treatment is rarely used.  Follow these instructions at home:    Medicines  · Take or apply over-the-counter and prescription medicines as told by your health care provider.  · Apply medicated cream or lotion as told by your health care provider.  · Do not wash off the medicated cream or lotion until the necessary amount of time has passed.  Skin Care  · Avoid scratching your affected skin.  · Keep your fingernails closely trimmed to reduce injury from scratching.  · Take cool baths or apply cool washcloths to help reduce itching.  General instructions  · Clean all items that you recently had contact with, including bedding, clothing, and furniture. Do this on the same day that your treatment starts.  ? Use hot water when you wash items.  ? Place unwashable items into closed, airtight plastic bags for at least 3 days. The mites cannot live for more than 3 days away from human skin.  ? Vacuum furniture and mattresses that you use.  · Make sure that other people who may have been infested are examined by a health care provider. These include members of your household and anyone who   may have had contact with infested items.  · Keep all follow-up visits as told by your health care provider. This is important.  Contact a health care provider if:  · You have itching that does not go away after 4 weeks of treatment.  · You continue to develop new bumps or burrows.  · You have redness, swelling, or pain in your rash area after treatment.  · You have fluid, blood, or pus coming from your rash.  This information is not intended to replace advice given to you by your health care provider. Make sure you discuss any questions you have with your health care provider.  Document Released: 06/25/2015  Document Revised: 03/11/2016 Document Reviewed: 05/06/2015  Elsevier Interactive Patient Education © 2019 [employer] Inc[employer]

## 2018-12-01 NOTE — Progress Notes (Signed)
Subjective:    Patient ID: Paula Kelley, female    DOB: May 29, 1951, 68 y.o.   MRN: [id number]  No chief complaint on file.   HPI Patient was seen today for acute concern.  Patient endorses pruritic rash on arms, neck, and back times a few days.  Pt states her uncle died last wk and she noticed the rash after cleaning his home.  Pt's sister has similar symptoms.  Pt tried cortisone cream for the itching.  Pt denies any changes to soaps, lotions, detergents, pets.  Past Medical History:  Diagnosis Date  . Allergic rhinitis   . Anxiety   . DJD (degenerative joint disease)    Sees Dr. Dierdre Forth  . Headache(784.0)   . Hypertension   . Mild anemia    iron def, reports on iron her whole life  . Obesity   . URI (upper respiratory infection)   . UTI (lower urinary tract infection)     Allergies  Allergen Reactions  . Sulfonamide Derivatives     REACTION: unsure of reaction---this was as a child    ROS General: Denies fever, chills, night sweats, changes in weight, changes in appetite  HEENT: Denies headaches, ear pain, changes in vision, rhinorrhea, sore throat CV: Denies CP, palpitations, SOB, orthopnea Pulm: Denies SOB, cough, wheezing GI: Denies abdominal pain, nausea, vomiting, diarrhea, constipation GU: Denies dysuria, hematuria, frequency, vaginal discharge Msk: Denies muscle cramps, joint pains Neuro: Denies weakness, numbness, tingling Skin: Denies bruising  + rash, pruritus Psych: Denies depression, anxiety, hallucinations     Objective:    Blood pressure 130/90, pulse 68, temperature (!) 97.5 F (36.4 C), weight 182 lb (82.6 kg), SpO2 98 %.  Gen. Pleasant, well-nourished, in no distress, normal affect   Lungs: no accessory muscle use Cardiovascular: RRR, no peripheral edema Musculoskeletal: No deformities, no cyanosis or clubbing, normal tone Neuro:  A&Ox3, CN II-XII intact, normal gait Skin:  Warm, dry, intact.  Erythematous, raised papules on extremities and  back.  Several lesions in a linear pattern on forearms.  No burrows or lesions in web spaces of fingers.  Wt Readings from Last 3 Encounters:  09/21/18 179 lb 6.4 oz (81.4 kg)  06/06/18 180 lb 1.6 oz (81.7 kg)  02/02/18 173 lb 8 oz (78.7 kg)    Lab Results  Component Value Date   WBC 6.1 09/21/2018   HGB 12.0 09/21/2018   HCT 37.1 09/21/2018   PLT 298.0 09/21/2018   GLUCOSE 92 09/21/2018   CHOL 157 02/02/2018   TRIG 57 02/02/2018   HDL 65 02/02/2018   LDLCALC 79 02/02/2018   ALT 14 10/27/2017   AST 19 10/27/2017   NA 139 09/21/2018   K 4.1 09/21/2018   CL 101 09/21/2018   CREATININE 0.79 09/21/2018   BUN 10 09/21/2018   CO2 31 09/21/2018   TSH 0.97 01/08/2014   INR 0.94 10/27/2017   HGBA1C 5.1 02/02/2018    Assessment/Plan:  Scabies  -likely scabies  -given handout -discussed pruritis likely to increase after treating with permethrin. -discussed may need 2nd application of permethrin in 1 wk. - Plan: permethrin (ELIMITE) 5 % cream, hydrOXYzine (ATARAX/VISTARIL) 25 MG tablet  F/u prn  Abbe Amsterdam, DM

## 2018-12-07 ENCOUNTER — Encounter: Payer: Self-pay | Admitting: Family Medicine

## 2018-12-08 ENCOUNTER — Telehealth: Payer: Self-pay | Admitting: Family Medicine

## 2018-12-08 NOTE — Telephone Encounter (Signed)
Medication not on pt's current med list. LOV on 12/01/18.

## 2018-12-08 NOTE — Telephone Encounter (Signed)
Copied from CRM [id number]. Topic: Quick Communication - Rx Refill/Question >> Dec 08, 2018  3:34 PM [place], [place] D wrote: Medication: permethrin (ELIMITE) 5 % cream / Pt is requesting one more refill of the cream.  Has the patient contacted their pharmacy? Yes.   (Agent: If no, request that the patient contact the pharmacy for the refill.) (Agent: If yes, when and what did the pharmacy advise?)  Preferred Pharmacy (with phone number or street name): [id number] [place], [place] - [address] AT [facility] [address] [phone] (Phone) [phone] (Fax)  Agent: Please be advised that RX refills may take up to 3 business days. We ask that you follow-up with your pharmacy.

## 2018-12-11 MED ORDER — PERMETHRIN 5 % EX CREA: 1.0000 "application " | TOPICAL_CREAM | Freq: Once | CUTANEOUS | 0 refills | Status: AC

## 2018-12-11 NOTE — Telephone Encounter (Signed)
Rx done. 

## 2018-12-11 NOTE — Telephone Encounter (Signed)
Patient returning call. Dr Elmyra Ricks message given to patient. Would like to know if the refill could be sent in. States that there was not a lot of cream in the tube. States that she was advised to put this cream all over her body and has ran out. Is still having some outbreaks of scabies. Does not want to come back in to be reevaluated because "that means another copay." Please advise.

## 2018-12-11 NOTE — Telephone Encounter (Signed)
I did not see her for this. Would see if Dr. Salomon Fick feels needs recheck? Bu... I am ok with 1x refill in interim. Would advise follow up if persists. Thanks.

## 2018-12-11 NOTE — Telephone Encounter (Signed)
I left a message for the pt to return my call.  CRM also created. 

## 2018-12-24 ENCOUNTER — Encounter (HOSPITAL_COMMUNITY): Payer: Self-pay | Admitting: Emergency Medicine

## 2018-12-24 ENCOUNTER — Ambulatory Visit (HOSPITAL_COMMUNITY)
Admission: EM | Admit: 2018-12-24 | Discharge: 2018-12-24 | Disposition: A | Discharge status: Home / Self Care | Payer: BC Managed Care – PPO | Attending: Family Medicine | Admitting: Family Medicine

## 2018-12-24 DIAGNOSIS — B86 Scabies: Secondary | ICD-10-CM | POA: Diagnosis not present

## 2018-12-24 MED ORDER — IVERMECTIN 3 MG PO TABS: 12.0000 mg | ORAL_TABLET | Freq: Once | ORAL | 1 refills | Status: AC

## 2018-12-24 MED ORDER — PREDNISONE 20 MG PO TABS: ORAL_TABLET | ORAL | 0 refills | Status: DC

## 2018-12-24 NOTE — ED Provider Notes (Signed)
[facility]    CSN: [id number] Arrival date & time: 12/24/18  1115     History   Chief Complaint Chief Complaint  Patient presents with  . Rash    HPI Paula Kelley is a 68 y.o. female.   Pt sts itchy rash from what she thinks is scabies   She has had a migratory rash on her forearms and upper arms as well as her back and trunk for a month.  She saw her primary care doctor a month ago who gave her permethrin and although it cleared up in one area, she is continued to have problems in other areas.  The rash is intensely itchy.  Began after she went to a relatives house to help him transfer to a rest home because he was unable to take care of himself.     Past Medical History:  Diagnosis Date  . Allergic rhinitis   . Anxiety   . DJD (degenerative joint disease)    Sees Dr. Dierdre Forth  . Headache(784.0)   . Hypertension   . Mild anemia    iron def, reports on iron her whole life  . Obesity   . URI (upper respiratory infection)   . UTI (lower urinary tract infection)     Patient Active Problem List   Diagnosis Date Noted  . Anemia 11/01/2008  . Allergic rhinitis 11/01/2008  . Essential hypertension 09/03/2008  . OA (osteoarthritis) of knee 09/03/2008    Past Surgical History:  Procedure Laterality Date  . COLONOSCOPY    . svd     2 live births  . 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 TOTAL KNEE ARTHROPLASTY Right 10/31/2017   Procedure: RIGHT TOTAL KNEE ARTHROPLASTY;  Surgeon: Ollen Gross, MD;  Location: [facility];  Service: Orthopedics;  Laterality: Right;  50 mins    OB History   No obstetric history on file.      Home Medications    Prior to Admission medications   Medication Sig Start Date End Date Taking? Authorizing Provider  amLODipine-benazepril (LOTREL) 5-20 MG capsule TAKE 1 CAPSULE  BY MOUTH DAILY 05/29/18   Terressa Koyanagi, DO  aspirin 81 MG tablet Take 81 mg by mouth daily.    [provider]  ferrous sulfate 325 (65 FE) MG tablet Take 325 mg by mouth daily with breakfast.    [provider]  fluticasone (FLONASE) 50 MCG/ACT nasal spray Place 2 sprays daily as needed into both nostrils (for allergies/sinus issues). Patient taking differently: Place 2 sprays into both nostrils daily as needed for allergies.  09/01/17   Terressa Koyanagi, DO  guaiFENesin (MUCINEX) 600 MG 12 hr tablet TAKE 1-2 TABLETS BY MOUTH TWICE DAILY AS NEEDED Patient taking differently: Take 600 mg by mouth daily as needed (for allergies/sinus issues).  02/04/14   Terressa Koyanagi, DO  hydrOXYzine (ATARAX/VISTARIL) 25 MG tablet Take 1 tablet (25 mg total) by mouth 3 (three) times daily as needed for itching. 12/01/18   Deeann Saint, MD  levocetirizine (XYZAL) 2.5 MG/5ML solution Take 5 mLs (2.5 mg total) daily as needed by mouth (for allergies/sinus issues). 09/01/17   Terressa Koyanagi, DO  OVER THE COUNTER MEDICATION Natural fiber supplement daily    [provider]  Potassium 99 MG TABS Take 99 mg by mouth daily.  [provider]    Family History Family History  Problem Relation Age of Onset  . Heart disease Father   . Heart disease Mother   . Colon cancer Neg Hx     Social History Social History   Tobacco Use  . Smoking status: Never Smoker  . Smokeless tobacco: Never Used  Substance Use Topics  . Alcohol use: Yes    Comment: social use  . Drug use: No     Allergies   Sulfonamide derivatives   Review of Systems Review of Systems   Physical Exam Triage Vital Signs ED Triage Vitals [12/24/18 1209]  Enc Vitals Group     BP (!) 162/89     Pulse Rate 85     Resp 18     Temp 97.6 F (36.4 C)     Temp Source Oral     SpO2 97 %     Weight      Height      Head Circumference      Peak Flow      Pain Score 0     Pain Loc      Pain Edu?       Excl. in GC?    No data found.  Updated Vital Signs BP (!) 162/89 (BP Location: Right Arm)   Pulse 85   Temp 97.6 F (36.4 C) (Oral)   Resp 18   SpO2 97%    Physical Exam Vitals signs and nursing note reviewed.  Constitutional:      Appearance: Normal appearance.  Pulmonary:     Effort: Pulmonary effort is normal.  Musculoskeletal: Normal range of motion.  Skin:    Findings: Rash present.  Neurological:     General: No focal deficit present.     Mental Status: She is alert.  Psychiatric:        Mood and Affect: Mood normal.        UC Treatments / Results  Labs (all labs ordered are listed, but only abnormal results are displayed) Labs Reviewed - No data to display  EKG None  Radiology No results found.  Procedures Procedures (including critical care time)  Medications Ordered in UC Medications - No data to display  Initial Impression / Assessment and Plan / UC Course  I have reviewed the triage vital signs and the nursing notes.  Pertinent labs & imaging results that were available during my care of the patient were reviewed by me and considered in my medical decision making (see chart for details).    Final Clinical Impressions(s) / UC Diagnoses   Final diagnoses:  None   Discharge Instructions   None    ED Prescriptions    None     Controlled Substance Prescriptions [place] Controlled Substance Registry consulted? Not Applicable   Elvina Sidle, MD 12/24/18 1300

## 2018-12-24 NOTE — ED Notes (Signed)
Pt discharged by provider.

## 2018-12-24 NOTE — ED Triage Notes (Signed)
Pt sts itchy rash from what she thinks is scabies

## 2018-12-25 ENCOUNTER — Encounter: Payer: Self-pay | Admitting: Family Medicine

## 2018-12-29 ENCOUNTER — Other Ambulatory Visit: Payer: Self-pay | Admitting: Family Medicine

## 2019-02-22 ENCOUNTER — Encounter: Payer: BC Managed Care – PPO | Admitting: Family Medicine

## 2019-03-27 ENCOUNTER — Encounter: Payer: BC Managed Care – PPO | Admitting: Family Medicine

## 2019-04-17 ENCOUNTER — Ambulatory Visit (INDEPENDENT_AMBULATORY_CARE_PROVIDER_SITE_OTHER): Payer: BC Managed Care – PPO | Admitting: Family Medicine

## 2019-04-17 ENCOUNTER — Encounter: Payer: Self-pay | Admitting: Family Medicine

## 2019-04-17 ENCOUNTER — Other Ambulatory Visit: Payer: Self-pay

## 2019-04-17 DIAGNOSIS — I10 Essential (primary) hypertension: Secondary | ICD-10-CM | POA: Diagnosis not present

## 2019-04-17 DIAGNOSIS — E669 Obesity, unspecified: Secondary | ICD-10-CM

## 2019-04-17 DIAGNOSIS — Z96653 Presence of artificial knee joint, bilateral: Secondary | ICD-10-CM | POA: Diagnosis not present

## 2019-04-17 DIAGNOSIS — J302 Other seasonal allergic rhinitis: Secondary | ICD-10-CM

## 2019-04-17 NOTE — Progress Notes (Signed)
Virtual Visit via Video Note  I connected with Paula Kelley on 04/17/19 at  4:00 PM EDT by a video enabled telemedicine application and verified that I am speaking with the correct person using two identifiers.  Location patient: home Location provider:work or home office Persons participating in the virtual visit: patient, provider  I discussed the limitations of evaluation and management by telemedicine and the availability of in person appointments. The patient expressed understanding and agreed to proceed.   HPI: Pt is a 68 yo female with pmh sig for HTN, anxiety seen for TOC, previously seen by Dr. Maudie Mercury.  Pt seen by this provider in the past for acute concern.  Pt updates this provider on her progress after that visit.  HTN: -walking 5 miles Saturday and Sunday. -stopped eating Klondike bars every day. -not checking bp at home. -taking amplodipine-benasepril 5-20 mg  Obesity: -pt wants to lose weight -walks on the weekends -may eat twice a day (breakfast and dinner) -tries not to eat late at night -drinking lots of water per day.  Seasonal allergies: -takes mucinex  S/p b/l TKR -had 2nd surgery on L knee for "clean out" -followed by Dr. Juanda Bond, Ortho   Social Hx:  Pt has worked in [employer] at [employer] for the last 25 yrs.  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  Pt's husband and children are also [employer] alums.  Pt's husband recently retired from [employer].  Pt is currently working from home.     ROS: See pertinent positives and negatives per HPI.  Past Medical History:  Diagnosis Date  . Allergic rhinitis   . Anxiety   . DJD (degenerative joint disease)    Sees Dr. Amil Amen  . Headache(784.0)   . Hypertension   . Mild anemia    iron def, reports on iron her whole life  . Obesity   . URI (upper respiratory infection)   . UTI (lower urinary tract infection)     Past Surgical History:  Procedure Laterality Date  . COLONOSCOPY    . svd     2 live  births  . TOTAL KNEE ARTHROPLASTY Left 11/01/2016   Procedure: LEFT TOTAL KNEE ARTHROPLASTY;  Surgeon: Gaynelle Arabian, MD;  Location: [facility];  Service: Orthopedics;  Laterality: Left;  Adductor Block  . TOTAL KNEE ARTHROPLASTY Right 10/31/2017   Procedure: RIGHT TOTAL KNEE ARTHROPLASTY;  Surgeon: Gaynelle Arabian, MD;  Location: [facility];  Service: Orthopedics;  Laterality: Right;  50 mins    Family History  Problem Relation Age of Onset  . Heart disease Father   . Heart disease Mother   . Colon cancer Neg Hx      Current Outpatient Medications:  .  amLODipine-benazepril (LOTREL) 5-20 MG capsule, TAKE 1 CAPSULE BY MOUTH DAILY, Disp: 30 capsule, Rfl: 5 .  aspirin 81 MG tablet, Take 81 mg by mouth daily., Disp: , Rfl:  .  ferrous sulfate 325 (65 FE) MG tablet, Take 325 mg by mouth daily with breakfast., Disp: , Rfl:  .  fluticasone (FLONASE) 50 MCG/ACT nasal spray, Place 2 sprays daily as needed into both nostrils (for allergies/sinus issues). (Patient taking differently: Place 2 sprays into both nostrils daily as needed for allergies. ), Disp: 16 g, Rfl: 3 .  guaiFENesin (MUCINEX) 600 MG 12 hr tablet, TAKE 1-2 TABLETS BY MOUTH TWICE DAILY AS NEEDED (Patient taking differently: Take 600 mg by mouth daily as needed (for allergies/sinus issues). ), Disp: 120 tablet, Rfl: 1 .  hydrOXYzine (ATARAX/VISTARIL) 25 MG tablet, Take 1 tablet (25 mg total) by mouth 3 (three) times daily as needed for itching., Disp: 30 tablet, Rfl: 0 .  levocetirizine (XYZAL) 2.5 MG/5ML solution, Take 5 mLs (2.5 mg total) daily as needed by mouth (for allergies/sinus issues)., Disp: 148 mL, Rfl: 3 .  OVER THE COUNTER MEDICATION, Natural fiber supplement daily, Disp: , Rfl:  .  Potassium 99 MG TABS, Take 99 mg by mouth daily., Disp: , Rfl:  .  predniSONE (DELTASONE) 20 MG tablet, Two daily with food, Disp: 10 tablet, Rfl: 0  EXAM:  VITALS per patient if applicable:  RR between 12-20 bpm  GENERAL: alert, oriented, appears  well and in no acute distress  HEENT: atraumatic, conjunctiva clear, no obvious abnormalities on inspection of external nose and ears  NECK: normal movements of the head and neck  LUNGS: on inspection no signs of respiratory distress, breathing rate appears normal, no obvious gross SOB, gasping or wheezing  CV: no obvious cyanosis  MS: moves all visible extremities without noticeable abnormality  PSYCH/NEURO: pleasant and cooperative, no obvious depression or anxiety, speech and thought processing grossly intact  ASSESSMENT AND PLAN:  Discussed the following assessment and plan:  Essential hypertension  -continue amlodipine-benazepril 5-20 mg -continue lifestyle modifications -pt encouraged to obtain bp cuff for home monitoring to keep a log  Seasonal allergies  -continue OTC meds prn  History of total bilateral knee replacement (TKR)  -continue f/u with Dr. Lequita HaltAluisio, Ortho  Obesity, unspecified classification, unspecified obesity type, unspecified whether serious comorbidity present  -Discussed increasing physical activity -Given online resources -discussed portion sizes and eating regular meals.  F/u in the next few wks to months for CPE.   I discussed the assessment and treatment plan with the patient. The patient was provided an opportunity to ask questions and all were answered. The patient agreed with the plan and demonstrated an understanding of the instructions.   The patient was advised to call back or seek an in-person evaluation if the symptoms worsen or if the condition fails to improve as anticipated.   Deeann SaintShannon R Deziyah Arvin, MD

## 2019-05-16 ENCOUNTER — Other Ambulatory Visit: Payer: Self-pay | Admitting: Family Medicine

## 2019-05-30 ENCOUNTER — Encounter: Payer: Self-pay | Admitting: Family Medicine

## 2019-05-30 ENCOUNTER — Other Ambulatory Visit: Payer: Self-pay

## 2019-05-30 ENCOUNTER — Ambulatory Visit (INDEPENDENT_AMBULATORY_CARE_PROVIDER_SITE_OTHER): Payer: BC Managed Care – PPO | Admitting: Family Medicine

## 2019-05-30 VITALS — BP 122/80 | HR 79 | Temp 97.7°F | Ht 63.5 in | Wt 181.4 lb

## 2019-05-30 DIAGNOSIS — Z13220 Encounter for screening for lipoid disorders: Secondary | ICD-10-CM | POA: Diagnosis not present

## 2019-05-30 DIAGNOSIS — J302 Other seasonal allergic rhinitis: Secondary | ICD-10-CM

## 2019-05-30 DIAGNOSIS — I10 Essential (primary) hypertension: Secondary | ICD-10-CM

## 2019-05-30 DIAGNOSIS — Z0000 Encounter for general adult medical examination without abnormal findings: Secondary | ICD-10-CM

## 2019-05-30 LAB — CBC WITH DIFFERENTIAL/PLATELET
Basophils Absolute: 0 10*3/uL (ref 0.0–0.1)
Basophils Relative: 0.1 % (ref 0.0–3.0)
Eosinophils Absolute: 0 10*3/uL (ref 0.0–0.7)
Eosinophils Relative: 0 % (ref 0.0–5.0)
HCT: 38.8 % (ref 36.0–46.0)
Hemoglobin: 12.6 g/dL (ref 12.0–15.0)
Lymphocytes Relative: 33.5 % (ref 12.0–46.0)
Lymphs Abs: 1.7 10*3/uL (ref 0.7–4.0)
MCHC: 32.5 g/dL (ref 30.0–36.0)
MCV: 88.5 fl (ref 78.0–100.0)
Monocytes Absolute: 0.4 10*3/uL (ref 0.1–1.0)
Monocytes Relative: 6.8 % (ref 3.0–12.0)
Neutro Abs: 3.1 10*3/uL (ref 1.4–7.7)
Neutrophils Relative %: 59.6 % (ref 43.0–77.0)
Platelets: 257 10*3/uL (ref 150.0–400.0)
RBC: 4.38 Mil/uL (ref 3.87–5.11)
RDW: 13.9 % (ref 11.5–15.5)
WBC: 5.2 10*3/uL (ref 4.0–10.5)

## 2019-05-30 LAB — LIPID PANEL
Cholesterol: 210 mg/dL — ABNORMAL HIGH (ref 0–200)
HDL: 76 mg/dL (ref >39.00)
LDL Cholesterol: 123 mg/dL — ABNORMAL HIGH (ref 0–99)
NonHDL: 133.65
Total CHOL/HDL Ratio: 3
Triglycerides: 51 mg/dL (ref 0.0–149.0)
VLDL: 10.2 mg/dL (ref 0.0–40.0)

## 2019-05-30 LAB — BASIC METABOLIC PANEL
BUN: 12 mg/dL (ref 6–23)
CO2: 29 mEq/L (ref 19–32)
Calcium: 10.3 mg/dL (ref 8.4–10.5)
Chloride: 101 mEq/L (ref 96–112)
Creatinine, Ser: 0.81 mg/dL (ref 0.40–1.20)
GFR: 85.13 mL/min (ref >60.00)
Glucose, Bld: 86 mg/dL (ref 70–99)
Potassium: 4.1 mEq/L (ref 3.5–5.1)
Sodium: 139 mEq/L (ref 135–145)

## 2019-05-30 MED ORDER — AMLODIPINE BESY-BENAZEPRIL HCL 5-20 MG PO CAPS: 1.0000 | ORAL_CAPSULE | Freq: Every day | ORAL | 3 refills | Status: DC

## 2019-05-30 NOTE — Progress Notes (Signed)
Subjective:     Paula Kelley is a 68 y.o. female and is here for a comprehensive physical exam. The patient reports no problems.  Pt states she is doing well.  Walking on the wknds with her husband.  Pt is s/p b/l TKR, denies pain or edema.  Stretching and using ice prn.  Trying to eat better as gained some weight since COVID-19 pandemic started.  Pt went back to work in person 2 days per wk.  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  Pt followed by OB/Gyn for pap and mammogram.  Has appt in a few months.  Social History   Socioeconomic History  . Marital status: Married    Spouse name: Not on file  . Number of children: Not on file  . Years of education: Not on file  . Highest education level: Not on file  Occupational History  . Occupation: retired  Engineer, productionocial Needs  . Financial resource strain: Not on file  . Food insecurity    Worry: Not on file    Inability: Not on file  . Transportation needs    Medical: Not on file    Non-medical: Not on file  Tobacco Use  . Smoking status: Never Smoker  . Smokeless tobacco: Never Used  Substance and Sexual Activity  . Alcohol use: Yes    Comment: social use  . Drug use: No  . Sexual activity: Not on file  Lifestyle  . Physical activity    Days per week: Not on file    Minutes per session: Not on file  . Stress: Not on file  Relationships  . Social Musicianconnections    Talks on phone: Not on file    Gets together: Not on file    Attends religious service: Not on file    Active member of club or organization: Not on file    Attends meetings of clubs or organizations: Not on file    Relationship status: Not on file  . Intimate partner violence    Fear of current or ex partner: Not on file    Emotionally abused: Not on file    Physically abused: Not on file    Forced sexual activity: Not on file  Other Topics Concern  . Not on file  Social History Narrative   Work or School: works at [employer] and T at [employer]      Home Situation: lives  with husband      Spiritual Beliefs: methodist      Lifestyle: walks; trying to eat a healthy diet            Health Maintenance  Topic Date Due  . INFLUENZA VACCINE  05/19/2019  . MAMMOGRAM  09/18/2019  . TETANUS/TDAP  02/05/2025  . COLONOSCOPY  05/12/2026  . DEXA SCAN  Completed  . Hepatitis C Screening  Completed  . PNA vac Low Risk Adult  Completed    The following portions of the patient's history were reviewed and updated as appropriate: allergies, current medications, past family history, past medical history, past social history, past surgical history and problem list.  Review of Systems Pertinent items noted in HPI and remainder of comprehensive ROS otherwise negative.   Objective:    BP 122/80   Pulse 79   Temp 97.7 F (36.5 C) (Oral)   Ht 5' 3.5" (1.613 m)   Wt 181 lb 6.4 oz (82.3 kg)   SpO2 100%   BMI 31.63 kg/m  General  appearance: alert, cooperative and no distress Head: Normocephalic, without obvious abnormality, atraumatic Eyes: conjunctivae/corneas clear. PERRL, EOM's intact. Fundi benign. Ears: normal TM's and external ear canals both ears Nose: Nares normal. Septum midline. Mucosa normal. No drainage or sinus tenderness. Throat: lips, mucosa, and tongue normal; teeth and gums normal Neck: no adenopathy, no carotid bruit, no JVD, supple, symmetrical, trachea midline and thyroid not enlarged, symmetric, no tenderness/mass/nodules Lungs: clear to auscultation bilaterally Heart: regular rate and rhythm, S1, S2 normal, no murmur, click, rub or gallop Abdomen: soft, non-tender; bowel sounds normal; no masses,  no organomegaly Extremities: extremities normal, atraumatic, no cyanosis or edema Pulses: 2+ and symmetric Skin: Skin color, texture, turgor normal. No rashes or lesions Lymph nodes: Cervical, supraclavicular, and axillary nodes normal. Neurologic: Alert and oriented X 3, normal strength and tone. Normal symmetric reflexes. Normal coordination and  gait    Assessment:    Healthy female exam.      Plan:     Anticipatory guidance given including wearing seatbelts, smoke detectors in the home, increasing physical activity, increasing p.o. intake of water and vegetables. -will obtain labs -pap and mammogram up to date -given handouts -next CPE in 1 yr See After Visit Summary for Counseling Recommendations    HTN -controlled -continue amlodipine-benazepril 5-20 mg -discussed lifestyle modifications  Screen for cholesterol -lipid panel.  Seasonal allergies -continue OTC allergy med prn  F/u in 6 months to 1 yr.  Sooner if needed  Grier Mitts, MD

## 2019-05-30 NOTE — Patient Instructions (Addendum)
Health Maintenance Due  Topic Date Due  . INFLUENZA VACCINE  05/19/2019    Depression screen [facility] 2/9 05/30/2019 02/02/2018 01/08/2014  Decreased Interest 0 0 0  Down, Depressed, Hopeless 0 0 0  PHQ - 2 Score 0 0 0   Preventive Care 65 Years and Older, Female Preventive care refers to lifestyle choices and visits with your health care provider that can promote health and wellness. This includes:  A yearly physical exam. This is also called an annual well check.  Regular dental and eye exams.  Immunizations.  Screening for certain conditions.  Healthy lifestyle choices, such as diet and exercise. What can I expect for my preventive care visit? Physical exam Your health care provider will check:  Height and weight. These may be used to calculate body mass index (BMI), which is a measurement that tells if you are at a healthy weight.  Heart rate and blood pressure.  Your skin for abnormal spots. Counseling Your health care provider may ask you questions about:  Alcohol, tobacco, and drug use.  Emotional well-being.  Home and relationship well-being.  Sexual activity.  Eating habits.  History of falls.  Memory and ability to understand (cognition).  Work and work Statistician.  Pregnancy and menstrual history. What immunizations do I need?  Influenza (flu) vaccine  This is recommended every year. Tetanus, diphtheria, and pertussis (Tdap) vaccine  You may need a Td booster every 10 years. Varicella (chickenpox) vaccine  You may need this vaccine if you have not already been vaccinated. Zoster (shingles) vaccine  You may need this after age 38. Pneumococcal conjugate (PCV13) vaccine  One dose is recommended after age 74. Pneumococcal polysaccharide (PPSV23) vaccine  One dose is recommended after age 69. Measles, mumps, and rubella (MMR) vaccine  You may need at least one dose of MMR if you were born in 1957 or later. You may also need a second  dose. Meningococcal conjugate (MenACWY) vaccine  You may need this if you have certain conditions. Hepatitis A vaccine  You may need this if you have certain conditions or if you travel or work in places where you may be exposed to hepatitis A. Hepatitis B vaccine  You may need this if you have certain conditions or if you travel or work in places where you may be exposed to hepatitis B. Haemophilus influenzae type b (Hib) vaccine  You may need this if you have certain conditions. You may receive vaccines as individual doses or as more than one vaccine together in one shot (combination vaccines). Talk with your health care provider about the risks and benefits of combination vaccines. What tests do I need? Blood tests  Lipid and cholesterol levels. These may be checked every 5 years, or more frequently depending on your overall health.  Hepatitis C test.  Hepatitis B test. Screening  Lung cancer screening. You may have this screening every year starting at age 21 if you have a 30-pack-year history of smoking and currently smoke or have quit within the past 15 years.  Colorectal cancer screening. All adults should have this screening starting at age 10 and continuing until age 78. Your health care provider may recommend screening at age 56 if you are at increased risk. You will have tests every 1-10 years, depending on your results and the type of screening test.  Diabetes screening. This is done by checking your blood sugar (glucose) after you have not eaten for a while (fasting). You may have this done every  1-3 years.  Mammogram. This may be done every 1-2 years. Talk with your health care provider about how often you should have regular mammograms.  BRCA-related cancer screening. This may be done if you have a family history of breast, ovarian, tubal, or peritoneal cancers. Other tests  Sexually transmitted disease (STD) testing.  Bone density scan. This is done to screen for  osteoporosis. You may have this done starting at age 10. Follow these instructions at home: Eating and drinking  Eat a diet that includes fresh fruits and vegetables, whole grains, lean protein, and low-fat dairy products. Limit your intake of foods with high amounts of sugar, saturated fats, and salt.  Take vitamin and mineral supplements as recommended by your health care provider.  Do not drink alcohol if your health care provider tells you not to drink.  If you drink alcohol: ? Limit how much you have to 0-1 drink a day. ? Be aware of how much alcohol is in your drink. In the [place] one drink equals one 12 oz bottle of beer (355 mL), one 5 oz glass of wine (148 mL), or one 1 oz glass of hard liquor (44 mL). Lifestyle  Take daily care of your teeth and gums.  Stay active. Exercise for at least 30 minutes on 5 or more days each week.  Do not use any products that contain nicotine or tobacco, such as cigarettes, e-cigarettes, and chewing tobacco. If you need help quitting, ask your health care provider.  If you are sexually active, practice safe sex. Use a condom or other form of protection in order to prevent STIs (sexually transmitted infections).  Talk with your health care provider about taking a low-dose aspirin or statin. What's next?  Go to your health care provider once a year for a well check visit.  Ask your health care provider how often you should have your eyes and teeth checked.  Stay up to date on all vaccines. This information is not intended to replace advice given to you by your health care provider. Make sure you discuss any questions you have with your health care provider. Document Released: 10/31/2015 Document Revised: 09/28/2018 Document Reviewed: 09/28/2018 Elsevier Patient Education  2020 [employer].  Managing Your Hypertension Hypertension is commonly called high blood pressure. This is when the force of your blood pressing against the walls of your  arteries is too strong. Arteries are blood vessels that carry blood from your heart throughout your body. Hypertension forces the heart to work harder to pump blood, and may cause the arteries to become narrow or stiff. Having untreated or uncontrolled hypertension can cause heart attack, stroke, kidney disease, and other problems. What are blood pressure readings? A blood pressure reading consists of a higher number over a lower number. Ideally, your blood pressure should be below 120/80. The first ("top") number is called the systolic pressure. It is a measure of the pressure in your arteries as your heart beats. The second ("bottom") number is called the diastolic pressure. It is a measure of the pressure in your arteries as the heart relaxes. What does my blood pressure reading mean? Blood pressure is classified into four stages. Based on your blood pressure reading, your health care provider may use the following stages to determine what type of treatment you need, if any. Systolic pressure and diastolic pressure are measured in a unit called mm Hg. Normal  Systolic pressure: below 209.  Diastolic pressure: below 80. Elevated  Systolic pressure: 470-962.  Diastolic pressure: below 80. Hypertension stage 1  Systolic pressure: 790-240.  Diastolic pressure: 97-35. Hypertension stage 2  Systolic pressure: 329 or above.  Diastolic pressure: 90 or above. What health risks are associated with hypertension? Managing your hypertension is an important responsibility. Uncontrolled hypertension can lead to:  A heart attack.  A stroke.  A weakened blood vessel (aneurysm).  Heart failure.  Kidney damage.  Eye damage.  Metabolic syndrome.  Memory and concentration problems. What changes can I make to manage my hypertension? Hypertension can be managed by making lifestyle changes and possibly by taking medicines. Your health care provider will help you make a plan to bring your blood  pressure within a normal range. Eating and drinking   Eat a diet that is high in fiber and potassium, and low in salt (sodium), added sugar, and fat. An example eating plan is called the DASH (Dietary Approaches to Stop Hypertension) diet. To eat this way: ? Eat plenty of fresh fruits and vegetables. Try to fill half of your plate at each meal with fruits and vegetables. ? Eat whole grains, such as whole wheat pasta, brown rice, or whole grain bread. Fill about one quarter of your plate with whole grains. ? Eat low-fat diary products. ? Avoid fatty cuts of meat, processed or cured meats, and poultry with skin. Fill about one quarter of your plate with lean proteins such as fish, chicken without skin, beans, eggs, and tofu. ? Avoid premade and processed foods. These tend to be higher in sodium, added sugar, and fat.  Reduce your daily sodium intake. Most people with hypertension should eat less than 1,500 mg of sodium a day.  Limit alcohol intake to no more than 1 drink a day for nonpregnant women and 2 drinks a day for men. One drink equals 12 oz of beer, 5 oz of wine, or 1 oz of hard liquor. Lifestyle  Work with your health care provider to maintain a healthy body weight, or to lose weight. Ask what an ideal weight is for you.  Get at least 30 minutes of exercise that causes your heart to beat faster (aerobic exercise) most days of the week. Activities may include walking, swimming, or biking.  Include exercise to strengthen your muscles (resistance exercise), such as weight lifting, as part of your weekly exercise routine. Try to do these types of exercises for 30 minutes at least 3 days a week.  Do not use any products that contain nicotine or tobacco, such as cigarettes and e-cigarettes. If you need help quitting, ask your health care provider.  Control any long-term (chronic) conditions you have, such as high cholesterol or diabetes. Monitoring  Monitor your blood pressure at home as  told by your health care provider. Your personal target blood pressure may vary depending on your medical conditions, your age, and other factors.  Have your blood pressure checked regularly, as often as told by your health care provider. Working with your health care provider  Review all the medicines you take with your health care provider because there may be side effects or interactions.  Talk with your health care provider about your diet, exercise habits, and other lifestyle factors that may be contributing to hypertension.  Visit your health care provider regularly. Your health care provider can help you create and adjust your plan for managing hypertension. Will I need medicine to control my blood pressure? Your health care provider may prescribe medicine if lifestyle changes are not enough to get your  blood pressure under control, and if:  Your systolic blood pressure is 130 or higher.  Your diastolic blood pressure is 80 or higher. Take medicines only as told by your health care provider. Follow the directions carefully. Blood pressure medicines must be taken as prescribed. The medicine does not work as well when you skip doses. Skipping doses also puts you at risk for problems. Contact a health care provider if:  You think you are having a reaction to medicines you have taken.  You have repeated (recurrent) headaches.  You feel dizzy.  You have swelling in your ankles.  You have trouble with your vision. Get help right away if:  You develop a severe headache or confusion.  You have unusual weakness or numbness, or you feel faint.  You have severe pain in your chest or abdomen.  You vomit repeatedly.  You have trouble breathing. Summary  Hypertension is when the force of blood pumping through your arteries is too strong. If this condition is not controlled, it may put you at risk for serious complications.  Your personal target blood pressure may vary depending on  your medical conditions, your age, and other factors. For most people, a normal blood pressure is less than 120/80.  Hypertension is managed by lifestyle changes, medicines, or both. Lifestyle changes include weight loss, eating a healthy, low-sodium diet, exercising more, and limiting alcohol. This information is not intended to replace advice given to you by your health care provider. Make sure you discuss any questions you have with your health care provider. Document Released: 06/28/2012 Document Revised: 01/26/2019 Document Reviewed: 09/01/2016 Elsevier Patient Education  2020 [employer].  Exercising to Lose Weight Exercise is structured, repetitive physical activity to improve fitness and health. Getting regular exercise is important for everyone. It is especially important if you are overweight. Being overweight increases your risk of heart disease, stroke, diabetes, high blood pressure, and several types of cancer. Reducing your calorie intake and exercising can help you lose weight. Exercise is usually categorized as moderate or vigorous intensity. To lose weight, most people need to do a certain amount of moderate-intensity or vigorous-intensity exercise each week. Moderate-intensity exercise  Moderate-intensity exercise is any activity that gets you moving enough to burn at least three times more energy (calories) than if you were sitting. Examples of moderate exercise include:  Walking a mile in 15 minutes.  Doing light yard work.  Biking at an easy pace. Most people should get at least 150 minutes (2 hours and 30 minutes) a week of moderate-intensity exercise to maintain their body weight. Vigorous-intensity exercise Vigorous-intensity exercise is any activity that gets you moving enough to burn at least six times more calories than if you were sitting. When you exercise at this intensity, you should be working hard enough that you are not able to carry on a  conversation. Examples of vigorous exercise include:  Running.  Playing a team sport, such as football, basketball, and soccer.  Jumping rope. Most people should get at least 75 minutes (1 hour and 15 minutes) a week of vigorous-intensity exercise to maintain their body weight. How can exercise affect me? When you exercise enough to burn more calories than you eat, you lose weight. Exercise also reduces body fat and builds muscle. The more muscle you have, the more calories you burn. Exercise also:  Improves mood.  Reduces stress and tension.  Improves your overall fitness, flexibility, and endurance.  Increases bone strength. The amount of exercise you need  to lose weight depends on:  Your age.  The type of exercise.  Any health conditions you have.  Your overall physical ability. Talk to your health care provider about how much exercise you need and what types of activities are safe for you. What actions can I take to lose weight? Nutrition   Make changes to your diet as told by your health care provider or diet and nutrition specialist (dietitian). This may include: ? Eating fewer calories. ? Eating more protein. ? Eating less unhealthy fats. ? Eating a diet that includes fresh fruits and vegetables, whole grains, low-fat dairy products, and lean protein. ? Avoiding foods with added fat, salt, and sugar.  Drink plenty of water while you exercise to prevent dehydration or heat stroke. Activity  Choose an activity that you enjoy and set realistic goals. Your health care provider can help you make an exercise plan that works for you.  Exercise at a moderate or vigorous intensity most days of the week. ? The intensity of exercise may vary from person to person. You can tell how intense a workout is for you by paying attention to your breathing and heartbeat. Most people will notice their breathing and heartbeat get faster with more intense exercise.  Do resistance  training twice each week, such as: ? Push-ups. ? Sit-ups. ? Lifting weights. ? Using resistance bands.  Getting short amounts of exercise can be just as helpful as long structured periods of exercise. If you have trouble finding time to exercise, try to include exercise in your daily routine. ? Get up, stretch, and walk around every 30 minutes throughout the day. ? Go for a walk during your lunch break. ? Park your car farther away from your destination. ? If you take public transportation, get off one stop early and walk the rest of the way. ? Make phone calls while standing up and walking around. ? Take the stairs instead of elevators or escalators.  Wear comfortable clothes and shoes with good support.  Do not exercise so much that you hurt yourself, feel dizzy, or get very short of breath. Where to find more information  [place] Department of Health and Human Services: [employer]at  Centers for Disease Control and Prevention (CDC): http://www.wolf.info/ Contact a health care provider:  Before starting a new exercise program.  If you have questions or concerns about your weight.  If you have a medical problem that keeps you from exercising. Get help right away if you have any of the following while exercising:  Injury.  Dizziness.  Difficulty breathing or shortness of breath that does not go away when you stop exercising.  Chest pain.  Rapid heartbeat. Summary  Being overweight increases your risk of heart disease, stroke, diabetes, high blood pressure, and several types of cancer.  Losing weight happens when you burn more calories than you eat.  Reducing the amount of calories you eat in addition to getting regular moderate or vigorous exercise each week helps you lose weight. This information is not intended to replace advice given to you by your health care provider. Make sure you discuss any questions you have with your health care provider. Document Released: 11/06/2010  Document Revised: 10/17/2017 Document Reviewed: 10/17/2017 Elsevier Patient Education  2020 [employer].

## 2019-08-23 ENCOUNTER — Other Ambulatory Visit: Payer: Self-pay | Admitting: Family Medicine

## 2019-08-23 MED ORDER — LEVOCETIRIZINE DIHYDROCHLORIDE 2.5 MG/5ML PO SOLN: 2.5000 mg | Freq: Every day | ORAL | 3 refills | Status: DC | PRN

## 2019-08-23 NOTE — Telephone Encounter (Signed)
Medication Refill - Medication: levocetirizine (XYZAL) 2.5 MG/5ML solution  Has the patient contacted their pharmacy? Yes.   (Agent: If no, request that the patient contact the pharmacy for the refill.) (Agent: If yes, when and what did the pharmacy advise?)  Preferred Pharmacy (with phone number or street name):[facility], [place] AT [place]: Please be advised that RX refills may take up to 3 business days. We ask that you follow-up with your pharmacy.

## 2019-12-26 ENCOUNTER — Emergency Department (HOSPITAL_COMMUNITY)
Admission: EM | Admit: 2019-12-26 | Discharge: 2019-12-26 | Disposition: A | Discharge status: Home / Self Care | Payer: BC Managed Care – PPO | Attending: Emergency Medicine | Admitting: Emergency Medicine

## 2019-12-26 ENCOUNTER — Encounter (HOSPITAL_COMMUNITY): Payer: Self-pay | Admitting: Emergency Medicine

## 2019-12-26 ENCOUNTER — Other Ambulatory Visit: Payer: Self-pay

## 2019-12-26 DIAGNOSIS — Z041 Encounter for examination and observation following transport accident: Secondary | ICD-10-CM | POA: Insufficient documentation

## 2019-12-26 DIAGNOSIS — W2219XA Striking against or struck by other automobile airbag, initial encounter: Secondary | ICD-10-CM | POA: Diagnosis not present

## 2019-12-26 DIAGNOSIS — Y999 Unspecified external cause status: Secondary | ICD-10-CM | POA: Insufficient documentation

## 2019-12-26 DIAGNOSIS — Y92410 Unspecified street and highway as the place of occurrence of the external cause: Secondary | ICD-10-CM | POA: Insufficient documentation

## 2019-12-26 DIAGNOSIS — Y9389 Activity, other specified: Secondary | ICD-10-CM | POA: Insufficient documentation
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
## 2020-06-04 ENCOUNTER — Other Ambulatory Visit: Payer: Self-pay

## 2020-06-04 ENCOUNTER — Ambulatory Visit (INDEPENDENT_AMBULATORY_CARE_PROVIDER_SITE_OTHER): Payer: BC Managed Care – PPO | Admitting: Family Medicine

## 2020-06-04 ENCOUNTER — Encounter: Payer: Self-pay | Admitting: Family Medicine

## 2020-06-04 ENCOUNTER — Encounter: Payer: BC Managed Care – PPO | Admitting: Family Medicine

## 2020-06-04 VITALS — BP 120/69 | HR 77 | Temp 98.1°F | Ht 63.0 in | Wt 177.0 lb

## 2020-06-04 DIAGNOSIS — E782 Mixed hyperlipidemia: Secondary | ICD-10-CM | POA: Diagnosis not present

## 2020-06-04 DIAGNOSIS — I10 Essential (primary) hypertension: Secondary | ICD-10-CM | POA: Diagnosis not present

## 2020-06-04 DIAGNOSIS — Z0000 Encounter for general adult medical examination without abnormal findings: Secondary | ICD-10-CM

## 2020-06-04 DIAGNOSIS — Z96653 Presence of artificial knee joint, bilateral: Secondary | ICD-10-CM

## 2020-06-04 DIAGNOSIS — D508 Other iron deficiency anemias: Secondary | ICD-10-CM

## 2020-06-04 DIAGNOSIS — R635 Abnormal weight gain: Secondary | ICD-10-CM | POA: Diagnosis not present

## 2020-06-04 MED ORDER — AMLODIPINE BESY-BENAZEPRIL HCL 5-20 MG PO CAPS: 1.0000 | ORAL_CAPSULE | Freq: Every day | ORAL | 3 refills | Status: DC

## 2020-06-04 NOTE — Patient Instructions (Addendum)
Preventive Care 69 Years and Older, Female Preventive care refers to lifestyle choices and visits with your health care provider that can promote health and wellness. This includes:  A yearly physical exam. This is also called an annual well check.  Regular dental and eye exams.  Immunizations.  Screening for certain conditions.  Healthy lifestyle choices, such as diet and exercise. What can I expect for my preventive care visit? Physical exam Your health care provider will check:  Height and weight. These may be used to calculate body mass index (BMI), which is a measurement that tells if you are at a healthy weight.  Heart rate and blood pressure.  Your skin for abnormal spots. Counseling Your health care provider may ask you questions about:  Alcohol, tobacco, and drug use.  Emotional well-being.  Home and relationship well-being.  Sexual activity.  Eating habits.  History of falls.  Memory and ability to understand (cognition).  Work and work Statistician.  Pregnancy and menstrual history. What immunizations do I need?  Influenza (flu) vaccine  This is recommended every year. Tetanus, diphtheria, and pertussis (Tdap) vaccine  You may need a Td booster every 10 years. Varicella (chickenpox) vaccine  You may need this vaccine if you have not already been vaccinated. Zoster (shingles) vaccine  You may need this after age 33. Pneumococcal conjugate (PCV13) vaccine  One dose is recommended after age 33. Pneumococcal polysaccharide (PPSV23) vaccine  One dose is recommended after age 72. Measles, mumps, and rubella (MMR) vaccine  You may need at least one dose of MMR if you were born in 1957 or later. You may also need a second dose. Meningococcal conjugate (MenACWY) vaccine  You may need this if you have certain conditions. Hepatitis A vaccine  You may need this if you have certain conditions or if you travel or work in places where you may be exposed  to hepatitis A. Hepatitis B vaccine  You may need this if you have certain conditions or if you travel or work in places where you may be exposed to hepatitis B. Haemophilus influenzae type b (Hib) vaccine  You may need this if you have certain conditions. You may receive vaccines as individual doses or as more than one vaccine together in one shot (combination vaccines). Talk with your health care provider about the risks and benefits of combination vaccines. What tests do I need? Blood tests  Lipid and cholesterol levels. These may be checked every 5 years, or more frequently depending on your overall health.  Hepatitis C test.  Hepatitis B test. Screening  Lung cancer screening. You may have this screening every year starting at age 39 if you have a 30-pack-year history of smoking and currently smoke or have quit within the past 15 years.  Colorectal cancer screening. All adults should have this screening starting at age 36 and continuing until age 15. Your health care provider may recommend screening at age 23 if you are at increased risk. You will have tests every 1-10 years, depending on your results and the type of screening test.  Diabetes screening. This is done by checking your blood sugar (glucose) after you have not eaten for a while (fasting). You may have this done every 1-3 years.  Mammogram. This may be done every 1-2 years. Talk with your health care provider about how often you should have regular mammograms.  BRCA-related cancer screening. This may be done if you have a family history of breast, ovarian, tubal, or peritoneal cancers.  Other tests  Sexually transmitted disease (STD) testing.  Bone density scan. This is done to screen for osteoporosis. You may have this done starting at age 82. Follow these instructions at home: Eating and drinking  Eat a diet that includes fresh fruits and vegetables, whole grains, lean protein, and low-fat dairy products. Limit  your intake of foods with high amounts of sugar, saturated fats, and salt.  Take vitamin and mineral supplements as recommended by your health care provider.  Do not drink alcohol if your health care provider tells you not to drink.  If you drink alcohol: ? Limit how much you have to 0-1 drink a day. ? Be aware of how much alcohol is in your drink. In the [place] one drink equals one 12 oz bottle of beer (355 mL), one 5 oz glass of wine (148 mL), or one 1 oz glass of hard liquor (44 mL). Lifestyle  Take daily care of your teeth and gums.  Stay active. Exercise for at least 30 minutes on 5 or more days each week.  Do not use any products that contain nicotine or tobacco, such as cigarettes, e-cigarettes, and chewing tobacco. If you need help quitting, ask your health care provider.  If you are sexually active, practice safe sex. Use a condom or other form of protection in order to prevent STIs (sexually transmitted infections).  Talk with your health care provider about taking a low-dose aspirin or statin. What's next?  Go to your health care provider once a year for a well check visit.  Ask your health care provider how often you should have your eyes and teeth checked.  Stay up to date on all vaccines. This information is not intended to replace advice given to you by your health care provider. Make sure you discuss any questions you have with your health care provider. Document Revised: 09/28/2018 Document Reviewed: 09/28/2018 Elsevier Patient Education  2020 [employer]  Preventing Iron Deficiency Anemia, Adult  Iron deficiency is having a lack of iron in the body. Iron is an important mineral that your body needs to build healthy red blood cells. Iron deficiency anemia is a condition in which the concentration of red blood cells or hemoglobin in the blood is below normal because of too little iron. Hemoglobin is a substance in red blood cells that carries oxygen to the body's  tissues. You may develop iron deficiency anemia due to:  Blood loss from an injury or condition such as Crohn's disease.  Your body being unable to properly absorb iron and use it to create red blood cells.  Lack of iron in your diet. You can prevent iron deficiency anemia by making certain changes to your diet and lifestyle. What nutrition changes can be made?  Eat foods that are high in iron, such as: ? Red meat, especially liver and beef. ? Poultry. ? Seafood. ? Dried fruit. ? Prune juice. ? Nuts. ? Pumpkin seeds. ? Beans. ? Leafy green vegetables. ? Molasses. ? Tofu.  Look for foods that have added iron (are fortified). Many cereals and breads are iron fortified.  Eat foods that contain vitamin C along with iron-rich foods, preferably in the same meal. Vitamin C increases your body's ability to absorb iron. Foods high in vitamin C include: ? Citrus fruits, such as lemons, oranges, and grapefruits. ? Berries. ? Bell peppers. ? Tomatoes. ? Broccoli.  Do not follow a diet that is very low in fat or very high in fiber.  Do  not drink very large amounts of milk, tea, or coffee. What actions can I take to lower my risk? It is important to know whether you are at risk for iron deficiency anemia. Ask your health care provider if you need a blood test to measure your iron or red blood cells. You may have a higher risk for iron deficiency anemia if:  You are a woman and one of the following applies: ? You have heavy menstrual periods. ? You are pregnant. ? You are breastfeeding.  You have had bypass surgery for weight loss.  You have a digestive disorder such as Crohn's disease, irritable bowel syndrome, or celiac disease.  You regularly take antacids or acid-lowering medicines.  You follow a vegetarian or vegan diet. To lower your risk for iron deficiency:  If you are a vegetarian or vegan, talk with your health care provider or a dietitian about: ? Taking an iron  supplement. ? Adding more iron-rich foods to your diet.  Limit your use of antacids or acid-lowering medicines.  If you have heavy menstrual periods, are pregnant, or are breastfeeding, ask your health care provider about taking an iron supplement.  Work with your health care provider to manage conditions that can cause iron deficiency.  Take over-the-counter and prescription medicines only as told by your health care provider.  Keep all follow-up visits as told by your health care provider. This is important. Why are these changes important? It is important to make these changes so that you do not develop iron deficiency anemia. Iron-rich foods help your body produce more red blood cells and have more energy. What can happen if changes are not made? If you do not make these changes, you could develop iron deficiency anemia. If not treated, iron deficiency anemia can lead to serious health complications, including:  Long-term (chronic) fatigue.  Shortness of breath.  Abnormal heart rhythms.  Heart failure.  Uncomfortable sensations and an overwhelming urge to move your legs (restless legs syndrome).  Weakened disease-fighting system (immune system). Where to find more information Learn more about preventing iron deficiency from:  [place] [place]: [email or web address]-eaton.[email or web address]/  American Society of Hematology: www.hematology.org Contact a health care provider if you:  Develop symptoms of iron deficiency, including: ? Fatigue. ? Headache. ? Pale skin, lips, and nail beds. ? Poor appetite. ? Weakness. Summary  Iron deficiency anemia is a condition in which the concentration of red blood cells or hemoglobin in the blood is below normal because of too little iron.  You can help prevent iron deficiency anemia by eating more iron-rich foods, such as red meat, poultry, or tofu. Look for foods that have added iron (are fortified), such as cereals.  Eat foods that  contain vitamin C along with iron-rich foods, preferably in the same meal. Vitamin C increases the body's ability to absorb iron.  Ask your health care provider about your risk for iron deficiency anemia and whether a multivitamin or supplement may be right for you. This information is not intended to replace advice given to you by your health care provider. Make sure you discuss any questions you have with your health care provider. Document Revised: 01/26/2019 Document Reviewed: 08/04/2017 Elsevier Patient Education  2020 [place] Your Hypertension Hypertension is commonly called high blood pressure. This is when the force of your blood pressing against the walls of your arteries is too strong. Arteries are blood vessels that carry blood from your heart throughout your body. Hypertension forces  the heart to work harder to pump blood, and may cause the arteries to become narrow or stiff. Having untreated or uncontrolled hypertension can cause heart attack, stroke, kidney disease, and other problems. What are blood pressure readings? A blood pressure reading consists of a higher number over a lower number. Ideally, your blood pressure should be below 120/80. The first ("top") number is called the systolic pressure. It is a measure of the pressure in your arteries as your heart beats. The second ("bottom") number is called the diastolic pressure. It is a measure of the pressure in your arteries as the heart relaxes. What does my blood pressure reading mean? Blood pressure is classified into four stages. Based on your blood pressure reading, your health care provider may use the following stages to determine what type of treatment you need, if any. Systolic pressure and diastolic pressure are measured in a unit called mm Hg. Normal  Systolic pressure: below 120.  Diastolic pressure: below 80. Elevated  Systolic pressure: 120-129.  Diastolic pressure: below 80. Hypertension stage  1  Systolic pressure: 130-139.  Diastolic pressure: 80-89. Hypertension stage 2  Systolic pressure: 140 or above.  Diastolic pressure: 90 or above. What health risks are associated with hypertension? Managing your hypertension is an important responsibility. Uncontrolled hypertension can lead to:  A heart attack.  A stroke.  A weakened blood vessel (aneurysm).  Heart failure.  Kidney damage.  Eye damage.  Metabolic syndrome.  Memory and concentration problems. What changes can I make to manage my hypertension? Hypertension can be managed by making lifestyle changes and possibly by taking medicines. Your health care provider will help you make a plan to bring your blood pressure within a normal range. Eating and drinking   Eat a diet that is high in fiber and potassium, and low in salt (sodium), added sugar, and fat. An example eating plan is called the DASH (Dietary Approaches to Stop Hypertension) diet. To eat this way: ? Eat plenty of fresh fruits and vegetables. Try to fill half of your plate at each meal with fruits and vegetables. ? Eat whole grains, such as whole wheat pasta, brown rice, or whole grain bread. Fill about one quarter of your plate with whole grains. ? Eat low-fat diary products. ? Avoid fatty cuts of meat, processed or cured meats, and poultry with skin. Fill about one quarter of your plate with lean proteins such as fish, chicken without skin, beans, eggs, and tofu. ? Avoid premade and processed foods. These tend to be higher in sodium, added sugar, and fat.  Reduce your daily sodium intake. Most people with hypertension should eat less than 1,500 mg of sodium a day.  Limit alcohol intake to no more than 1 drink a day for nonpregnant women and 2 drinks a day for men. One drink equals 12 oz of beer, 5 oz of wine, or 1 oz of hard liquor. Lifestyle  Work with your health care provider to maintain a healthy body weight, or to lose weight. Ask what an  ideal weight is for you.  Get at least 30 minutes of exercise that causes your heart to beat faster (aerobic exercise) most days of the week. Activities may include walking, swimming, or biking.  Include exercise to strengthen your muscles (resistance exercise), such as weight lifting, as part of your weekly exercise routine. Try to do these types of exercises for 30 minutes at least 3 days a week.  Do not use any products that contain nicotine  or tobacco, such as cigarettes and e-cigarettes. If you need help quitting, ask your health care provider.  Control any long-term (chronic) conditions you have, such as high cholesterol or diabetes. Monitoring  Monitor your blood pressure at home as told by your health care provider. Your personal target blood pressure may vary depending on your medical conditions, your age, and other factors.  Have your blood pressure checked regularly, as often as told by your health care provider. Working with your health care provider  Review all the medicines you take with your health care provider because there may be side effects or interactions.  Talk with your health care provider about your diet, exercise habits, and other lifestyle factors that may be contributing to hypertension.  Visit your health care provider regularly. Your health care provider can help you create and adjust your plan for managing hypertension. Will I need medicine to control my blood pressure? Your health care provider may prescribe medicine if lifestyle changes are not enough to get your blood pressure under control, and if:  Your systolic blood pressure is 130 or higher.  Your diastolic blood pressure is 80 or higher. Take medicines only as told by your health care provider. Follow the directions carefully. Blood pressure medicines must be taken as prescribed. The medicine does not work as well when you skip doses. Skipping doses also puts you at risk for problems. Contact a  health care provider if:  You think you are having a reaction to medicines you have taken.  You have repeated (recurrent) headaches.  You feel dizzy.  You have swelling in your ankles.  You have trouble with your vision. Get help right away if:  You develop a severe headache or confusion.  You have unusual weakness or numbness, or you feel faint.  You have severe pain in your chest or abdomen.  You vomit repeatedly.  You have trouble breathing. Summary  Hypertension is when the force of blood pumping through your arteries is too strong. If this condition is not controlled, it may put you at risk for serious complications.  Your personal target blood pressure may vary depending on your medical conditions, your age, and other factors. For most people, a normal blood pressure is less than 120/80.  Hypertension is managed by lifestyle changes, medicines, or both. Lifestyle changes include weight loss, eating a healthy, low-sodium diet, exercising more, and limiting alcohol. This information is not intended to replace advice given to you by your health care provider. Make sure you discuss any questions you have with your health care provider. Document Revised: 01/26/2019 Document Reviewed: 09/01/2016 Elsevier Patient Education  [place].  High Cholesterol  High cholesterol is a condition in which the blood has high levels of a white, waxy, fat-like substance (cholesterol). The human body needs small amounts of cholesterol. The liver makes all the cholesterol that the body needs. Extra (excess) cholesterol comes from the food that we eat. Cholesterol is carried from the liver by the blood through the blood vessels. If you have high cholesterol, deposits (plaques) may build up on the walls of your blood vessels (arteries). Plaques make the arteries narrower and stiffer. Cholesterol plaques increase your risk for heart attack and stroke. Work with your health care provider to  keep your cholesterol levels in a healthy range. What increases the risk? This condition is more likely to develop in people who:  Eat foods that are high in animal fat (saturated fat) or cholesterol.  Are overweight.  Are not getting enough exercise.  Have a family history of high cholesterol. What are the signs or symptoms? There are no symptoms of this condition. How is this diagnosed? This condition may be diagnosed from the results of a blood test.  If you are older than age 67, your health care provider may check your cholesterol every 4-6 years.  You may be checked more often if you already have high cholesterol or other risk factors for heart disease. The blood test for cholesterol measures:  "Bad" cholesterol (LDL cholesterol). This is the main type of cholesterol that causes heart disease. The desired level for LDL is less than 100.  "Good" cholesterol (HDL cholesterol). This type helps to protect against heart disease by cleaning the arteries and carrying the LDL away. The desired level for HDL is 60 or higher.  Triglycerides. These are fats that the body can store or burn for energy. The desired number for triglycerides is lower than 150.  Total cholesterol. This is a measure of the total amount of cholesterol in your blood, including LDL cholesterol, HDL cholesterol, and triglycerides. A healthy number is less than 200. How is this treated? This condition is treated with diet changes, lifestyle changes, and medicines. Diet changes  This may include eating more whole grains, fruits, vegetables, nuts, and fish.  This may also include cutting back on red meat and foods that have a lot of added sugar. Lifestyle changes  Changes may include getting at least 40 minutes of aerobic exercise 3 times a week. Aerobic exercises include walking, biking, and swimming. Aerobic exercise along with a healthy diet can help you maintain a healthy weight.  Changes may also include  quitting smoking. Medicines  Medicines are usually given if diet and lifestyle changes have failed to reduce your cholesterol to healthy levels.  Your health care provider may prescribe a statin medicine. Statin medicines have been shown to reduce cholesterol, which can reduce the risk of heart disease. Follow these instructions at home: Eating and drinking If told by your health care provider:  Eat chicken (without skin), fish, veal, shellfish, ground [place] breast, and round or loin cuts of red meat.  Do not eat fried foods or fatty meats, such as hot dogs and salami.  Eat plenty of fruits, such as apples.  Eat plenty of vegetables, such as broccoli, potatoes, and carrots.  Eat beans, peas, and lentils.  Eat grains such as barley, rice, couscous, and bulgur wheat.  Eat pasta without cream sauces.  Use skim or nonfat milk, and eat low-fat or nonfat yogurt and cheeses.  Do not eat or drink whole milk, cream, ice cream, egg yolks, or hard cheeses.  Do not eat stick margarine or tub margarines that contain trans fats (also called partially hydrogenated oils).  Do not eat saturated tropical oils, such as coconut oil and palm oil.  Do not eat cakes, cookies, crackers, or other baked goods that contain trans fats.  General instructions  Exercise as directed by your health care provider. Increase your activity level with activities such as gardening, walking, and taking the stairs.  Take over-the-counter and prescription medicines only as told by your health care provider.  Do not use any products that contain nicotine or tobacco, such as cigarettes and e-cigarettes. If you need help quitting, ask your health care provider.  Keep all follow-up visits as told by your health care provider. This is important. Contact a health care provider if:  You are struggling to maintain a  healthy diet or weight.  You need help to start on an exercise program.  You need help to stop  smoking. Get help right away if:  You have chest pain.  You have trouble breathing. This information is not intended to replace advice given to you by your health care provider. Make sure you discuss any questions you have with your health care provider. Document Revised: 10/07/2017 Document Reviewed: 04/03/2016 Elsevier Patient Education  [place].

## 2020-06-04 NOTE — Progress Notes (Signed)
Subjective:     Paula Kelley is a 69 y.o. female and is here for a comprehensive physical exam. The patient reports no problems.  Pt states she is doing well overall.  Trying to increase her physical activity.  May given 5000 steps at work and 10,000 on the weekend.  Pt walking some with her husband.  Has h/o b/l TKR with Dr. Maureen Ralphs.  Pt states she needs to watch her portion control as she has gained weight.  Pt still working at [employer].  Pt has no plans to retire anytime soon as she enjoys working and took 8 yrs off to raise her kids when they were young.  Pt states she does not check her BP at home as she gets nervous thinking about it.  Notes BP initially elevated in office and she is nervous that she might get bad news.  Pt endorses family history of heart problems in her mother who died in her 89s and her MGM (lived to her 88s).  Pt also expresses concern as two co-workers had recent MIs.  Mammogram up-to-date. Next one scheduled for 10/2020.  Colonoscopy done 05/12/2016.  Social History   Socioeconomic History  . Marital status: Married    Spouse name: Not on file  . Number of children: Not on file  . Years of education: Not on file  . Highest education level: Not on file  Occupational History  . Occupation: retired  Tobacco Use  . Smoking status: Never Smoker  . Smokeless tobacco: Never Used  Vaping Use  . Vaping Use: Never used  Substance and Sexual Activity  . Alcohol use: Yes    Comment: social use  . Drug use: No  . Sexual activity: Not on file  Other Topics Concern  . Not on file  Social History Narrative   Work or School: works at [employer] and T at [employer] Situation: lives with husband      Spiritual Beliefs: methodist      Lifestyle: walks; trying to eat a healthy diet            Social Determinants of Radio broadcast assistant Strain:   . Difficulty of Paying Living Expenses:   Food Insecurity:   . Worried About Charity fundraiser in the Last  Year:   . Arboriculturist in the Last Year:   Transportation Needs:   . Film/video editor (Medical):   [name] Lack of Transportation (Non-Medical):   Physical Activity:   . Days of Exercise per Week:   . Minutes of Exercise per Session:   Stress:   . Feeling of Stress :   Social Connections:   . Frequency of Communication with Friends and Family:   . Frequency of Social Gatherings with Friends and Family:   . Attends Religious Services:   . Active Member of Clubs or Organizations:   . Attends Archivist Meetings:   [name] Marital Status:   Intimate Partner Violence:   . Fear of Current or Ex-Partner:   . Emotionally Abused:   [name] Physically Abused:   . Sexually Abused:    Health Maintenance  Topic Date Due  . COVID-19 Vaccine (1) Never done  . MAMMOGRAM  09/18/2019  . INFLUENZA VACCINE  05/18/2020  . TETANUS/TDAP  02/05/2025  . COLONOSCOPY  05/12/2026  . DEXA SCAN  Completed  . Hepatitis C Screening  Completed  . PNA vac Low Risk  Adult  Completed    The following portions of the patient's history were reviewed and updated as appropriate: allergies, current medications, past family history, past medical history, past social history, past surgical history and problem list.  Review of Systems Pertinent items noted in HPI and remainder of comprehensive ROS otherwise negative.   Objective:    BP 120/69   Pulse 77   Temp 98.1 F (36.7 C) (Oral)   Ht $R'5\' 3"'zy$  (1.6 m)   Wt 177 lb (80.3 kg)   SpO2 97%   BMI 31.35 kg/m  General appearance: alert, cooperative and no distress Head: Normocephalic, without obvious abnormality, atraumatic Eyes: conjunctivae/corneas clear. PERRL, EOM's intact. Fundi benign. Ears: normal TM's and external ear canals both ears Nose: Nares normal. Septum midline. Mucosa normal. No drainage or sinus tenderness. Throat: lips, mucosa, and tongue normal; teeth and gums normal Neck: no adenopathy, no carotid bruit, no JVD, supple, symmetrical,  trachea midline and thyroid not enlarged, symmetric, no tenderness/mass/nodules Lungs: clear to auscultation bilaterally Heart: regular rate and rhythm, S1, S2 normal, no murmur, click, rub or gallop Abdomen: soft, non-tender; bowel sounds normal; no masses,  no organomegaly Extremities: extremities normal, atraumatic, no cyanosis or edema Pulses: 2+ and symmetric Skin: Skin color, texture, turgor normal. No rashes or lesions Lymph nodes: Cervical, supraclavicular, and axillary nodes normal. Neurologic: Alert and oriented X 3, normal strength and tone. Normal symmetric reflexes. Normal coordination and gait    Assessment:    Healthy female exam.      Plan:      Anticipatory guidance given including wearing seatbelts, smoke detectors in the home, increasing physical activity, increasing p.o. intake of water and vegetables. -will obtain labs -mammogram scheduled 10/2020 -colonoscopy up to date. Done 05/12/2016 -Shingles vaccine completed to [facility] -Given handout -Next CPE in 1 year See After Visit Summary for Counseling Recommendations    Essential hypertension  -Controlled -Continue Norvasc-benazepril 5-20 mg daily -Discussed lifestyle modifications -Given handout - Plan: CMP with eGFR  Weight gain  -Discussed possible causes -Discussed lifestyle modifications including increasing physical activity and portion control -Given handout - Plan: Hemoglobin A1c, TSH, T4, free  Mixed hyperlipidemia  -Total cholesterol 210 and LDL 123 on 05/30/2019 -Discussed lifestyle modifications -Okay to continue OTC fish oil tabs -Given handout - Plan: Lipid Panel  Other iron deficiency anemia  History of chronic iron deficiency anemia -Okay to continue ferrous sulfate 325 daily.  Can take with vitamin C or orange juice to increase absorption -Discussed using MiraLAX as needed for constipation caused by ferrous sulfate pills. -Given handout - Plan: CBC (no diff), Iron and TIBC,  Ferritin  F/u prn in 4-6 months  Grier Mitts, MD

## 2020-06-04 NOTE — Addendum Note (Signed)
Addended by: Lerry Liner on: 06/04/2020 09:01 AM   Modules accepted: Orders

## 2020-06-05 LAB — CBC
HCT: 36.7 % (ref 35.0–45.0)
Hemoglobin: 12 g/dL (ref 11.7–15.5)
MCH: 28.9 pg (ref 27.0–33.0)
MCHC: 32.7 g/dL (ref 32.0–36.0)
MCV: 88.4 fL (ref 80.0–100.0)
MPV: 11.1 fL (ref 7.5–12.5)
Platelets: 249 10*3/uL (ref 140–400)
RBC: 4.15 10*6/uL (ref 3.80–5.10)
RDW: 12.4 % (ref 11.0–15.0)
WBC: 5.9 10*3/uL (ref 3.8–10.8)

## 2020-06-05 LAB — LIPID PANEL
Cholesterol: 198 mg/dL (ref <200)
HDL: 75 mg/dL (ref >=50)
LDL Cholesterol (Calc): 109 mg/dL (calc) — ABNORMAL HIGH
Non-HDL Cholesterol (Calc): 123 mg/dL (calc) (ref <130)
Total CHOL/HDL Ratio: 2.6 (calc) (ref <5.0)
Triglycerides: 47 mg/dL (ref <150)

## 2020-06-05 LAB — HEMOGLOBIN A1C
Hgb A1c MFr Bld: 5.2 % of total Hgb (ref <5.7)
Mean Plasma Glucose: 103 (calc)
eAG (mmol/L): 5.7 (calc)

## 2020-06-05 LAB — COMPLETE METABOLIC PANEL WITH GFR
AG Ratio: 1.7 (calc) (ref 1.0–2.5)
ALT: 9 U/L (ref 6–29)
AST: 17 U/L (ref 10–35)
Albumin: 4.5 g/dL (ref 3.6–5.1)
Alkaline phosphatase (APISO): 72 U/L (ref 37–153)
BUN: 11 mg/dL (ref 7–25)
CO2: 32 mmol/L (ref 20–32)
Calcium: 9.5 mg/dL (ref 8.6–10.4)
Chloride: 103 mmol/L (ref 98–110)
Creat: 0.76 mg/dL (ref 0.50–0.99)
GFR, Est African American: 93 mL/min/{1.73_m2} (ref >=60)
GFR, Est Non African American: 81 mL/min/{1.73_m2} (ref >=60)
Globulin: 2.7 g/dL (calc) (ref 1.9–3.7)
Glucose, Bld: 92 mg/dL (ref 65–99)
Potassium: 4.2 mmol/L (ref 3.5–5.3)
Sodium: 139 mmol/L (ref 135–146)
Total Bilirubin: 0.5 mg/dL (ref 0.2–1.2)
Total Protein: 7.2 g/dL (ref 6.1–8.1)

## 2020-06-05 LAB — FERRITIN: Ferritin: 751 ng/mL — ABNORMAL HIGH (ref 16–288)

## 2020-06-05 LAB — T4, FREE: Free T4: 1 ng/dL (ref 0.8–1.8)

## 2020-06-05 LAB — IRON, TOTAL/TOTAL IRON BINDING CAP
%SAT: 33 % (calc) (ref 16–45)
Iron: 92 ug/dL (ref 45–160)
TIBC: 277 mcg/dL (calc) (ref 250–450)

## 2020-06-05 LAB — TSH: TSH: 1.07 mIU/L (ref 0.40–4.50)

## 2021-06-05 ENCOUNTER — Ambulatory Visit (INDEPENDENT_AMBULATORY_CARE_PROVIDER_SITE_OTHER): Payer: BC Managed Care – PPO | Admitting: Family Medicine

## 2021-06-05 ENCOUNTER — Encounter: Payer: Self-pay | Admitting: Family Medicine

## 2021-06-05 ENCOUNTER — Other Ambulatory Visit: Payer: Self-pay

## 2021-06-05 VITALS — BP 132/70 | HR 92 | Temp 98.3°F | Ht 64.75 in | Wt 181.0 lb

## 2021-06-05 DIAGNOSIS — R635 Abnormal weight gain: Secondary | ICD-10-CM | POA: Diagnosis not present

## 2021-06-05 DIAGNOSIS — Z0000 Encounter for general adult medical examination without abnormal findings: Secondary | ICD-10-CM

## 2021-06-05 DIAGNOSIS — E782 Mixed hyperlipidemia: Secondary | ICD-10-CM

## 2021-06-05 DIAGNOSIS — I10 Essential (primary) hypertension: Secondary | ICD-10-CM

## 2021-06-05 LAB — LIPID PANEL
Cholesterol: 170 mg/dL (ref 0–200)
HDL: 72.8 mg/dL (ref >39.00)
LDL Cholesterol: 89 mg/dL (ref 0–99)
NonHDL: 96.75
Total CHOL/HDL Ratio: 2
Triglycerides: 40 mg/dL (ref 0.0–149.0)
VLDL: 8 mg/dL (ref 0.0–40.0)

## 2021-06-05 LAB — TSH: TSH: 1.05 u[IU]/mL (ref 0.35–5.50)

## 2021-06-05 LAB — BASIC METABOLIC PANEL
BUN: 17 mg/dL (ref 6–23)
CO2: 29 mEq/L (ref 19–32)
Calcium: 9.8 mg/dL (ref 8.4–10.5)
Chloride: 104 mEq/L (ref 96–112)
Creatinine, Ser: 0.86 mg/dL (ref 0.40–1.20)
GFR: 68.73 mL/min (ref >60.00)
Glucose, Bld: 86 mg/dL (ref 70–99)
Potassium: 4.2 mEq/L (ref 3.5–5.1)
Sodium: 140 mEq/L (ref 135–145)

## 2021-06-05 LAB — CBC WITH DIFFERENTIAL/PLATELET
Basophils Absolute: 0 10*3/uL (ref 0.0–0.1)
Basophils Relative: 0.2 % (ref 0.0–3.0)
Eosinophils Absolute: 0 10*3/uL (ref 0.0–0.7)
Eosinophils Relative: 0 % (ref 0.0–5.0)
HCT: 36.4 % (ref 36.0–46.0)
Hemoglobin: 11.8 g/dL — ABNORMAL LOW (ref 12.0–15.0)
Lymphocytes Relative: 25.5 % (ref 12.0–46.0)
Lymphs Abs: 1.4 10*3/uL (ref 0.7–4.0)
MCHC: 32.4 g/dL (ref 30.0–36.0)
MCV: 88.6 fl (ref 78.0–100.0)
Monocytes Absolute: 0.4 10*3/uL (ref 0.1–1.0)
Monocytes Relative: 6.7 % (ref 3.0–12.0)
Neutro Abs: 3.8 10*3/uL (ref 1.4–7.7)
Neutrophils Relative %: 67.6 % (ref 43.0–77.0)
Platelets: 262 10*3/uL (ref 150.0–400.0)
RBC: 4.11 Mil/uL (ref 3.87–5.11)
RDW: 13.9 % (ref 11.5–15.5)
WBC: 5.6 10*3/uL (ref 4.0–10.5)

## 2021-06-05 LAB — T4, FREE: Free T4: 0.81 ng/dL (ref 0.60–1.60)

## 2021-06-05 LAB — HEMOGLOBIN A1C: Hgb A1c MFr Bld: 5.3 % (ref 4.6–6.5)

## 2021-06-05 MED ORDER — AMLODIPINE BESY-BENAZEPRIL HCL 5-20 MG PO CAPS: 1.0000 | ORAL_CAPSULE | Freq: Every day | ORAL | 3 refills | Status: DC

## 2021-06-05 NOTE — Progress Notes (Signed)
Subjective:     Paula Kelley is a 70 y.o. female and is here for a comprehensive physical exam. Pt states she is doing well.  Headed to [place], [place] with her husband and daughter today for the wknd.  Pt still working in [employer] at [employer], but can retire at any time.  Pt walking daily for exercise.  Trying to lose weight.  Eating a lot of fried foods.  Had R heel/lateral foot pain, but it has improved since using shoe inserts.  Also getting new tennis shoes.  Up to date on immunizations- had shingles and COVID with boosters.  Social History   Socioeconomic History   Marital status: Married    Spouse name: Not on file   Number of children: Not on file   Years of education: Not on file   Highest education level: Not on file  Occupational History   Occupation: retired  Tobacco Use   Smoking status: Never   Smokeless tobacco: Never  Vaping Use   Vaping Use: Never used  Substance and Sexual Activity   Alcohol use: Yes    Comment: social use   Drug use: No   Sexual activity: Not on file  Other Topics Concern   Not on file  Social History Narrative   Work or School: works at [employer] and T at [employer] Situation: lives with husband      Spiritual Beliefs: methodist      Lifestyle: walks; trying to eat a healthy diet            Social Determinants of Corporate investment banker Strain: Not on file  Food Insecurity: Not on file  Transportation Needs: Not on file  Physical Activity: Not on file  Stress: Not on file  Social Connections: Not on file  Intimate Partner Violence: Not on file   Health Maintenance  Topic Date Due   Zoster Vaccines- Shingrix (1 of 2) Never done   MAMMOGRAM  09/18/2019   COVID-19 Vaccine (3 - Booster for Pfizer series) 04/28/2020   INFLUENZA VACCINE  05/18/2021   TETANUS/TDAP  02/05/2025   COLONOSCOPY (Pts 45-43yrs Insurance coverage will need to be confirmed)  05/12/2026   DEXA SCAN  Completed   Hepatitis C Screening  Completed   PNA vac  Low Risk Adult  Completed   HPV VACCINES  Aged Out    The following portions of the patient's history were reviewed and updated as appropriate: allergies, current medications, past family history, past medical history, past social history, past surgical history, and problem list.  Review of Systems Pertinent items noted in HPI and remainder of comprehensive ROS otherwise negative.   Objective:    BP 132/70 (BP Location: Right Arm, Patient Position: Sitting, Cuff Size: Normal)   Pulse 92   Temp 98.3 F (36.8 C) (Oral)   Ht 5' 4.75" (1.645 m)   Wt 181 lb (82.1 kg)   SpO2 98%   BMI 30.35 kg/m  General appearance: alert, cooperative, and no distress Head: Normocephalic, without obvious abnormality, atraumatic Eyes: conjunctivae/corneas clear. PERRL, EOM's intact. Fundi benign. Ears: normal TM's and external ear canals both ears Nose: Nares normal. Septum midline. Mucosa normal. No drainage or sinus tenderness. Throat: lips, mucosa, and tongue normal; teeth and gums normal Neck: no adenopathy, no carotid bruit, no JVD, supple, symmetrical, trachea midline, and thyroid not enlarged, symmetric, no tenderness/mass/nodules Lungs: clear to auscultation bilaterally Heart: regular rate and rhythm, S1, S2 normal, no  murmur, click, rub or gallop Abdomen: soft, non-tender; bowel sounds normal; no masses,  no organomegaly Extremities: extremities normal, atraumatic, no cyanosis or edema Pulses: 2+ and symmetric Skin: Skin color, texture, turgor normal. No rashes or lesions Lymph nodes: Cervical, supraclavicular, and axillary nodes normal. Neurologic: Alert and oriented X 3, normal strength and tone. Normal symmetric reflexes. Normal coordination and gait    Assessment:    Healthy female exam.      Plan:    Anticipatory guidance given including wearing seatbelts, smoke detectors in the home, increasing physical activity, increasing p.o. intake of water and vegetables. -obtain labs -pt to  schedule mammogram -colonoscopy up to date -immunizations up to date -given handout -next CPE in 1 yr See After Visit Summary for Counseling Recommendations   Mixed hyperlipidemia  -lifestyle modifications - Plan: Lipid panel  Essential hypertension  -controlled -continue amlodipine-benazepril 5-20 mg daily -Lifestyle modifications -Patient encouraged to check BP at home and keep a log to bring her to clinic - Plan: CBC with Differential/Platelet, Basic metabolic panel, amLODipine-benazepril (LOTREL) 5-20 MG capsule  Weight gain  -BMI 30.337 -Discussed importance of lifestyle modifications - Plan: TSH, T4, Free, Hemoglobin A1c  F/u 4-6 months  Abbe Amsterdam, MD

## 2021-06-10 ENCOUNTER — Telehealth: Payer: Self-pay

## 2021-06-10 NOTE — Telephone Encounter (Signed)
Results have been printed out, and placed in outgoing mail.

## 2021-06-10 NOTE — Telephone Encounter (Signed)
Patient wasn't successful at setting up her mychart account. Wanted to know if someone is able to mail out her lab results to the address on file.

## 2021-06-12 ENCOUNTER — Telehealth: Payer: Self-pay | Admitting: Family Medicine

## 2021-06-12 NOTE — Telephone Encounter (Signed)
Called patient, no answer. Left vm that the previous vm was to let her know about her lab results. Per her request, the results were printed and mailed to address on file.

## 2021-06-12 NOTE — Telephone Encounter (Signed)
Pt call and stated she is returning your call and stated you can call her back at home and leave a message are call her on her cell ph.

## 2021-07-08 ENCOUNTER — Ambulatory Visit: Payer: BC Managed Care – PPO | Admitting: Podiatry

## 2021-07-08 ENCOUNTER — Other Ambulatory Visit: Payer: Self-pay

## 2021-07-08 ENCOUNTER — Ambulatory Visit (INDEPENDENT_AMBULATORY_CARE_PROVIDER_SITE_OTHER): Payer: BC Managed Care – PPO

## 2021-07-08 DIAGNOSIS — M778 Other enthesopathies, not elsewhere classified: Secondary | ICD-10-CM

## 2021-07-08 DIAGNOSIS — M7671 Peroneal tendinitis, right leg: Secondary | ICD-10-CM

## 2021-07-08 DIAGNOSIS — M79671 Pain in right foot: Secondary | ICD-10-CM

## 2021-07-08 DIAGNOSIS — M722 Plantar fascial fibromatosis: Secondary | ICD-10-CM

## 2021-07-08 MED ORDER — TRIAMCINOLONE ACETONIDE 10 MG/ML IJ SUSP
20.0000 mg | Freq: Once | INTRAMUSCULAR | Status: AC
  Administered 2021-07-08: 20 mg

## 2021-07-08 MED ORDER — DICLOFENAC SODIUM 75 MG PO TBEC: 75.0000 mg | DELAYED_RELEASE_TABLET | Freq: Two times a day (BID) | ORAL | 2 refills | Status: DC

## 2021-07-08 NOTE — Patient Instructions (Signed)

## 2021-07-09 NOTE — Progress Notes (Signed)
Subjective:   Patient ID: Paula Kelley, female   DOB: 69 y.o.   MRN: [id number]   HPI Patient presents stating she is having a lot of pain in her right foot in 2 different areas and is also started to recently develop pain in the top of the left foot.  States the right foot has been hurting for a number of months starting on the outside and then going to the heel.  Patient does not smoke likes to be active   Review of Systems  All other systems reviewed and are negative.      Objective:  Physical Exam Vitals and nursing note reviewed.  Constitutional:      Appearance: She is well-developed.  Pulmonary:     Effort: Pulmonary effort is normal.  Musculoskeletal:        General: Normal range of motion.  Skin:    General: Skin is warm.  Neurological:     Mental Status: She is alert.    Neurovascular status found to be intact muscle strength was found to be adequate range of motion subtalar midtarsal joint adequate.  Patient is found to have inflammation with fluid buildup around the base of the fifth metatarsal right with swelling around the area and pain with no muscle strength loss and is found to have some significant discomfort on the plantar aspect of the right heel at the insertion.  Left foot hurts on top moderate in intensity but appears to be more compensatory.  Good digital perfusion well oriented x3     Assessment:  Acute peroneal tendinitis right and also plantar fasciitis right with inflammation fluid buildup and extensor tendinitis left     Plan:  H&P reviewed all conditions and discussed.  At this point I have recommended treating the right foot and hoping the left will get better on its own.  I went ahead today I did sterile prep and I injected the peroneal insertion 3 mg Dexasone Kenalog 5 mg Xylocaine and I did sterile prep and injected the plantar fascial insertion 3 mg Kenalog 5 mg Xylocaine and then applied fascial brace to lift up the right arch.  Instructed for  ice on the left shoe gear modification and placed on oral diclofenac.  Reappoint 3 weeks or earlier if needed  X-rays indicate that there is mild spur formation and soft tissue swelling around the fifth metatarsal base with no indication of fracture or other bony pathology

## 2021-07-14 ENCOUNTER — Other Ambulatory Visit: Payer: Self-pay | Admitting: Podiatry

## 2021-07-14 DIAGNOSIS — M722 Plantar fascial fibromatosis: Secondary | ICD-10-CM

## 2021-07-14 DIAGNOSIS — M7671 Peroneal tendinitis, right leg: Secondary | ICD-10-CM

## 2021-07-29 ENCOUNTER — Ambulatory Visit: Payer: BC Managed Care – PPO | Admitting: Podiatry

## 2021-07-29 ENCOUNTER — Other Ambulatory Visit: Payer: Self-pay

## 2021-07-29 ENCOUNTER — Encounter: Payer: Self-pay | Admitting: Podiatry

## 2021-07-29 DIAGNOSIS — M722 Plantar fascial fibromatosis: Secondary | ICD-10-CM

## 2021-07-29 DIAGNOSIS — M7671 Peroneal tendinitis, right leg: Secondary | ICD-10-CM

## 2021-07-29 NOTE — Progress Notes (Signed)
Subjective:   Patient ID: Paula Kelley, female   DOB: 70 y.o.   MRN: [id number]   HPI Patient presents stating that overall she is improved but she does have moderate flatfoot deformity and is interested in some type of support in order to give her better reduction of foot and leg discomfort neuro   ROS      Objective:  Physical Exam  Vascular status intact with patient's right plantar fascial peroneal tendon both functioning better with pain still noted but improved from previous     Assessment:  Overall doing well pleased with reduction of deformity but still having mild discomfort and does have significant flatfoot deformity     Plan:  H&P reviewed condition recommended the continuation of supportive therapy and orthotics and patient at this point is casted for functional orthotics to reduce the chronic stress on her feet.  Patient is overall satisfied and will be seen back for [place] to recheck

## 2021-08-20 ENCOUNTER — Telehealth: Payer: Self-pay | Admitting: Podiatry

## 2021-08-20 NOTE — Telephone Encounter (Signed)
Orthotics in.. lvm for pt to call to schedule an appt to pick them up. °

## 2021-12-01 LAB — HM MAMMOGRAPHY

## 2021-12-09 ENCOUNTER — Ambulatory Visit: Payer: BC Managed Care – PPO | Admitting: Family Medicine

## 2021-12-09 ENCOUNTER — Other Ambulatory Visit: Payer: Self-pay | Admitting: Family Medicine

## 2021-12-09 ENCOUNTER — Encounter: Payer: Self-pay | Admitting: Family Medicine

## 2021-12-09 VITALS — BP 157/100 | HR 85 | Temp 98.5°F | Wt 178.0 lb

## 2021-12-09 DIAGNOSIS — I10 Essential (primary) hypertension: Secondary | ICD-10-CM | POA: Diagnosis not present

## 2021-12-09 DIAGNOSIS — J309 Allergic rhinitis, unspecified: Secondary | ICD-10-CM

## 2021-12-09 MED ORDER — LEVOCETIRIZINE DIHYDROCHLORIDE 5 MG PO TABS: 5.0000 mg | ORAL_TABLET | Freq: Every evening | ORAL | 1 refills | Status: DC

## 2021-12-09 MED ORDER — FLUTICASONE PROPIONATE 50 MCG/ACT NA SUSP: 1.0000 | Freq: Every day | NASAL | 3 refills | Status: DC

## 2021-12-09 NOTE — Progress Notes (Signed)
Subjective:    Patient ID: Paula Kelley, female    DOB: 1950/12/07, 71 y.o.   MRN: [id number]  Chief Complaint  Patient presents with   Follow-up    6 month f/u -BP, weight loss     HPI Patient was seen today for follow-up and acute concern.  Patient endorses issues x1-2 weeks.  Symptoms initially started with nasal congestion, rhinorrhea, sore throat, cough.  Nasal congestion remains.  Patient tried Mucinex and NyQuil for symptoms.  Has Nettie pot at home not been using.  Patient denies facial pain/pressure, ear pain/pressure, sore throat, cough, headache.  Patient states she is "allergic to everything" after allergy testing several years ago.  Not currently taking an antihistamine.  Patient states BP was good 2 weeks ago at OB/GYN visit.  Patient does not recall the top number but states diastolic was 80.  Endorses taking amlodipine-benazepril 5-20 mg daily regularly.  Took meds this morning.  BP was previously controlled.  Patient does not recall increased sodium intake.  Endorses eating more candy 2/2 Valentine's Day.  Patient had mammogram done 2 weeks ago during OB/GYN visit.  Past Medical History:  Diagnosis Date   Allergic rhinitis    Anxiety    DJD (degenerative joint disease)    Sees Dr. Beckie Salts)    Hypertension    Mild anemia    iron def, reports on iron her whole life   Obesity    URI (upper respiratory infection)    UTI (lower urinary tract infection)     Allergies  Allergen Reactions   Sulfa Antibiotics    Sulfonamide Derivatives     REACTION: unsure of reaction---this was as a child    ROS General: Denies fever, chills, night sweats, changes in weight, changes in appetite HEENT: Denies headaches, ear pain, changes in vision, rhinorrhea, sore throat +nasal congestion CV: Denies CP, palpitations, SOB, orthopnea Pulm: Denies SOB, cough, wheezing GI: Denies abdominal pain, nausea, vomiting, diarrhea, constipation GU: Denies dysuria, hematuria,  frequency, vaginal discharge Msk: Denies muscle cramps, joint pains Neuro: Denies weakness, numbness, tingling Skin: Denies rashes, bruising Psych: Denies depression, anxiety, hallucinations    Objective:    Blood pressure (!) 170/103, pulse 85, temperature 98.5 F (36.9 C), temperature source Oral, weight 178 lb (80.7 kg), SpO2 99 %.  Repeat BP 157/100 right arm sitting.  Gen. Pleasant, well-nourished, in no distress, normal affect   HEENT: [place]/AT, face symmetric, conjunctiva clear, no scleral icterus, PERRLA, EOMI, nares patent without drainage Lungs: no accessory muscle use, CTAB, no wheezes or rales Cardiovascular: RRR, no m/r/g, no peripheral edema Musculoskeletal: No deformities, no cyanosis or clubbing, normal tone Neuro:  A&Ox3, CN II-XII intact, normal gait Skin:  Warm, no lesions/ rash  Wt Readings from Last 3 Encounters:  12/09/21 178 lb (80.7 kg)  06/05/21 181 lb (82.1 kg)  06/04/20 177 lb (80.3 kg)    Lab Results  Component Value Date   WBC 5.6 06/05/2021   HGB 11.8 (L) 06/05/2021   HCT 36.4 06/05/2021   PLT 262.0 06/05/2021   GLUCOSE 86 06/05/2021   CHOL 170 06/05/2021   TRIG 40.0 06/05/2021   HDL 72.80 06/05/2021   LDLCALC 89 06/05/2021   ALT 9 06/04/2020   AST 17 06/04/2020   NA 140 06/05/2021   K 4.2 06/05/2021   CL 104 06/05/2021   CREATININE 0.86 06/05/2021   BUN 17 06/05/2021   CO2 29 06/05/2021   TSH 1.05 06/05/2021   INR 0.94 10/27/2017  HGBA1C 5.3 06/05/2021    Assessment/Plan:  Essential hypertension -Uncontrolled. -Discussed possible causes for elevation in BP including increased sodium intake, stress, medications such as decongestants, noncompliance, etc. -Patient advised to make lifestyle modifications.  Education given. -Discussed using OTC cough/cold medication for people with high blood pressure -We will have patient monitor BP at home and keep a log over the next week.  Patient to bring BP monitor and log to next office  visit -For now continue Norvasc-benazepril 5-20 mg daily.  Advised will likely need to increase medication for continued elevation consistently greater than 140/90.  Allergic rhinitis, unspecified seasonality, unspecified trigger -Patient advised to use OTC cough/cold medications for people with high blood pressure -Will start Xyzal and Flonase for current allergy symptoms. - Plan: levocetirizine (XYZAL ALLERGY 24HR) 5 MG tablet, fluticasone (FLONASE) 50 MCG/ACT nasal spray  F/u in 1 week for HTN, sooner if needed  Grier Mitts, MD

## 2021-12-09 NOTE — Patient Instructions (Signed)
You should get a blood pressure cuff to check your blood pressure at home.  These can be found at your local drugstore, [employer], [employer], or online.  Using cough and cold medication make sure you are getting an HBP version for people with high blood pressure as this will not increase your blood pressure like some of the other decongestant medications.  You can ask your pharmacist for help in locating these products on the shelf at your local drugstore.  A prescription for Xyzal and Flonase were sent to your pharmacy.  Xyzal is an allergy pill.  And Flonase as a nasal spray to help with your sinus drainage.  We will have you follow back up next week to check on your blood pressure and see how you are feeling.

## 2021-12-10 ENCOUNTER — Ambulatory Visit: Payer: BC Managed Care – PPO | Admitting: Family Medicine

## 2021-12-17 ENCOUNTER — Encounter: Payer: Self-pay | Admitting: Family Medicine

## 2021-12-17 ENCOUNTER — Ambulatory Visit (INDEPENDENT_AMBULATORY_CARE_PROVIDER_SITE_OTHER): Payer: BC Managed Care – PPO | Admitting: Family Medicine

## 2021-12-17 VITALS — BP 136/86 | HR 95

## 2021-12-17 DIAGNOSIS — J309 Allergic rhinitis, unspecified: Secondary | ICD-10-CM

## 2021-12-17 DIAGNOSIS — I10 Essential (primary) hypertension: Secondary | ICD-10-CM | POA: Diagnosis not present

## 2021-12-17 MED ORDER — FLUTICASONE PROPIONATE 50 MCG/ACT NA SUSP: 1.0000 | Freq: Every day | NASAL | 11 refills | Status: DC

## 2021-12-17 MED ORDER — LEVOCETIRIZINE DIHYDROCHLORIDE 5 MG PO TABS: 5.0000 mg | ORAL_TABLET | Freq: Every evening | ORAL | 3 refills | Status: DC

## 2021-12-17 NOTE — Progress Notes (Signed)
Subjective:  ? ? Patient ID: Paula Kelley, female    DOB: July 26, 1951, 71 y.o.   MRN: [id number] ? ?Chief Complaint  ?Patient presents with  ? Follow-up  ?  HTN; has been taking them at home.  ?2/22: 170/103 ?2/23: 123/84 & 118/78 & 112/77 ?2/25: 103/74, 100/72, 106/71 ?2/26: 116/83, 118/81, 119/85 ?2/27: 114/79, 120/78, 108/72 ?2/28: 107/79, 98/75, 98/74 ?3/1: 101/77, 96/73, 99/71  ? ? ?HPI ?Patient was seen today for f/u on HTN. Seen 12/09/21, for HTN/hypertensive urgency as bp was 107/103, 157/100.  At that time pt with cough/cold medications 2/2 allergic rhinitis symptoms.  Since last office visit patient started taking Flonase and Xyzal daily which has helped allergy symptoms.  Requesting refills.  Patient no longer getting sinus headaches daily.  Taking Norvasc-benazepril 5-20 mg daily.  States BP at home 101/77, 96/73, 99/71, 114/79, 120/78, 100/72, 108/72, 119/85.  BP was 123/84 at health center.  Patient denies cough, rhinorrhea, headaches, CP, changes in vision. ? ?Past Medical History:  ?Diagnosis Date  ? Allergic rhinitis   ? Anxiety   ? DJD (degenerative joint disease)   ? Sees Dr. Dierdre Forth  ? Headache(784.0)   ? Hypertension   ? Mild anemia   ? iron def, reports on iron her whole life  ? Obesity   ? URI (upper respiratory infection)   ? UTI (lower urinary tract infection)   ? ? ?Allergies  ?Allergen Reactions  ? Sulfa Antibiotics   ? Sulfonamide Derivatives   ?  REACTION: unsure of reaction---this was as a child  ? ? ?ROS ?General: Denies fever, chills, night sweats, changes in weight, changes in appetite ?HEENT: Denies headaches, ear pain, changes in vision, rhinorrhea, sore throat ?CV: Denies CP, palpitations, SOB, orthopnea ?Pulm: Denies SOB, cough, wheezing ?GI: Denies abdominal pain, nausea, vomiting, diarrhea, constipation ?GU: Denies dysuria, hematuria, frequency, vaginal discharge ?Msk: Denies muscle cramps, joint pains ?Neuro: Denies weakness, numbness, tingling ?Skin: Denies rashes,  bruising ?Psych: Denies depression, anxiety, hallucinations ?   ?Objective:  ?  ?Blood pressure 136/86, pulse 95, SpO2 99 %. ? ?Gen. Pleasant, well-nourished, in no distress, normal affect   ?HEENT: [place]/AT, face symmetric, conjunctiva clear, no scleral icterus, PERRLA, EOMI, nares patent without drainage ?Lungs: no accessory muscle use, CTAB, no wheezes or rales ?Cardiovascular: RRR, no m/r/g, no peripheral edema ?Musculoskeletal: No deformities, no cyanosis or clubbing, normal tone ?Neuro:  A&Ox3, CN II-XII intact, normal gait ?Skin:  Warm, no lesions/ rash ? ? ?Wt Readings from Last 3 Encounters:  ?12/09/21 178 lb (80.7 kg)  ?06/05/21 181 lb (82.1 kg)  ?06/04/20 177 lb (80.3 kg)  ? ? ?Lab Results  ?Component Value Date  ? WBC 5.6 06/05/2021  ? HGB 11.8 (L) 06/05/2021  ? HCT 36.4 06/05/2021  ? PLT 262.0 06/05/2021  ? GLUCOSE 86 06/05/2021  ? CHOL 170 06/05/2021  ? TRIG 40.0 06/05/2021  ? HDL 72.80 06/05/2021  ? LDLCALC 89 06/05/2021  ? ALT 9 06/04/2020  ? AST 17 06/04/2020  ? NA 140 06/05/2021  ? K 4.2 06/05/2021  ? CL 104 06/05/2021  ? CREATININE 0.86 06/05/2021  ? BUN 17 06/05/2021  ? CO2 29 06/05/2021  ? TSH 1.05 06/05/2021  ? INR 0.94 10/27/2017  ? HGBA1C 5.3 06/05/2021  ? ? ?Assessment/Plan: ? ?Essential hypertension ?-Controlled ?-Previous elevation in BP likely 2/2 OTC cough/cold medications, stress, and whitecoat syndrome. ?-Continue Norvasc-benazepril 5-20 mg daily ?-Continue lifestyle modifications ?-Continue checking BP at home and keeping a log to bring with you to  clinic.  Reviewed proper technique when checking BP including resting feet flat on floor, not talking, relaxing, resting arm at appropriate level, etc. ? ?Allergic rhinitis, unspecified seasonality, unspecified trigger ?-Controlled ?-Continue Xyzal and Flonase daily.  Discussed r/b/a of daily Flonase use. ?-As allergies improved/weather stops fluctuating consider using Flonase as needed or saline nasal rinse. ?- Plan: fluticasone (FLONASE) 50  MCG/ACT nasal spray, levocetirizine (XYZAL ALLERGY 24HR) 5 MG tablet ? ?F/u in 4-6 months, sooner if needed.  Patient to schedule CPE in August 2023. ? ?Abbe Amsterdam, MD ?

## 2021-12-17 NOTE — Patient Instructions (Addendum)
Your last physical was 06/05/2021.  You can set up an appointment for this year before you leave. ?

## 2021-12-18 ENCOUNTER — Encounter: Payer: Self-pay | Admitting: Family Medicine

## 2022-04-09 ENCOUNTER — Emergency Department (HOSPITAL_BASED_OUTPATIENT_CLINIC_OR_DEPARTMENT_OTHER): Payer: BC Managed Care – PPO

## 2022-04-09 ENCOUNTER — Encounter (HOSPITAL_BASED_OUTPATIENT_CLINIC_OR_DEPARTMENT_OTHER): Payer: Self-pay

## 2022-04-09 ENCOUNTER — Emergency Department (HOSPITAL_BASED_OUTPATIENT_CLINIC_OR_DEPARTMENT_OTHER)
Admission: EM | Admit: 2022-04-09 | Discharge: 2022-04-09 | Disposition: A | Discharge status: Home / Self Care | Payer: BC Managed Care – PPO | Attending: Emergency Medicine | Admitting: Emergency Medicine

## 2022-04-09 ENCOUNTER — Other Ambulatory Visit: Payer: Self-pay

## 2022-04-09 DIAGNOSIS — Z96653 Presence of artificial knee joint, bilateral: Secondary | ICD-10-CM | POA: Diagnosis not present

## 2022-04-09 DIAGNOSIS — R42 Dizziness and giddiness: Secondary | ICD-10-CM | POA: Insufficient documentation

## 2022-04-09 DIAGNOSIS — Z79899 Other long term (current) drug therapy: Secondary | ICD-10-CM | POA: Diagnosis not present

## 2022-04-09 DIAGNOSIS — I10 Essential (primary) hypertension: Secondary | ICD-10-CM | POA: Insufficient documentation

## 2022-04-09 DIAGNOSIS — H538 Other visual disturbances: Secondary | ICD-10-CM | POA: Diagnosis not present

## 2022-04-09 DIAGNOSIS — H539 Unspecified visual disturbance: Secondary | ICD-10-CM

## 2022-04-09 DIAGNOSIS — X501XXA Overexertion from prolonged static or awkward postures, initial encounter: Secondary | ICD-10-CM | POA: Insufficient documentation

## 2022-04-09 DIAGNOSIS — S199XXA Unspecified injury of neck, initial encounter: Secondary | ICD-10-CM | POA: Diagnosis present

## 2022-04-09 DIAGNOSIS — S161XXA Strain of muscle, fascia and tendon at neck level, initial encounter: Secondary | ICD-10-CM | POA: Diagnosis not present

## 2022-04-09 DIAGNOSIS — Z7982 Long term (current) use of aspirin: Secondary | ICD-10-CM | POA: Diagnosis not present

## 2022-04-09 LAB — CBC WITH DIFFERENTIAL/PLATELET
Abs Immature Granulocytes: 0.04 10*3/uL (ref 0.00–0.07)
Basophils Absolute: 0 10*3/uL (ref 0.0–0.1)
Basophils Relative: 0 %
Eosinophils Absolute: 0 10*3/uL (ref 0.0–0.5)
Eosinophils Relative: 0 %
HCT: 38.7 % (ref 36.0–46.0)
Hemoglobin: 12.3 g/dL (ref 12.0–15.0)
Immature Granulocytes: 0 %
Lymphocytes Relative: 11 %
Lymphs Abs: 1.5 10*3/uL (ref 0.7–4.0)
MCH: 28.9 pg (ref 26.0–34.0)
MCHC: 31.8 g/dL (ref 30.0–36.0)
MCV: 90.8 fL (ref 80.0–100.0)
Monocytes Absolute: 0.6 10*3/uL (ref 0.1–1.0)
Monocytes Relative: 4 %
Neutro Abs: 11.7 10*3/uL — ABNORMAL HIGH (ref 1.7–7.7)
Neutrophils Relative %: 85 %
Platelets: 268 10*3/uL (ref 150–400)
RBC: 4.26 MIL/uL (ref 3.87–5.11)
RDW: 13.2 % (ref 11.5–15.5)
WBC: 13.9 10*3/uL — ABNORMAL HIGH (ref 4.0–10.5)
nRBC: 0 % (ref 0.0–0.2)

## 2022-04-09 LAB — URINALYSIS, ROUTINE W REFLEX MICROSCOPIC
Bilirubin Urine: NEGATIVE
Glucose, UA: NEGATIVE mg/dL
Hgb urine dipstick: NEGATIVE
Ketones, ur: NEGATIVE mg/dL
Leukocytes,Ua: NEGATIVE
Nitrite: NEGATIVE
Protein, ur: NEGATIVE mg/dL
Specific Gravity, Urine: 1.027 (ref 1.005–1.030)
pH: 6.5 (ref 5.0–8.0)

## 2022-04-09 LAB — BASIC METABOLIC PANEL
Anion gap: 10 (ref 5–15)
BUN: 23 mg/dL (ref 8–23)
CO2: 29 mmol/L (ref 22–32)
Calcium: 10.3 mg/dL (ref 8.9–10.3)
Chloride: 100 mmol/L (ref 98–111)
Creatinine, Ser: 0.97 mg/dL (ref 0.44–1.00)
GFR, Estimated: >60 mL/min (ref >60)
Glucose, Bld: 110 mg/dL — ABNORMAL HIGH (ref 70–99)
Potassium: 3.8 mmol/L (ref 3.5–5.1)
Sodium: 139 mmol/L (ref 135–145)

## 2022-04-09 LAB — MAGNESIUM: Magnesium: 2.2 mg/dL (ref 1.7–2.4)

## 2022-04-09 LAB — TROPONIN I (HIGH SENSITIVITY): Troponin I (High Sensitivity): 4 ng/L (ref <18)

## 2022-04-09 MED ORDER — LIDOCAINE 5 % EX PTCH: 1.0000 | MEDICATED_PATCH | Freq: Every day | CUTANEOUS | 0 refills | Status: DC | PRN

## 2022-04-09 MED ORDER — LIDOCAINE 5 % EX PTCH
1.0000 | MEDICATED_PATCH | CUTANEOUS | Status: DC
  Administered 2022-04-09: 1 via TRANSDERMAL
  Filled 2022-04-09: qty 1

## 2022-04-09 MED ORDER — SODIUM CHLORIDE 0.9 % IV BOLUS
500.0000 mL | Freq: Once | INTRAVENOUS | Status: AC
  Administered 2022-04-09: 500 mL via INTRAVENOUS

## 2022-04-09 MED ORDER — IOHEXOL 350 MG/ML SOLN
100.0000 mL | Freq: Once | INTRAVENOUS | Status: AC | PRN
  Administered 2022-04-09: 75 mL via INTRAVENOUS

## 2022-04-12 ENCOUNTER — Encounter: Payer: Self-pay | Admitting: Family Medicine

## 2022-04-12 ENCOUNTER — Ambulatory Visit (INDEPENDENT_AMBULATORY_CARE_PROVIDER_SITE_OTHER): Payer: BC Managed Care – PPO | Admitting: Family Medicine

## 2022-04-12 VITALS — BP 140/98 | HR 79 | Temp 98.3°F | Wt 179.8 lb

## 2022-04-12 DIAGNOSIS — J302 Other seasonal allergic rhinitis: Secondary | ICD-10-CM

## 2022-04-12 DIAGNOSIS — I700 Atherosclerosis of aorta: Secondary | ICD-10-CM | POA: Diagnosis not present

## 2022-04-12 DIAGNOSIS — I10 Essential (primary) hypertension: Secondary | ICD-10-CM

## 2022-04-12 DIAGNOSIS — M542 Cervicalgia: Secondary | ICD-10-CM | POA: Diagnosis not present

## 2022-04-12 DIAGNOSIS — H538 Other visual disturbances: Secondary | ICD-10-CM | POA: Diagnosis not present

## 2022-04-12 DIAGNOSIS — S161XXD Strain of muscle, fascia and tendon at neck level, subsequent encounter: Secondary | ICD-10-CM

## 2022-05-12 ENCOUNTER — Encounter: Payer: Self-pay | Admitting: Family Medicine

## 2022-05-12 ENCOUNTER — Ambulatory Visit: Payer: BC Managed Care – PPO | Admitting: Family Medicine

## 2022-05-12 VITALS — BP 150/88 | HR 79 | Temp 98.4°F | Wt 177.8 lb

## 2022-05-12 DIAGNOSIS — I10 Essential (primary) hypertension: Secondary | ICD-10-CM | POA: Diagnosis not present

## 2022-05-12 NOTE — Progress Notes (Signed)
Subjective:    Patient ID: Paula Kelley, female    DOB: 27-Jan-1951, 71 y.o.   MRN: [id number]  Chief Complaint  Patient presents with   Follow-up   Hypertension    HPI Patient was seen today for HTN.  Taking amlodipine-benazepril 5-20 mg daily.  Patient denies headaches, CP, changes in vision, LE edema.  BP at home 127/78, 116/73, 118/76, 146/93, 140/94, 147/91, 120/78, 97/66, 95/67, 122/78, 106/71.  Patient has home BP cuff with her to this visit.  Patient working on drinking more water.  Does not use salt when cooking food.  Has lost a few pounds since last visit.  States clothes are fitting better.  Patient states she gets nervous during office visits.  Past Medical History:  Diagnosis Date   Allergic rhinitis    Anxiety    DJD (degenerative joint disease)    Sees Dr. Haze Boyden)    Hypertension    Mild anemia    iron def, reports on iron her whole life   Obesity    URI (upper respiratory infection)    UTI (lower urinary tract infection)     Allergies  Allergen Reactions   Sulfa Antibiotics    Sulfonamide Derivatives     REACTION: unsure of reaction---this was as a child    ROS General: Denies fever, chills, night sweats, changes in weight, changes in appetite HEENT: Denies headaches, ear pain, changes in vision, rhinorrhea, sore throat CV: Denies CP, palpitations, SOB, orthopnea Pulm: Denies SOB, cough, wheezing GI: Denies abdominal pain, nausea, vomiting, diarrhea, constipation GU: Denies dysuria, hematuria, frequency, vaginal discharge Msk: Denies muscle cramps, joint pains Neuro: Denies weakness, numbness, tingling Skin: Denies rashes, bruising Psych: Denies depression, anxiety, hallucinations     Objective:    Blood pressure 140/90, pulse 79, temperature 98.4 F (36.9 C), temperature source Oral, weight 177 lb 12.8 oz (80.6 kg), SpO2 99 %.  Gen. Pleasant, well-nourished, in no distress, normal affect   HEENT: [place]/AT, face symmetric,  conjunctiva clear, no scleral icterus, PERRLA, EOMI, nares patent without drainage Lungs: no accessory muscle use Cardiovascular: RRR, no peripheral edema Musculoskeletal: No deformities, no cyanosis or clubbing, normal tone Neuro:  A&Ox3, CN II-XII intact, normal gait Skin:  Warm, no lesions/ rash   Wt Readings from Last 3 Encounters:  05/12/22 177 lb 12.8 oz (80.6 kg)  04/12/22 179 lb 12.8 oz (81.6 kg)  04/09/22 177 lb (80.3 kg)    Lab Results  Component Value Date   WBC 13.9 (H) 04/09/2022   HGB 12.3 04/09/2022   HCT 38.7 04/09/2022   PLT 268 04/09/2022   GLUCOSE 110 (H) 04/09/2022   CHOL 170 06/05/2021   TRIG 40.0 06/05/2021   HDL 72.80 06/05/2021   LDLCALC 89 06/05/2021   ALT 9 06/04/2020   AST 17 06/04/2020   NA 139 04/09/2022   K 3.8 04/09/2022   CL 100 04/09/2022   CREATININE 0.97 04/09/2022   BUN 23 04/09/2022   CO2 29 04/09/2022   TSH 1.05 06/05/2021   INR 0.94 10/27/2017   HGBA1C 5.3 06/05/2021    Assessment/Plan:  Essential hypertension -Elevated in office 2/2 whitecoat hypertension.  Controlled as BP readings at home consistently less than 140/90. -Discussed ways to decrease anxiety regarding BP at office visits. -Continue amlodipine-benazepril 5-20 mg daily. -Continue lifestyle modifications  F/u next month for CPE.  Abbe Amsterdam, MD

## 2022-06-07 ENCOUNTER — Encounter: Payer: Self-pay | Admitting: Family Medicine

## 2022-06-07 ENCOUNTER — Ambulatory Visit (INDEPENDENT_AMBULATORY_CARE_PROVIDER_SITE_OTHER): Payer: BC Managed Care – PPO | Admitting: Family Medicine

## 2022-06-07 VITALS — BP 132/88 | HR 84 | Temp 98.6°F | Ht 66.0 in | Wt 178.0 lb

## 2022-06-07 DIAGNOSIS — I10 Essential (primary) hypertension: Secondary | ICD-10-CM | POA: Diagnosis not present

## 2022-06-07 DIAGNOSIS — E782 Mixed hyperlipidemia: Secondary | ICD-10-CM

## 2022-06-07 DIAGNOSIS — I700 Atherosclerosis of aorta: Secondary | ICD-10-CM

## 2022-06-07 DIAGNOSIS — Z0000 Encounter for general adult medical examination without abnormal findings: Secondary | ICD-10-CM | POA: Diagnosis not present

## 2022-06-07 DIAGNOSIS — Z780 Asymptomatic menopausal state: Secondary | ICD-10-CM

## 2022-06-07 LAB — LIPID PANEL
Cholesterol: 177 mg/dL (ref 0–200)
HDL: 66 mg/dL (ref >39.00)
LDL Cholesterol: 100 mg/dL — ABNORMAL HIGH (ref 0–99)
NonHDL: 110.62
Total CHOL/HDL Ratio: 3
Triglycerides: 55 mg/dL (ref 0.0–149.0)
VLDL: 11 mg/dL (ref 0.0–40.0)

## 2022-06-07 LAB — BASIC METABOLIC PANEL
BUN: 16 mg/dL (ref 6–23)
CO2: 29 mEq/L (ref 19–32)
Calcium: 9.8 mg/dL (ref 8.4–10.5)
Chloride: 103 mEq/L (ref 96–112)
Creatinine, Ser: 0.82 mg/dL (ref 0.40–1.20)
GFR: 72.26 mL/min (ref >60.00)
Glucose, Bld: 97 mg/dL (ref 70–99)
Potassium: 4.3 mEq/L (ref 3.5–5.1)
Sodium: 140 mEq/L (ref 135–145)

## 2022-06-07 LAB — CBC WITH DIFFERENTIAL/PLATELET
Basophils Absolute: 0 10*3/uL (ref 0.0–0.1)
Basophils Relative: 0.7 % (ref 0.0–3.0)
Eosinophils Absolute: 0 10*3/uL (ref 0.0–0.7)
Eosinophils Relative: 0 % (ref 0.0–5.0)
HCT: 37.7 % (ref 36.0–46.0)
Hemoglobin: 12.2 g/dL (ref 12.0–15.0)
Lymphocytes Relative: 27.7 % (ref 12.0–46.0)
Lymphs Abs: 1.6 10*3/uL (ref 0.7–4.0)
MCHC: 32.3 g/dL (ref 30.0–36.0)
MCV: 89.3 fl (ref 78.0–100.0)
Monocytes Absolute: 0.4 10*3/uL (ref 0.1–1.0)
Monocytes Relative: 6.5 % (ref 3.0–12.0)
Neutro Abs: 3.8 10*3/uL (ref 1.4–7.7)
Neutrophils Relative %: 65.1 % (ref 43.0–77.0)
Platelets: 269 10*3/uL (ref 150.0–400.0)
RBC: 4.22 Mil/uL (ref 3.87–5.11)
RDW: 14.2 % (ref 11.5–15.5)
WBC: 5.9 10*3/uL (ref 4.0–10.5)

## 2022-06-07 LAB — HEMOGLOBIN A1C: Hgb A1c MFr Bld: 5.4 % (ref 4.6–6.5)

## 2022-06-07 LAB — T4, FREE: Free T4: 0.81 ng/dL (ref 0.60–1.60)

## 2022-06-07 LAB — TSH: TSH: 1.43 u[IU]/mL (ref 0.35–5.50)

## 2022-06-07 MED ORDER — AMLODIPINE BESY-BENAZEPRIL HCL 5-20 MG PO CAPS: 1.0000 | ORAL_CAPSULE | Freq: Every day | ORAL | 3 refills | Status: DC

## 2022-06-07 NOTE — Progress Notes (Signed)
Complete physical exam  Patient: Paula Kelley   DOB: July 21, 1951   71 y.o. Female  MRN: [id number]  Subjective:    Chief Complaint  Patient presents with   Annual Exam    Paula Kelley is a 71 y.o. female who presents today for a complete physical exam. She reports consuming a low sodium diet.  Pt walks at least 5k-10k steps per day at work.  She generally feels well.  Checking BP at home, has logs: BP 123/80, 105/75, 100/71, 114/76, 102/72, 108/76, 117/79, 119/79, 130/93, 125/80, 96/70, 104/67, 107/72, 111/74.  Patient denies headaches, changes in vision, CP, presyncope.  Pt increasing water intake.  Not really a breakfast person.  Patient notes improvement in allergy symptoms since taking Xyzal daily and using Flonase as needed every few days.  No longer having frequent sinus headaches.  Mammogram up-to-date done 12/01/2021.  Followed by OB/GYN.   Most recent fall risk assessment:    06/07/2022    8:14 AM  Fall Risk   Falls in the past year? 0  Number falls in past yr: 0  Injury with Fall? 0  Risk for fall due to : No Fall Risks  Follow up Falls evaluation completed     Most recent depression screenings:    06/07/2022    8:14 AM 05/12/2022    1:13 PM  PHQ 2/9 Scores  PHQ - 2 Score 0 0  PHQ- 9 Score 1 0    Patient Active Problem List   Diagnosis Date Noted   Mixed hyperlipidemia 06/04/2020   History of total knee replacement, bilateral 06/04/2020   Anemia 11/01/2008   Allergic rhinitis 11/01/2008   Essential hypertension 09/03/2008   OA (osteoarthritis) of knee 09/03/2008   Past Medical History:  Diagnosis Date   Allergic rhinitis    Anxiety    DJD (degenerative joint disease)    Sees Dr. Haze Boyden)    Hypertension    Mild anemia    iron def, reports on iron her whole life   Obesity    URI (upper respiratory infection)    UTI (lower urinary tract infection)    Allergies  Allergen Reactions   Sulfa Antibiotics    Sulfonamide Derivatives      REACTION: unsure of reaction---this was as a child      Patient Care Team: Deeann Saint, MD as PCP - General (Family Medicine) Donnetta Hail, MD as Consulting Physician (Rheumatology) Carrington Clamp, MD as Consulting Physician (Obstetrics and Gynecology)   Outpatient Medications Prior to Visit  Medication Sig   amLODipine-benazepril (LOTREL) 5-20 MG capsule Take 1 capsule by mouth daily.   Ascorbic Acid (VITAMIN C) 100 MG CHEW Vitamin C   aspirin 81 MG chewable tablet Aspir-81   aspirin 81 MG tablet Take 81 mg by mouth daily.   Calcium 500-100 MG-UNIT CHEW Calcium 500   diclofenac (VOLTAREN) 75 MG EC tablet Take 1 tablet (75 mg total) by mouth 2 (two) times daily.   ferrous sulfate 325 (65 FE) MG tablet Take 325 mg by mouth daily with breakfast.   fluticasone (FLONASE) 50 MCG/ACT nasal spray Place 1 spray into both nostrils daily.   Iron-Vitamin C (IRON 100/C) 100-250 MG TABS iron   levocetirizine (XYZAL ALLERGY 24HR) 5 MG tablet Take 1 tablet (5 mg total) by mouth every evening.   lidocaine (LIDODERM) 5 % Place 1 patch onto the skin daily as needed. Remove & Discard patch within 12 hours or as directed by  MD   Multiple Vitamins-Minerals (CENTRUM SILVER ULTRA WOMENS PO) centrum silver ultra wmns tabs  TK 1 T PO QD   Omega-3 Fatty Acids (FISH OIL) 1000 MG CAPS Fish Oil   OVER THE COUNTER MEDICATION Natural fiber supplement daily   Potassium 99 MG TABS Take 99 mg by mouth daily.   Turmeric (QC TUMERIC COMPLEX PO) Take 2 tablets by mouth daily.   No facility-administered medications prior to visit.    ROS        Objective:     BP 132/88 (BP Location: Left Arm, Patient Position: Sitting, Cuff Size: Normal)   Pulse 84   Temp 98.6 F (37 C) (Oral)   Ht 5\' 6"  (1.676 m)   Wt 178 lb (80.7 kg)   SpO2 96%   BMI 28.73 kg/m  BP Readings from Last 3 Encounters:  06/07/22 132/88  05/12/22 (!) 150/88  04/12/22 (!) 140/98      Physical Exam Gen. Pleasant, well  developed, well-nourished, in NAD HEENT - [place]/AT, PERRL, EOMI, conjunctive clear, no scleral icterus, no nasal drainage, pharynx without erythema or exudate.  TMs normal bilaterally.  No cervical lymphadenopathy. Neck: No JVD, no thyromegaly, no carotid bruits Lungs: no use of accessory muscles, CTAB, no wheezes, rales or rhonchi Cardiovascular: RRR,  No r/g/m, no peripheral edema Abdomen: BS present, soft, nontender,nondistended, no hepatosplenomegaly Musculoskeletal: No deformities, moves all four extremities, no cyanosis or clubbing, normal tone Neuro:  A&Ox3, CN II-XII intact, normal gait Skin:  Warm, dry, intact, no lesions Psych: normal affect, mood appropriate    No results found for any visits on 06/07/22.     Assessment & Plan:    Routine Health Maintenance and Physical Exam  Immunization History  Administered Date(s) Administered   Influenza Split 07/19/2011, 08/03/2012, 08/10/2013   Influenza Whole 07/27/2010   Influenza, High Dose Seasonal PF 08/03/2019   Influenza,inj,quad, With Preservative 08/18/2017   Influenza-Unspecified 07/18/2014, 07/25/2015, 08/21/2015, 07/18/2016, 07/18/2017, 07/18/2018, 08/01/2020, 07/18/2021   PFIZER Comirnaty(Gray Top)Covid-19 Tri-Sucrose Vaccine 02/09/2021   PFIZER(Purple Top)SARS-COV-2 Vaccination 11/08/2019, 11/30/2019, 06/23/2020   Pneumococcal Conjugate-13 09/02/2016   Pneumococcal Polysaccharide-23 10/04/2017   Tdap 02/06/2015   Zoster Recombinat (Shingrix) 03/12/2020, 05/11/2020    Health Maintenance  Topic Date Due   COVID-19 Vaccine (5 - Pfizer series) 04/06/2021   INFLUENZA VACCINE  05/18/2022   MAMMOGRAM  12/02/2023   TETANUS/TDAP  02/05/2025   COLONOSCOPY (Pts 45-11yrs Insurance coverage will need to be confirmed)  05/12/2026   Pneumonia Vaccine 79+ Years old  Completed   DEXA SCAN  Completed   Hepatitis C Screening  Completed   Zoster Vaccines- Shingrix  Completed   HPV VACCINES  Aged Out    Discussed health  benefits of physical activity, and encouraged her to engage in regular exercise appropriate for her age and condition.  Problem List Items Addressed This Visit       Cardiovascular and Mediastinum   Essential hypertension   Relevant Medications   amLODipine-benazepril (LOTREL) 5-20 MG capsule   Other Relevant Orders   CBC with Differential/Platelet   Basic metabolic panel   TSH   T4, Free     Other   Mixed hyperlipidemia   Relevant Medications   amLODipine-benazepril (LOTREL) 5-20 MG capsule   Other Relevant Orders   Hemoglobin A1c   Lipid panel   Other Visit Diagnoses     Well adult exam    -  Primary   Aortic atherosclerosis (HCC)       Relevant Medications  amLODipine-benazepril (LOTREL) 5-20 MG capsule   Other Relevant Orders   Lipid panel   Asymptomatic postmenopausal state       Relevant Orders   DG Bone Density      Return in about 6 months (around 12/08/2022).     Deeann Saint, MD

## 2022-06-09 ENCOUNTER — Telehealth: Payer: Self-pay | Admitting: Family Medicine

## 2022-06-09 NOTE — Telephone Encounter (Signed)
Pt states she had another episode at work but was able to drive herself home.  Pt was evaluated by EMS and cleared.  Please advise [phone]  Pt stated she was last here on 06/07/22 (CPE)

## 2022-06-11 ENCOUNTER — Telehealth: Payer: Self-pay | Admitting: Family Medicine

## 2022-06-11 ENCOUNTER — Ambulatory Visit: Payer: BC Managed Care – PPO | Admitting: Family Medicine

## 2022-06-11 VITALS — BP 136/80 | HR 112 | Temp 98.3°F | Wt 175.8 lb

## 2022-06-11 DIAGNOSIS — I10 Essential (primary) hypertension: Secondary | ICD-10-CM | POA: Diagnosis not present

## 2022-06-11 DIAGNOSIS — H538 Other visual disturbances: Secondary | ICD-10-CM | POA: Diagnosis not present

## 2022-06-11 DIAGNOSIS — I493 Ventricular premature depolarization: Secondary | ICD-10-CM | POA: Diagnosis not present

## 2022-06-11 DIAGNOSIS — R55 Syncope and collapse: Secondary | ICD-10-CM | POA: Diagnosis not present

## 2022-06-11 DIAGNOSIS — N312 Flaccid neuropathic bladder, not elsewhere classified: Secondary | ICD-10-CM

## 2022-06-11 LAB — POCT URINALYSIS DIPSTICK
Bilirubin, UA: NEGATIVE
Blood, UA: NEGATIVE
Glucose, UA: NEGATIVE
Ketones, UA: NEGATIVE
Leukocytes, UA: NEGATIVE
Nitrite, UA: NEGATIVE
Protein, UA: NEGATIVE
Spec Grav, UA: 1.02 (ref 1.010–1.025)
Urobilinogen, UA: 0.2 E.U./dL
pH, UA: 6 (ref 5.0–8.0)

## 2022-06-11 MED ORDER — AMLODIPINE BESY-BENAZEPRIL HCL 5-10 MG PO CAPS: 1.0000 | ORAL_CAPSULE | Freq: Every day | ORAL | 1 refills | Status: DC

## 2022-06-11 NOTE — Progress Notes (Signed)
Subjective:    Patient ID: Paula Kelley, female    DOB: [birth year], 71 y.o.   MRN: [id number]  Chief Complaint  Patient presents with   Blurred Vision    "For a while"; does not know what precipitates it. States episodes are intermittent & last approx 3-4 minutes. Has been seen at [facility] for this.   Urinary Incontinence    Has happened twice since in last 3 months  Pt's husband joins visit.  HPI Patient is a 71 year old female with pmh sig for HTN, allergies, OA s/p bilateral TKR, history of anemia, HLD who was seen today for acute concern.  Pt states she had an episode of blurred vision while sitting talking to her boss after walking across [employer] on 8/23 around 2pm.  EMS was called.  Pt states she was told her vs were good.  Pt declined EMS transport/further evaluation, drove herself home.  Pt mentions she urinated on herself during the time of the incident.   Pt's home BP readings the day of the incident were 108/80, 112/69, 104/70, 105/68 per patient's home readings.  Patient states she ate yogurt, had coffee, tea and some water that day.    Per patient's husband, pt passed out, her boss caught her, and she urinated on herself.    This is patient's third episode of blurred vision over the last 6 months.  Patient seen in ED on 04/12/2022 for an episode.   Past Medical History:  Diagnosis Date   Allergic rhinitis    Anxiety    DJD (degenerative joint disease)    Sees Dr. Haze Boyden)    Hypertension    Mild anemia    iron def, reports on iron her whole life   Obesity    URI (upper respiratory infection)    UTI (lower urinary tract infection)     Allergies  Allergen Reactions   Sulfa Antibiotics    Sulfonamide Derivatives     REACTION: unsure of reaction---this was as a child    ROS General: Denies fever, chills, night sweats, changes in weight, changes in appetite +syncope HEENT: Denies headaches, ear pain, changes in vision, rhinorrhea, sore throat +blurred  vision CV: Denies CP, palpitations, SOB, orthopnea Pulm: Denies SOB, cough, wheezing GI: Denies abdominal pain, nausea, vomiting, diarrhea, constipation +loss of bladder GU: Denies dysuria, hematuria, frequency, vaginal discharge Msk: Denies muscle cramps, joint pains Neuro: Denies weakness, numbness, tingling Skin: Denies rashes, bruising Psych: Denies depression, anxiety, hallucinations     Objective:    Blood pressure 136/80, pulse (!) 112, temperature 98.3 F (36.8 C), temperature source Oral, weight 175 lb 12.8 oz (79.7 kg), SpO2 99 %.  Gen. Pleasant, well-nourished, in no distress, normal affect   HEENT: [place]/AT, face symmetric, conjunctiva clear, no scleral icterus, PERRLA, EOMI, nares patent without drainage, pharynx without erythema or exudate. Neck: No JVD, no thyromegaly, no carotid bruits Lungs: no accessory muscle use, CTAB, no wheezes or rales Cardiovascular: Skipping beats, no m/r/g, no peripheral edema Musculoskeletal: No deformities, no cyanosis or clubbing, normal tone Neuro:  A&Ox3, CN II-XII intact, normal gait Skin:  Warm, no lesions/ rash   Wt Readings from Last 3 Encounters:  06/11/22 175 lb 12.8 oz (79.7 kg)  06/07/22 178 lb (80.7 kg)  05/12/22 177 lb 12.8 oz (80.6 kg)    Lab Results  Component Value Date   WBC 5.9 06/07/2022   HGB 12.2 06/07/2022   HCT 37.7 06/07/2022   PLT 269.0 06/07/2022   GLUCOSE  97 06/07/2022   CHOL 177 06/07/2022   TRIG 55.0 06/07/2022   HDL 66.00 06/07/2022   LDLCALC 100 (H) 06/07/2022   ALT 9 06/04/2020   AST 17 06/04/2020   NA 140 06/07/2022   K 4.3 06/07/2022   CL 103 06/07/2022   CREATININE 0.82 06/07/2022   BUN 16 06/07/2022   CO2 29 06/07/2022   TSH 1.43 06/07/2022   INR 0.94 10/27/2017   HGBA1C 5.4 06/07/2022    Assessment/Plan:  Blurred vision  - Plan: EKG 12-Lead, POCT urinalysis dipstick, Ambulatory referral to Cardiology, Ambulatory referral to Neurology  PVCs (premature ventricular  contractions) -Noted on EKG this visit -Otherwise normal EKG.  EKG from 04/09/2022 reviewed for comparison. - Plan: Ambulatory referral to Cardiology  Essential hypertension -BP in office tends to be normal/mildly elevated -Given low normal BPs at home we will decrease medication -We will discontinue amlodipine-benazepril 5-20 mg daily. -Start amlodipine-benazepril 5-10 mg daily. -Patient to continue monitoring BP at home and keeping log of readings to bring with her to clinic. - Plan: EKG 12-Lead, amLODipine-benazepril (LOTREL) 5-10 MG capsule, Ambulatory referral to Cardiology  Syncope, unspecified syncope type -CT angio head neck with without contrast on 04/09/2022 reviewed.  CT head no evidence of acute intercranial abnormality.  Mild paranasal sinus disease.  CTA neck: Common carotid, internal carotid, and vertebral arteries are patent within the neck without stenosis or significant atherosclerotic disease.  Aortic atherosclerosis.  Cervical spondylosis.  CTA head: No intracranial large vessel occlusion of the proximal high-grade arterial stenosis.  Minimal nonstenotic calcified plaque within the intracranial internal carotid arteries. -MRI brain without contrast on 04/09/2022 reviewed.  Negative MR of head mild sinus mucosal disease. - Plan: EKG 12-Lead, POCT urinalysis dipstick, Ambulatory referral to Cardiology, Ambulatory referral to Neurology  Loss of muscle tone of bladder -UA normal this visit -Concern for seizure - Plan: Ambulatory referral to Neurology  Discussed multiple factors possibly contributing to patient's recent syncopal episode including hypotension, dehydration, hypoglycemia, arrhythmia.  Hypoglycemia seemingly less likely as at the time of incident EMS reported normal blood sugar, though actual blood sugar reading unknown.  Concern for seizure given loss of bladder during incident.  Referral placed for Neurology.  Numerous PVCs noted on EKG this visit and heart beat  irregular on exam.  Patient asymptomatic.  Prior EKG used for comparison was normal.  Discussed referral to cardiology for further evaluation.  Most will consider TIA as cause of symptoms.  Labs including cholesterol not obtained today as done earlier this week on 06/07/2022 at time of CPE.    F/u in 2-3 weeks  Abbe Amsterdam, MD

## 2022-06-11 NOTE — Patient Instructions (Addendum)
As we discussed decreasing her blood pressure medication today and a new prescription was sent to the pharmacy.  The new prescription is for amlodipine-benazepril (Lotrel) 5-10 mg daily.  Continue monitoring your blood pressure at home.  If it continues to remain low we may need make further changes to your medication such as using 1 medication instead of combination pill with 2 medications in it.  As discussed your EKG has what are called premature ventricular complexes (PVCs).  Typically these do not cause issues but for some people they feel or notice the change in the heart rate.  A referral to cardiology was placed along with additional studies.  You should expect a phone call about scheduling these appointments.  Because of the loss of bladder during this episode a referral to the neurologist was placed to rule out seizures.

## 2022-06-11 NOTE — Telephone Encounter (Signed)
[facility] called to confirm the dosage for the   amLODipine-benazepril (LOTREL) 5-10 MG capsule Start Date:  06/11/22  This is because on 06/07/22 they received the Rx for  amLODipine-benazepril (LOTREL) 5-20 MG capsule [[id number]]    Pharmacy was informed that it was "DISCONTINUED"  - according to Medication List.  Please advise.

## 2022-06-11 NOTE — Telephone Encounter (Signed)
Notes from today's visit: As we discussed decreasing her blood pressure medication today and a new prescription was sent to the pharmacy.  The new prescription is for amlodipine-benazepril (Lotrel) 5-10 mg daily.  Order clarified with pharmacist.

## 2022-06-14 ENCOUNTER — Ambulatory Visit: Payer: BC Managed Care – PPO | Attending: Internal Medicine | Admitting: Internal Medicine

## 2022-06-14 ENCOUNTER — Encounter: Payer: Self-pay | Admitting: Internal Medicine

## 2022-06-14 VITALS — BP 140/88 | HR 73 | Ht 65.0 in | Wt 178.2 lb

## 2022-06-14 DIAGNOSIS — R011 Cardiac murmur, unspecified: Secondary | ICD-10-CM

## 2022-06-14 DIAGNOSIS — R55 Syncope and collapse: Secondary | ICD-10-CM | POA: Diagnosis not present

## 2022-06-14 NOTE — Patient Instructions (Signed)
Medication Instructions:  The current medical regimen is effective;  continue present plan and medications.  *If you need a refill on your cardiac medications before your next appointment, please call your pharmacy*   Testing/Procedures: Echocardiogram - Your physician has requested that you have an echocardiogram. Echocardiography is a painless test that uses sound waves to create images of your heart. It provides your doctor with information about the size and shape of your heart and how well your heart's chambers and valves are working. This procedure takes approximately one hour. There are no restrictions for this procedure.     Follow-Up: At [facility], you and your health needs are our priority.  As part of our continuing mission to provide you with exceptional heart care, we have created designated Provider Care Teams.  These Care Teams include your primary Cardiologist (physician) and Advanced Practice Providers (APPs -  Physician Assistants and Nurse Practitioners) who all work together to provide you with the care you need, when you need it.  We recommend signing up for the patient portal called "MyChart".  Sign up information is provided on this After Visit Summary.  MyChart is used to connect with patients for Virtual Visits (Telemedicine).  Patients are able to view lab/test results, encounter notes, upcoming appointments, etc.  Non-urgent messages can be sent to your provider as well.   To learn more about what you can do with MyChart, go to [email or web address].    Your next appointment:   As needed  The format for your next appointment:   In Person  Provider:   Carolan Clines, MD

## 2022-06-14 NOTE — Progress Notes (Signed)
Cardiology Office Note:    Date:  06/14/2022   ID:  Paula Kelley, DOB November 17, 1950, MRN [id number]  PCP:  Deeann Saint, MD   [facility] Providers Cardiologist:  None     Referring MD: Deeann Saint, MD   No chief complaint on file. Syncope  History of Present Illness:    Paula Kelley is a 71 y.o. female with a hx of anxiety, HTN, referral from Dr. Salomon Fick for recent syncopal event. She denies fainting. Her husband notes she has. She does not recall.  She also notes blurry vision. She says images get blurry. CT angio did not show carotid dx.  Brain MRI was negative. She has no hx of CAD.  She denies CP or SOB. She denies palpitations, LH or dizziness  No orthopnea or PND  No LE edema  Cardiology hx: no prior stress test. No echocardiogram  Family Hx: mother passed in her 30s ? MI, father and GM with MI  Social Hx: no smoking. She's still working  Past Medical History:  Diagnosis Date   Allergic rhinitis    Anxiety    DJD (degenerative joint disease)    Sees Dr. Dierdre Forth   Headache(784.0)    Hypertension    Mild anemia    iron def, reports on iron her whole life   Obesity    URI (upper respiratory infection)    UTI (lower urinary tract infection)     Past Surgical History:  Procedure Laterality Date   COLONOSCOPY     svd     2 live births   TOTAL KNEE ARTHROPLASTY Left 11/01/2016   Procedure: LEFT TOTAL KNEE ARTHROPLASTY;  Surgeon: Ollen Gross, MD;  Location: [facility];  Service: Orthopedics;  Laterality: Left;  Adductor Block   TOTAL KNEE ARTHROPLASTY Right 10/31/2017   Procedure: RIGHT TOTAL KNEE ARTHROPLASTY;  Surgeon: Ollen Gross, MD;  Location: [facility];  Service: Orthopedics;  Laterality: Right;  50 mins    Current Medications: Current Outpatient Medications on File Prior to Visit  Medication Sig Dispense Refill   amLODipine-benazepril (LOTREL) 5-10 MG capsule Take 1 capsule by mouth daily. 90 capsule 1   Ascorbic Acid (VITAMIN C) 100 MG  CHEW Vitamin C     aspirin 81 MG chewable tablet Aspir-81     Calcium 500-100 MG-UNIT CHEW Calcium 500     diclofenac (VOLTAREN) 75 MG EC tablet Take 1 tablet (75 mg total) by mouth 2 (two) times daily. 50 tablet 2   ferrous sulfate 325 (65 FE) MG tablet Take 325 mg by mouth daily with breakfast.     fluticasone (FLONASE) 50 MCG/ACT nasal spray Place 1 spray into both nostrils daily. 18.2 g 11   levocetirizine (XYZAL ALLERGY 24HR) 5 MG tablet Take 1 tablet (5 mg total) by mouth every evening. 90 tablet 3   Omega-3 Fatty Acids (FISH OIL) 1000 MG CAPS Fish Oil     Turmeric (QC TUMERIC COMPLEX PO) Take 2 tablets by mouth daily.     No current facility-administered medications on file prior to visit.     Allergies:   Sulfa antibiotics and Sulfonamide derivatives   Social History   Socioeconomic History   Marital status: Married    Spouse name: Not on file   Number of children: Not on file   Years of education: Not on file   Highest education level: Bachelor's degree (e.g., BA, AB, BS)  Occupational History   Occupation: retired  Tobacco Use   Smoking  status: Never   Smokeless tobacco: Never  Vaping Use   Vaping Use: Never used  Substance and Sexual Activity   Alcohol use: Yes    Comment: social use   Drug use: No   Sexual activity: Not on file  Other Topics Concern   Not on file  Social History Narrative   Work or School: works at [employer] and T at [employer]: lives with husband      Spiritual Beliefs: methodist      Lifestyle: walks; trying to eat a healthy diet            Social Determinants of Health   Financial Resource Strain: Low Risk  (05/11/2022)   Overall Financial Resource Strain (CARDIA)    Difficulty of Paying Living Expenses: Not hard at all  Food Insecurity: Unknown (05/11/2022)   Hunger Vital Sign    Worried About Running Out of Food in the Last Year: Never true    [place] in the Last Year: Patient refused  Transportation Needs: No  Transportation Needs (05/11/2022)   PRAPARE - Hydrologist (Medical): No    Lack of Transportation (Non-Medical): No  Physical Activity: Sufficiently Active (05/11/2022)   Exercise Vital Sign    Days of Exercise per Week: 5 days    Minutes of Exercise per Session: 30 min  Stress: Unknown (05/11/2022)   [place]    Feeling of Stress : Patient refused  Social Connections: Socially Integrated (05/11/2022)   Social Connection and Isolation Panel [NHANES]    Frequency of Communication with Friends and Family: More than three times a week    Frequency of Social Gatherings with Friends and Family: Patient refused    Attends Religious Services: More than 4 times per year    Active Member of [employer] or Organizations: Yes    Attends Archivist Meetings: 1 to 4 times per year    Marital Status: Married     Family History: The patient's family history includes Heart disease in her father and mother. There is no history of Colon cancer.  ROS:   Please see the history of present illness.     All other systems reviewed and are negative.  EKGs/Labs/Other Studies Reviewed:    The following studies were reviewed today:   EKG:  EKG is  ordered today.  The ekg ordered today demonstrates   06/14/2022- NSR  Recent Labs: 04/09/2022: Magnesium 2.2 06/07/2022: BUN 16; Creatinine, Ser 0.82; Hemoglobin 12.2; Platelets 269.0; Potassium 4.3; Sodium 140; TSH 1.43   Recent Lipid Panel    Component Value Date/Time   CHOL 177 06/07/2022 0848   TRIG 55.0 06/07/2022 0848   HDL 66.00 06/07/2022 0848   CHOLHDL 3 06/07/2022 0848   VLDL 11.0 06/07/2022 0848   LDLCALC 100 (H) 06/07/2022 0848   LDLCALC 109 (H) 06/04/2020 0900     Risk Assessment/Calculations:     Physical Exam:    VS:  Vitals:   06/14/22 1448  BP: (!) 140/88  Pulse: 73  SpO2: 97%     Wt Readings from Last 3 Encounters:   06/14/22 178 lb 3.2 oz (80.8 kg)  06/11/22 175 lb 12.8 oz (79.7 kg)  06/07/22 178 lb (80.7 kg)     GEN:  Well nourished, well developed in no acute distress HEENT: Normal NECK: No JVD; No carotid bruits LYMPHATICS: No lymphadenopathy CARDIAC: RRR, +diastolic murmur  at the left upper and lower sternal border, rubs, gallops RESPIRATORY:  Clear to auscultation without rales, wheezing or rhonchi  ABDOMEN: Soft, non-tender, non-distended MUSCULOSKELETAL:  No edema; No deformity  SKIN: Warm and dry NEUROLOGIC:  Alert and oriented x 3 PSYCHIATRIC:  Normal affect   ASSESSMENT:    Blurry Vision/Presyncope: No signs of carotid dx. No hx of arrhythmia. EKG is normal today. Will obtain an echo to assess for a possible embolic source   PLAN:    In order of problems listed above:  TTE Follow up pending results           Medication Adjustments/Labs and Tests Ordered: Current medicines are reviewed at length with the patient today.  Concerns regarding medicines are outlined above.  Orders Placed This Encounter  Procedures   EKG 12-Lead   ECHOCARDIOGRAM COMPLETE   No orders of the defined types were placed in this encounter.   Patient Instructions  Medication Instructions:  The current medical regimen is effective;  continue present plan and medications.  *If you need a refill on your cardiac medications before your next appointment, please call your pharmacy*   Testing/Procedures: Echocardiogram - Your physician has requested that you have an echocardiogram. Echocardiography is a painless test that uses sound waves to create images of your heart. It provides your doctor with information about the size and shape of your heart and how well your heart's chambers and valves are working. This procedure takes approximately one hour. There are no restrictions for this procedure.     Follow-Up: At [facility], you and your health needs are our priority.  As part of our  continuing mission to provide you with exceptional heart care, we have created designated Provider Care Teams.  These Care Teams include your primary Cardiologist (physician) and Advanced Practice Providers (APPs -  Physician Assistants and Nurse Practitioners) who all work together to provide you with the care you need, when you need it.  We recommend signing up for the patient portal called "MyChart".  Sign up information is provided on this After Visit Summary.  MyChart is used to connect with patients for Virtual Visits (Telemedicine).  Patients are able to view lab/test results, encounter notes, upcoming appointments, etc.  Non-urgent messages can be sent to your provider as well.   To learn more about what you can do with MyChart, go to [email or web address].    Your next appointment:   As needed  The format for your next appointment:   In Person  Provider:   Carolan Clines, MD         Signed, Maisie Fus, MD  06/14/2022 3:22 PM    [facility]

## 2022-06-15 ENCOUNTER — Ambulatory Visit: Payer: BC Managed Care – PPO | Admitting: Neurology

## 2022-06-15 ENCOUNTER — Encounter: Payer: Self-pay | Admitting: Neurology

## 2022-06-15 VITALS — BP 146/97 | HR 79 | Ht 65.0 in | Wt 177.5 lb

## 2022-06-15 DIAGNOSIS — H539 Unspecified visual disturbance: Secondary | ICD-10-CM

## 2022-06-15 DIAGNOSIS — I10 Essential (primary) hypertension: Secondary | ICD-10-CM

## 2022-06-15 NOTE — Progress Notes (Signed)
[facility]  PATIENT: Paula Kelley DOB: [birth year]  REFERRING DOCTOR OR PCP: Abbe Amsterdam, MD SOURCE: Patient, notes from emergency room imaging and lab reports, MRI and CT images personally reviewed  _________________________________   HISTORICAL  CHIEF COMPLAINT:  Chief Complaint  Patient presents with   New Patient (Initial Visit)    RM 2, alone. Referral for syncope, blurred vision, urinary incontinence. Wears glasses. No fainting spells. She reports vision gets blurry vision. Lasts 2 in and clears on its own. Last episode last Wed and then 2 months prior. Had MRI/CT and looks ok. EKG done at cardiology yesterday. Goes on 06/23/22 for further heart testing.    HISTORY OF PRESENT ILLNESS:  I had the pleasure of seeing patient, Paula Kelley, at [facility] for neurologic consultation regarding her 2 episodes of blurry vision  She is a 71 year old woman who had 2 episodes of blurrry vision x 1-2 minutes.  She denied difficulty with expressive or receptive aphasia.   She had no weakness or numbness.  She denies lightheadedness or  syncope.   She was checked both times for hypoglycemia but was fine.   She was noted to have diastolic BP around 60.     She went to the ED 04/09/2022.   She had  brian MRI and CTA of the head/neck.    There was no significant arterial stenosis on CTA.  MRI was normal for age.    She had a similar episode with blurry vision earlier this year, also just lasting 1-2 minutes with blurry vision.        She is active and works full time in [employer].    She walks [phone] steps a day.     She takes aspirin 81 mg daily since age 71.    She has HTN and is otherwise healthy.   She has had bilateral TKR with quick recoveries.      IMAGING 04/09/2022: CT head:  1. No evidence of acute intracranial abnormality.  2. Mild paranasal sinus disease, as described.   CTA neck:  1. The common carotid, internal carotid and vertebral  arteries are  patent within the neck without stenosis or significant  atherosclerotic disease.  2.  Aortic Atherosclerosis (ICD10-I70.0).  3. Cervical spondylosis.   CTA head:  1. No intracranial large vessel occlusion or proximal high-grade  arterial stenosis.  2. Minimal non-stenotic calcified plaque within the intracranial  internal carotid arteries.   MRI of the brain was normal for age.      REVIEW OF SYSTEMS: Constitutional: No fevers, chills, sweats, or change in appetite Eyes: No visual changes, double vision, eye pain Ear, nose and throat: No hearing loss, ear pain, nasal congestion, sore throat Cardiovascular: No chest pain, palpitations Respiratory:  No shortness of breath at rest or with exertion.   No wheezes GastrointestinaI: No nausea, vomiting, diarrhea, abdominal pain, fecal incontinence Genitourinary:  No dysuria, urinary retention or frequency.  No nocturia. Musculoskeletal:  No neck pain, back pain Integumentary: No rash, pruritus, skin lesions Neurological: as above Psychiatric: No depression at this time.  No anxiety Endocrine: No palpitations, diaphoresis, change in appetite, change in weigh or increased thirst Hematologic/Lymphatic:  No anemia, purpura, petechiae. Allergic/Immunologic: No itchy/runny eyes, nasal congestion, recent allergic reactions, rashes  ALLERGIES: Allergies  Allergen Reactions   Sulfa Antibiotics    Sulfonamide Derivatives     REACTION: unsure of reaction---this was as a child    HOME MEDICATIONS:  Current Outpatient  Medications:    amLODipine-benazepril (LOTREL) 5-10 MG capsule, Take 1 capsule by mouth daily., Disp: 90 capsule, Rfl: 1   Ascorbic Acid (VITAMIN C) 100 MG CHEW, Vitamin C, Disp: , Rfl:    aspirin 81 MG chewable tablet, Aspir-81, Disp: , Rfl:    Calcium 500-100 MG-UNIT CHEW, Calcium 500, Disp: , Rfl:    diclofenac (VOLTAREN) 75 MG EC tablet, Take 1 tablet (75 mg total) by mouth 2 (two) times daily., Disp: 50  tablet, Rfl: 2   ferrous sulfate 325 (65 FE) MG tablet, Take 325 mg by mouth daily with breakfast., Disp: , Rfl:    fluticasone (FLONASE) 50 MCG/ACT nasal spray, Place 1 spray into both nostrils daily., Disp: 18.2 g, Rfl: 11   levocetirizine (XYZAL ALLERGY 24HR) 5 MG tablet, Take 1 tablet (5 mg total) by mouth every evening., Disp: 90 tablet, Rfl: 3   Omega-3 Fatty Acids (FISH OIL) 1000 MG CAPS, Fish Oil, Disp: , Rfl:    Turmeric (QC TUMERIC COMPLEX PO), Take 2 tablets by mouth daily., Disp: , Rfl:   PAST MEDICAL HISTORY: Past Medical History:  Diagnosis Date   Allergic rhinitis    Anxiety    DJD (degenerative joint disease)    Sees Dr. Haze Boyden)    Hypertension    Mild anemia    iron def, reports on iron her whole life   Obesity    URI (upper respiratory infection)    UTI (lower urinary tract infection)     PAST SURGICAL HISTORY: Past Surgical History:  Procedure Laterality Date   COLONOSCOPY     svd     2 live births   TOTAL KNEE ARTHROPLASTY Left 11/01/2016   Procedure: LEFT TOTAL KNEE ARTHROPLASTY;  Surgeon: Ollen Gross, MD;  Location: [facility];  Service: Orthopedics;  Laterality: Left;  Adductor Block   TOTAL KNEE ARTHROPLASTY Right 10/31/2017   Procedure: RIGHT TOTAL KNEE ARTHROPLASTY;  Surgeon: Ollen Gross, MD;  Location: [facility];  Service: Orthopedics;  Laterality: Right;  50 mins    FAMILY HISTORY: Family History  Problem Relation Age of Onset   Heart disease Father    Heart disease Mother    Colon cancer Neg Hx     SOCIAL HISTORY:  Social History   Socioeconomic History   Marital status: Married    Spouse name: Not on file   Number of children: Not on file   Years of education: Not on file   Highest education level: Bachelor's degree (e.g., BA, AB, BS)  Occupational History   Occupation: retired  Tobacco Use   Smoking status: Never   Smokeless tobacco: Never  Vaping Use   Vaping Use: Never used  Substance and Sexual Activity    Alcohol use: Yes    Comment: social use   Drug use: No   Sexual activity: Not on file  Other Topics Concern   Not on file  Social History Narrative   Work or School: works at [employer] and T at [employer] Situation: lives with husbandSpiritual Beliefs: methodistLifestyle: walks; trying to eat a healthy diet   Right handed   No caffeine use    Social Determinants of Health   Financial Resource Strain: Low Risk  (05/11/2022)   Overall Financial Resource Strain (CARDIA)    Difficulty of Paying Living Expenses: Not hard at all  Food Insecurity: Unknown (05/11/2022)   Hunger Vital Sign    Worried About Running Out of Food in the Last Year: Never true  Ran Out of Food in the Last Year: Patient refused  Transportation Needs: No Transportation Needs (05/11/2022)   PRAPARE - Administrator, Civil Service (Medical): No    Lack of Transportation (Non-Medical): No  Physical Activity: Sufficiently Active (05/11/2022)   Exercise Vital Sign    Days of Exercise per Week: 5 days    Minutes of Exercise per Session: 30 min  Stress: Unknown (05/11/2022)   [employer] of Occupational Health - Occupational Stress Questionnaire    Feeling of Stress : Patient refused  Social Connections: Socially Integrated (05/11/2022)   Social Connection and Isolation Panel [NHANES]    Frequency of Communication with Friends and Family: More than three times a week    Frequency of Social Gatherings with Friends and Family: Patient refused    Attends Religious Services: More than 4 times per year    Active Member of [employer] or Organizations: Yes    Attends Banker Meetings: 1 to 4 times per year    Marital Status: Married  Catering manager Violence: Not on file     PHYSICAL EXAM  Vitals:   06/15/22 1508  BP: (!) 146/97  Pulse: 79  Weight: 177 lb 8 oz (80.5 kg)  Height: 5\' 5"  (1.651 m)    Body mass index is 29.54 kg/m.  Vision Screening   Right eye Left eye Both eyes  Without  correction 20/100 20/50 20/40  With correction       General: The patient is well-developed and well-nourished and in no acute distress  HEENT:  Head is [place]/AT.  Sclera are anicteric.  Funduscopic exam shows normal optic discs and retinal vessels.  Neck: No carotid bruits are noted.  The neck is nontender.  Cardiovascular: The heart has a regular rate and rhythm with a normal S1 and S2. There were no murmurs, gallops or rubs.    Skin: Extremities are without rash or  edema.  Musculoskeletal:  Back is nontender  Neurologic Exam  Mental status: The patient is alert and oriented x 3 at the time of the examination. The patient has apparent normal recent and remote memory, with an apparently normal attention span and concentration ability.   Speech is normal.  Cranial nerves: Extraocular movements are full. Pupils are equal, round, and reactive to light and accomodation.  Visual fields are full.  Facial symmetry is present. There is good facial sensation to soft touch bilaterally.Facial strength is normal.  Trapezius and sternocleidomastoid strength is normal. No dysarthria is noted.  The tongue is midline, and the patient has symmetric elevation of the soft palate. No obvious hearing deficits are noted.  Motor:  Muscle bulk is normal.   Tone is normal. Strength is  5 / 5 in all 4 extremities.   Sensory: Sensory testing is intact to pinprick, soft touch and vibration sensation in all 4 extremities.  Coordination: Cerebellar testing reveals good finger-nose-finger and heel-to-shin bilaterally.  Gait and station: Station is normal.   Gait is normal. Tandem gait is normal. Romberg is negative.   Reflexes: Deep tendon reflexes are symmetric and normal bilaterally.   Plantar responses are flexor.    DIAGNOSTIC DATA (LABS, IMAGING, TESTING) - I reviewed patient records, labs, notes, testing and imaging myself where available.  Lab Results  Component Value Date   WBC 5.9 06/07/2022   HGB  12.2 06/07/2022   HCT 37.7 06/07/2022   MCV 89.3 06/07/2022   PLT 269.0 06/07/2022      Component Value Date/Time  NA 140 06/07/2022 0848   K 4.3 06/07/2022 0848   CL 103 06/07/2022 0848   CO2 29 06/07/2022 0848   GLUCOSE 97 06/07/2022 0848   BUN 16 06/07/2022 0848   CREATININE 0.82 06/07/2022 0848   CREATININE 0.76 06/04/2020 0900   CALCIUM 9.8 06/07/2022 0848   PROT 7.2 06/04/2020 0900   ALBUMIN 4.2 10/27/2017 1430   AST 17 06/04/2020 0900   ALT 9 06/04/2020 0900   ALKPHOS 75 10/27/2017 1430   BILITOT 0.5 06/04/2020 0900   GFRNONAA >60 04/09/2022 0939   GFRNONAA 81 06/04/2020 0900   GFRAA 93 06/04/2020 0900   Lab Results  Component Value Date   CHOL 177 06/07/2022   HDL 66.00 06/07/2022   LDLCALC 100 (H) 06/07/2022   TRIG 55.0 06/07/2022   CHOLHDL 3 06/07/2022   Lab Results  Component Value Date   HGBA1C 5.4 06/07/2022   No results found for: "VITAMINB12" Lab Results  Component Value Date   TSH 1.43 06/07/2022       ASSESSMENT AND PLAN  Vision disturbance  Essential hypertension   In summary, Ms. Guilbert is a 71 year old woman who had 2 episodes of transient visual disturbance and a strange sensation lasting about 1 to 2 minutes.  She had a thorough evaluation in the emergency room in June 2023 with a second episode including CT angiogram of the neck and intracranial arteries and an MRI of the brain.  I personally reviewed these images and they are normal for age.  He reportedly had an EKG showing PVCs and is scheduled to have an echocardiogram with cardiology.  The 2 episodes do not all sound like seizures so I do not think we need to check an EEG at this time.  However, I told her that if she has additional spells I would order an EEG and I would recommend that a 48-hour Holter or other heart monitor to assess for possibility of arrhythmia.  I will see her back if she has new or worsening symptoms.  Thank you for asking me to see Ms. Manheim.  Please let me  know if I can be of further assistance with her or other patients in the future.   Karli Wickizer A. Epimenio Foot, MD, [facility] 06/15/2022, 5:20 PM Certified in Neurology, Clinical Neurophysiology, Sleep Medicine and Neuroimaging  [facility] [address], [address] [place], [place] [address] [phone]

## 2022-06-23 ENCOUNTER — Ambulatory Visit (HOSPITAL_COMMUNITY): Payer: BC Managed Care – PPO | Attending: Internal Medicine

## 2022-06-23 DIAGNOSIS — R011 Cardiac murmur, unspecified: Secondary | ICD-10-CM | POA: Insufficient documentation

## 2022-06-23 LAB — ECHOCARDIOGRAM COMPLETE
Area-P 1/2: 3.74 cm2
MV M vel: 6.18 m/s
MV Peak grad: 152.5 mmHg
Radius: 0.7 cm
S' Lateral: 2.7 cm

## 2022-08-04 ENCOUNTER — Other Ambulatory Visit: Payer: Self-pay | Admitting: Family Medicine

## 2022-08-04 DIAGNOSIS — J309 Allergic rhinitis, unspecified: Secondary | ICD-10-CM

## 2022-08-25 ENCOUNTER — Ambulatory Visit: Payer: BC Managed Care – PPO | Admitting: Family Medicine

## 2022-09-30 ENCOUNTER — Ambulatory Visit (INDEPENDENT_AMBULATORY_CARE_PROVIDER_SITE_OTHER)
Admission: RE | Admit: 2022-09-30 | Discharge: 2022-09-30 | Disposition: A | Discharge status: Home / Self Care | Payer: BC Managed Care – PPO | Source: Ambulatory Visit | Attending: Family Medicine | Admitting: Family Medicine

## 2022-09-30 DIAGNOSIS — Z780 Asymptomatic menopausal state: Secondary | ICD-10-CM

## 2022-10-01 DIAGNOSIS — Z780 Asymptomatic menopausal state: Secondary | ICD-10-CM | POA: Diagnosis not present

## 2022-10-07 ENCOUNTER — Other Ambulatory Visit: Payer: BC Managed Care – PPO

## 2022-11-27 ENCOUNTER — Other Ambulatory Visit: Payer: Self-pay | Admitting: Family Medicine

## 2022-11-27 DIAGNOSIS — I10 Essential (primary) hypertension: Secondary | ICD-10-CM

## 2022-12-13 ENCOUNTER — Ambulatory Visit: Payer: BC Managed Care – PPO | Admitting: Family Medicine

## 2022-12-16 ENCOUNTER — Encounter: Payer: Self-pay | Admitting: Family Medicine

## 2022-12-16 ENCOUNTER — Ambulatory Visit: Payer: BC Managed Care – PPO | Admitting: Family Medicine

## 2022-12-16 VITALS — BP 140/86 | HR 81 | Temp 98.3°F | Ht 65.0 in | Wt 180.2 lb

## 2022-12-16 DIAGNOSIS — I10 Essential (primary) hypertension: Secondary | ICD-10-CM

## 2022-12-16 NOTE — Progress Notes (Signed)
   Established Patient Office Visit   Subjective  Patient ID: Paula Kelley, female    DOB: 08-08-1951  Age: 72 y.o. MRN: [id number]  Chief Complaint  Patient presents with   Medical Management of Chronic Issues    Pt is a 72 yo female with pmh sig for  HTN, HLD, aortic atherosclerosis, OA s/p TKR who presents for f/u on chronic issues.  Pt states she has been doing well.  Still working and getting in plenty of steps while there.  Checking bp at home, 90-130s/70s-80s.  Pt denies HA, CP, dizziness, or syncope.      ROS Negative unless stated above    Objective:     BP (!) 140/86 (BP Location: Left Arm, Patient Position: Sitting, Cuff Size: Normal)   Pulse 81   Temp 98.3 F (36.8 C) (Oral)   Ht 5\' 5"  (1.651 m)   Wt 180 lb 3.2 oz (81.7 kg)   SpO2 99%   BMI 29.99 kg/m    Physical Exam Constitutional:      General: She is not in acute distress.    Appearance: Normal appearance.  HENT:     Head: Normocephalic and atraumatic.     Nose: Nose normal.     Mouth/Throat:     Mouth: Mucous membranes are moist.  Cardiovascular:     Rate and Rhythm: Normal rate and regular rhythm.     Heart sounds: Normal heart sounds. No murmur heard.    No gallop.  Pulmonary:     Effort: Pulmonary effort is normal. No respiratory distress.     Breath sounds: Normal breath sounds. No wheezing, rhonchi or rales.  Skin:    General: Skin is warm and dry.  Neurological:     Mental Status: She is alert and oriented to person, place, and time.      No results found for any visits on 12/16/22.    Assessment & Plan:  Essential hypertension -elevated -recheck -continue amlodipine- 5-10 mg -continue monitoring bp at home.  For elevations consistently >140/90 adjust medication.   Return in about 6 months (around 06/16/2023). For CPE  Billie Ruddy, MD

## 2022-12-24 ENCOUNTER — Other Ambulatory Visit: Payer: Self-pay | Admitting: Family Medicine

## 2022-12-24 DIAGNOSIS — J309 Allergic rhinitis, unspecified: Secondary | ICD-10-CM

## 2022-12-25 ENCOUNTER — Other Ambulatory Visit: Payer: Self-pay | Admitting: Family Medicine

## 2022-12-25 DIAGNOSIS — J309 Allergic rhinitis, unspecified: Secondary | ICD-10-CM

## 2023-06-01 ENCOUNTER — Other Ambulatory Visit: Payer: Self-pay | Admitting: Family Medicine

## 2023-06-01 DIAGNOSIS — I10 Essential (primary) hypertension: Secondary | ICD-10-CM

## 2023-06-10 ENCOUNTER — Ambulatory Visit (INDEPENDENT_AMBULATORY_CARE_PROVIDER_SITE_OTHER): Payer: BC Managed Care – PPO | Admitting: Family Medicine

## 2023-06-10 ENCOUNTER — Encounter: Payer: Self-pay | Admitting: Family Medicine

## 2023-06-10 VITALS — BP 132/76 | HR 91 | Temp 97.4°F | Ht 64.5 in | Wt 173.0 lb

## 2023-06-10 DIAGNOSIS — I10 Essential (primary) hypertension: Secondary | ICD-10-CM | POA: Diagnosis not present

## 2023-06-10 DIAGNOSIS — Z0000 Encounter for general adult medical examination without abnormal findings: Secondary | ICD-10-CM

## 2023-06-10 DIAGNOSIS — J309 Allergic rhinitis, unspecified: Secondary | ICD-10-CM | POA: Diagnosis not present

## 2023-06-10 DIAGNOSIS — E782 Mixed hyperlipidemia: Secondary | ICD-10-CM

## 2023-06-10 LAB — COMPREHENSIVE METABOLIC PANEL
ALT: 14 U/L (ref 0–35)
AST: 21 U/L (ref 0–37)
Albumin: 4.3 g/dL (ref 3.5–5.2)
Alkaline Phosphatase: 69 U/L (ref 39–117)
BUN: 11 mg/dL (ref 6–23)
CO2: 30 mEq/L (ref 19–32)
Calcium: 9.7 mg/dL (ref 8.4–10.5)
Chloride: 103 mEq/L (ref 96–112)
Creatinine, Ser: 0.83 mg/dL (ref 0.40–1.20)
GFR: 70.72 mL/min (ref >60.00)
Glucose, Bld: 95 mg/dL (ref 70–99)
Potassium: 4 mEq/L (ref 3.5–5.1)
Sodium: 140 mEq/L (ref 135–145)
Total Bilirubin: 0.5 mg/dL (ref 0.2–1.2)
Total Protein: 7.7 g/dL (ref 6.0–8.3)

## 2023-06-10 LAB — CBC WITH DIFFERENTIAL/PLATELET
Basophils Absolute: 0 10*3/uL (ref 0.0–0.1)
Basophils Relative: 0.1 % (ref 0.0–3.0)
Eosinophils Absolute: 0 10*3/uL (ref 0.0–0.7)
Eosinophils Relative: 0 % (ref 0.0–5.0)
HCT: 37.2 % (ref 36.0–46.0)
Hemoglobin: 12.1 g/dL (ref 12.0–15.0)
Lymphocytes Relative: 29.8 % (ref 12.0–46.0)
Lymphs Abs: 1.7 10*3/uL (ref 0.7–4.0)
MCHC: 32.5 g/dL (ref 30.0–36.0)
MCV: 89.8 fl (ref 78.0–100.0)
Monocytes Absolute: 0.4 10*3/uL (ref 0.1–1.0)
Monocytes Relative: 6.9 % (ref 3.0–12.0)
Neutro Abs: 3.5 10*3/uL (ref 1.4–7.7)
Neutrophils Relative %: 63.2 % (ref 43.0–77.0)
Platelets: 300 10*3/uL (ref 150.0–400.0)
RBC: 4.14 Mil/uL (ref 3.87–5.11)
RDW: 13.8 % (ref 11.5–15.5)
WBC: 5.6 10*3/uL (ref 4.0–10.5)

## 2023-06-10 LAB — LIPID PANEL
Cholesterol: 189 mg/dL (ref 0–200)
HDL: 65.5 mg/dL (ref >39.00)
LDL Cholesterol: 112 mg/dL — ABNORMAL HIGH (ref 0–99)
NonHDL: 123.45
Total CHOL/HDL Ratio: 3
Triglycerides: 56 mg/dL (ref 0.0–149.0)
VLDL: 11.2 mg/dL (ref 0.0–40.0)

## 2023-06-10 LAB — HEMOGLOBIN A1C: Hgb A1c MFr Bld: 5.2 % (ref 4.6–6.5)

## 2023-06-10 LAB — T4, FREE: Free T4: 0.91 ng/dL (ref 0.60–1.60)

## 2023-06-10 LAB — TSH: TSH: 1.04 u[IU]/mL (ref 0.35–5.50)

## 2023-06-10 MED ORDER — FLUTICASONE PROPIONATE 50 MCG/ACT NA SUSP
1.0000 | Freq: Every day | NASAL | 5 refills | Status: DC
Start: 2023-06-10 — End: 2024-06-11

## 2023-06-10 MED ORDER — LEVOCETIRIZINE DIHYDROCHLORIDE 5 MG PO TABS
5.0000 mg | ORAL_TABLET | Freq: Every evening | ORAL | 3 refills | Status: DC
Start: 2023-06-10 — End: 2023-06-24

## 2023-06-10 MED ORDER — AMLODIPINE BESY-BENAZEPRIL HCL 5-10 MG PO CAPS
1.0000 | ORAL_CAPSULE | Freq: Every day | ORAL | 3 refills | Status: DC
Start: 2023-06-10 — End: 2024-06-11

## 2023-06-10 NOTE — Progress Notes (Signed)
Established Patient Office Visit   Subjective  Patient ID: Paula Kelley, female    DOB: [birth year]  Age: 72 y.o. MRN: [id number]  Chief Complaint  Patient presents with   Annual Exam    Pt is a 72 yo female seen for CPE. Pt states she has been walking more each day, nearly 10,000 steps.  The increased walking occasionally makes knees hurt d/t h/o arthritis and b/l TKR. Pt happy to hear she lost wt.  Now 173 lbs, typically in the 180s.  Cut out sodas.  If drinks a soda it may be a Sprite or she will share with her husband.  Drinking more water.  Has two 40 oz bottles in the evenings but wakes up a few times at night to urinate.  Pt states sometimes she sleeps well and other times she does not.  BP in am typically 120s-140/mid 80s-90s prior to meds per log.  BP progressively lowers by the afternoon to 100s-120s/70-80.  Pt takes amlodipine-benazepril 5-10 mg around 8 AM.  Pt has not had any other syncopal or near syncopal episodes while at work.  Now tries to eat a little something for breakfast such as oatmeal or grits.    Past Medical History:  Diagnosis Date   Allergic rhinitis    Anxiety    DJD (degenerative joint disease)    Sees Dr. Haze Boyden)    Hypertension    Mild anemia    iron def, reports on iron her whole life   Obesity    URI (upper respiratory infection)    UTI (lower urinary tract infection)    Past Surgical History:  Procedure Laterality Date   COLONOSCOPY     svd     2 live births   TOTAL KNEE ARTHROPLASTY Left 11/01/2016   Procedure: LEFT TOTAL KNEE ARTHROPLASTY;  Surgeon: Ollen Gross, MD;  Location: [facility];  Service: Orthopedics;  Laterality: Left;  Adductor Block   TOTAL KNEE ARTHROPLASTY Right 10/31/2017   Procedure: RIGHT TOTAL KNEE ARTHROPLASTY;  Surgeon: Ollen Gross, MD;  Location: [facility];  Service: Orthopedics;  Laterality: Right;  50 mins   Social History   Tobacco Use   Smoking status: Never   Smokeless tobacco: Never  Vaping  Use   Vaping status: Never Used  Substance Use Topics   Alcohol use: Yes    Comment: social use   Drug use: No   Family History  Problem Relation Age of Onset   Heart disease Father    Heart disease Mother    Colon cancer Neg Hx    Allergies  Allergen Reactions   Sulfa Antibiotics Other (See Comments)   Sulfonamide Derivatives     REACTION: unsure of reaction---this was as a child      ROS Negative unless stated above    Objective:     BP 132/76 (BP Location: Left Arm, Patient Position: Sitting, Cuff Size: Normal)   Pulse 91   Temp (!) 97.4 F (36.3 C) (Oral)   Ht 5' 4.5" (1.638 m)   Wt 173 lb (78.5 kg)   SpO2 99%   BMI 29.24 kg/m    Physical Exam Constitutional:      Appearance: Normal appearance.  HENT:     Head: Normocephalic and atraumatic.     Right Ear: Tympanic membrane, ear canal and external ear normal.     Left Ear: Tympanic membrane, ear canal and external ear normal.     Nose: Nose normal.  Mouth/Throat:     Mouth: Mucous membranes are moist.     Pharynx: No oropharyngeal exudate or posterior oropharyngeal erythema.  Eyes:     General: No scleral icterus.    Extraocular Movements: Extraocular movements intact.     Conjunctiva/sclera: Conjunctivae normal.     Pupils: Pupils are equal, round, and reactive to light.  Neck:     Thyroid: No thyromegaly.  Cardiovascular:     Rate and Rhythm: Normal rate and regular rhythm.     Pulses: Normal pulses.     Heart sounds: Normal heart sounds. No murmur heard.    No friction rub.  Pulmonary:     Effort: Pulmonary effort is normal.     Breath sounds: Normal breath sounds. No wheezing, rhonchi or rales.  Abdominal:     General: Bowel sounds are normal.     Palpations: Abdomen is soft.     Tenderness: There is no abdominal tenderness.  Musculoskeletal:        General: No deformity. Normal range of motion.  Lymphadenopathy:     Cervical: No cervical adenopathy.  Skin:    General: Skin is warm  and dry.     Findings: No lesion.  Neurological:     General: No focal deficit present.     Mental Status: She is alert and oriented to person, place, and time.  Psychiatric:        Mood and Affect: Mood normal.        Thought Content: Thought content normal.      No results found for any visits on 06/10/23.    Assessment & Plan:  Well adult exam -     CBC with Differential/Platelet -     Hemoglobin A1c  Essential hypertension -     Comprehensive metabolic panel -     CBC with Differential/Platelet -     TSH -     T4, free -     amLODIPine Besy-Benazepril HCl; Take 1 capsule by mouth daily.  Dispense: 90 capsule; Refill: 3  Mixed hyperlipidemia -     Comprehensive metabolic panel -     Lipid panel  Allergic rhinitis, unspecified seasonality, unspecified trigger -     CBC with Differential/Platelet -     Fluticasone Propionate; Place 1 spray into both nostrils daily.  Dispense: 16 g; Refill: 5 -     Levocetirizine Dihydrochloride; Take 1 tablet (5 mg total) by mouth every evening.  Dispense: 90 tablet; Refill: 3   Age-appropriate health screenings discussed.  Will obtain labs.  Immunizations reviewed and up-to-date.  BP well-controlled.  Continue amlodipine-benazepril 5-10 mg daily.  Continue lifestyle modifications.  Patient congratulated on weight loss.  Will try drinking the majority of her water earlier in the evening to prevent frequent nighttime urination as better sleep may help with blood pressure readings in the morning.  Return in about 6 months (around 12/11/2023).  Sooner if needed.  Deeann Saint, MD

## 2023-06-24 ENCOUNTER — Other Ambulatory Visit: Payer: Self-pay | Admitting: Family Medicine

## 2023-06-24 DIAGNOSIS — J309 Allergic rhinitis, unspecified: Secondary | ICD-10-CM

## 2023-11-16 IMAGING — MR MR HEAD W/O CM
10 series · 48 of 48 positions shown · non-contrast
Comparison: CT head 04/09/2022

CLINICAL DATA: TIA, blurred vision

EXAM:
MRI HEAD WITHOUT CONTRAST
TECHNIQUE: Multiplanar, multiecho pulse sequences of the brain and surrounding
structures were obtained without intravenous contrast.

[Series 2: DWI · axial · 3.0mm · 1.25mm/px · z∈[-82,+70]mm · 8 of 100 slices shown (1 of 4)]
[im 1/100]
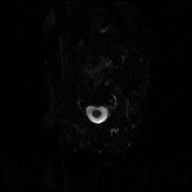
[im 15/100]
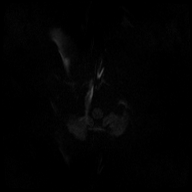
[im 29/100]
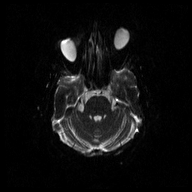
[im 43/100]
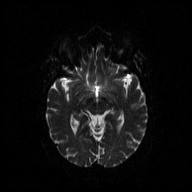
[im 57/100]
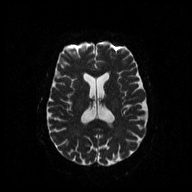
[im 71/100]
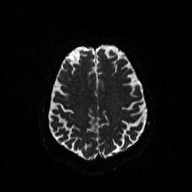
[im 85/100]
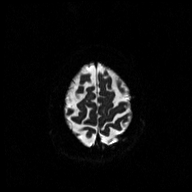
[im 100/100]
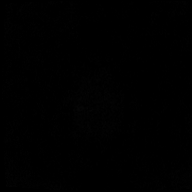

[Series 3: DWI · axial · 3.0mm · 1.25mm/px · z∈[-82,+70]mm · 4 of 51 slices shown (2 of 4)]
[im 1/51]
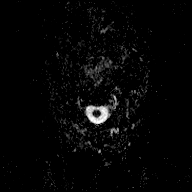
[im 17/51]
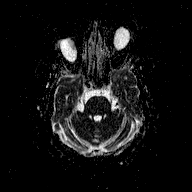
[im 34/51]
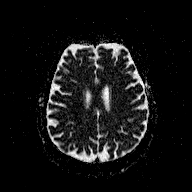
[im 51/51]
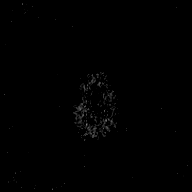

[Series 4: DWI · coronal · 5.0mm · 1.25mm/px · 5 of 64 slices shown (3 of 4)]
[im 1/64]
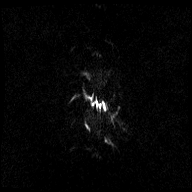
[im 16/64]
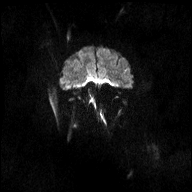
[im 32/64]
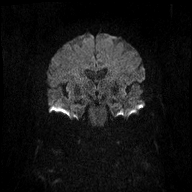
[im 48/64]
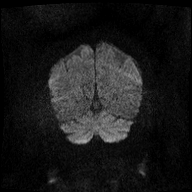
[im 64/64]
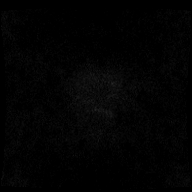

[Series 5: DWI · coronal · 5.0mm · 1.25mm/px · 3 of 32 slices shown (4 of 4)]
[im 1/32]
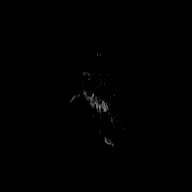
[im 16/32]
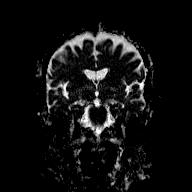
[im 32/32]
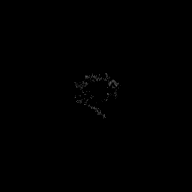

[Series 6: T1 · sagittal · 5.0mm · 0.45mm/px · 2 of 25 slices shown (1 of 2)]
[im 1/25]
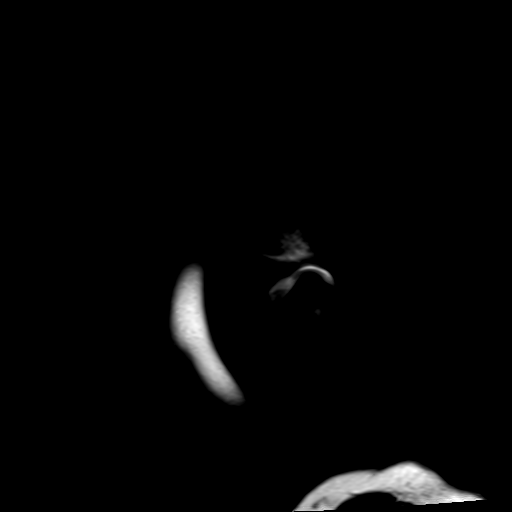
[im 25/25]
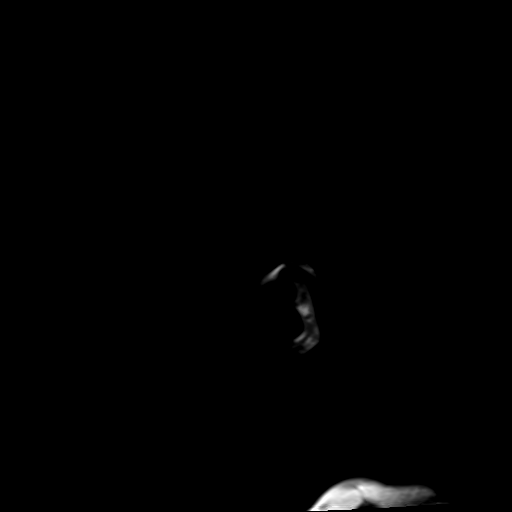

[Series 7: T2 · axial · 5.0mm · 0.72mm/px · z∈[-85,+76]mm · 2 of 24 slices shown (1 of 3)]
[im 1/24]
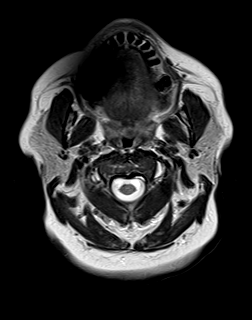
[im 24/24]
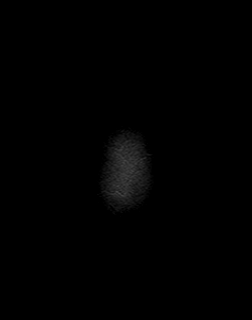

[Series 8: FLAIR · axial · 3.0mm · 0.45mm/px · z∈[-87,+78]mm · 5 of 56 slices shown]
[im 1/56]
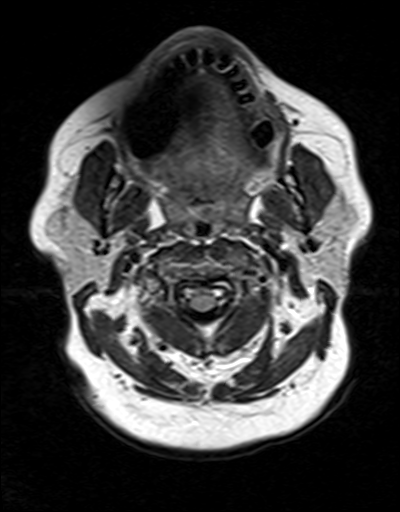
[im 14/56]
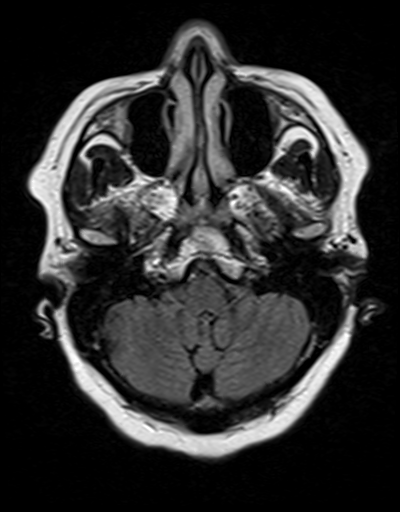
[im 28/56]
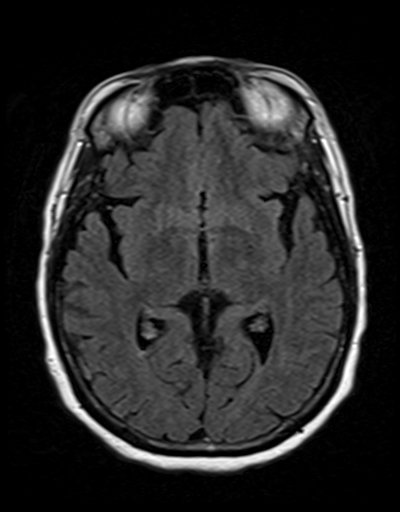
[im 42/56]
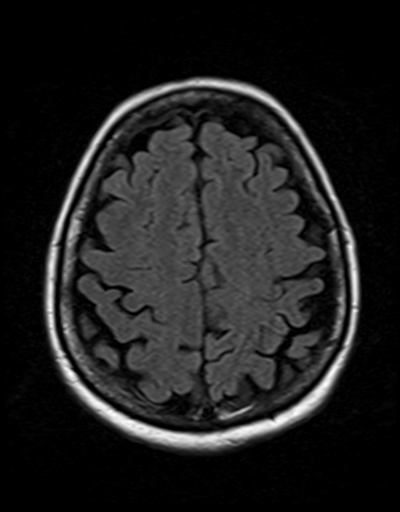
[im 56/56]
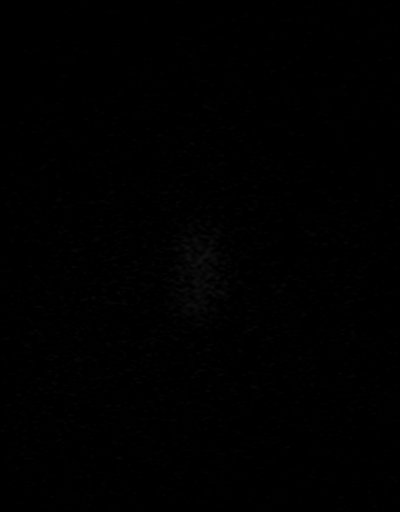

[Series 9: T2 · axial · 5.0mm · 0.72mm/px · z∈[-86,+75]mm · 2 of 24 slices shown (2 of 3)]
[im 1/24]
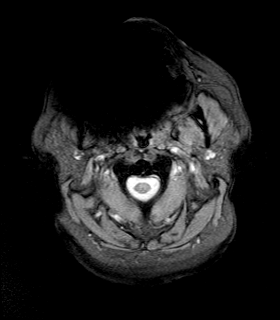
[im 24/24]
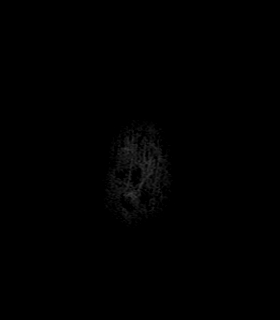

[Series 10: T1 · axial · 1.0mm · 0.94mm/px · z∈[-84,+75]mm · 14 of 160 slices shown (2 of 2)]
[im 1/160]
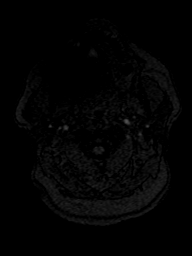
[im 13/160]
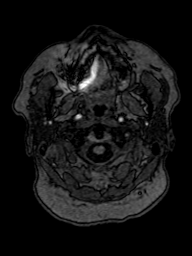
[im 25/160]
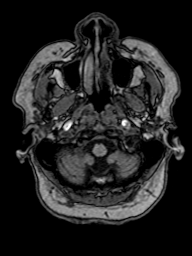
[im 37/160]
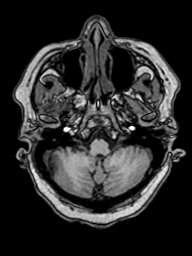
[im 49/160]
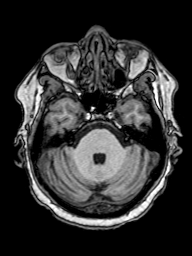
[im 62/160]
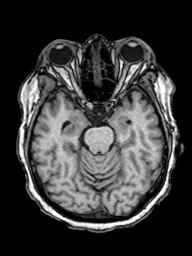
[im 74/160]
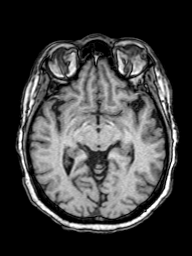
[im 86/160]
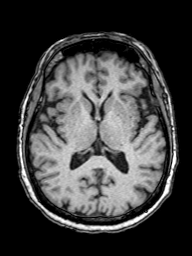
[im 98/160]
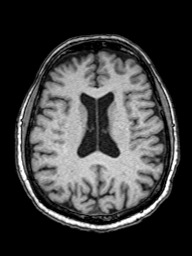
[im 111/160]
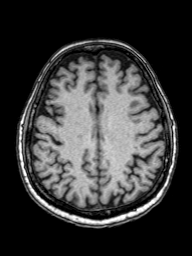
[im 123/160]
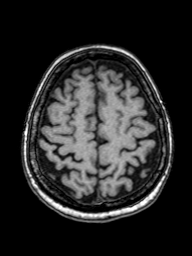
[im 135/160]
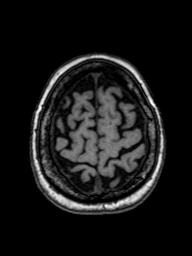
[im 147/160]
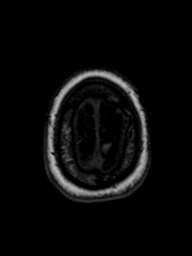
[im 160/160]
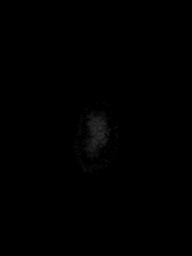

[Series 11: T2 · coronal · 5.0mm · 0.43mm/px · 3 of 31 slices shown (3 of 3)]
[im 1/31]
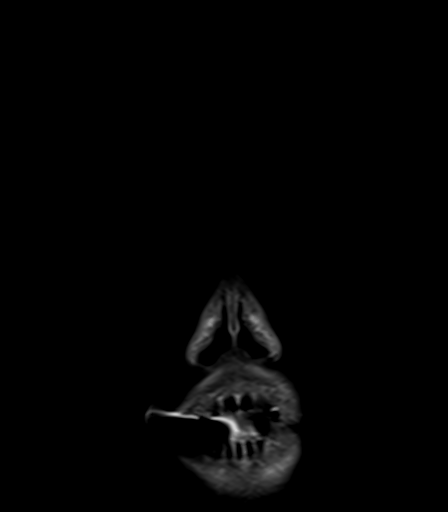
[im 16/31]
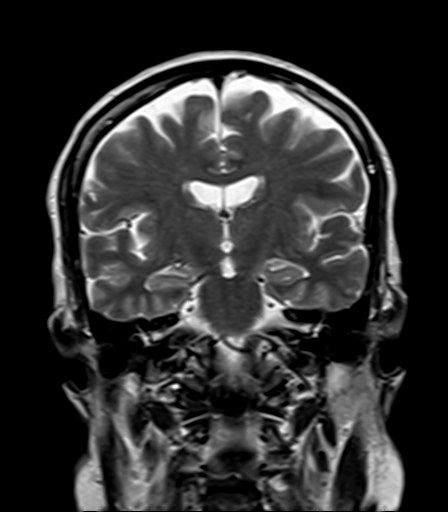
[im 31/31]
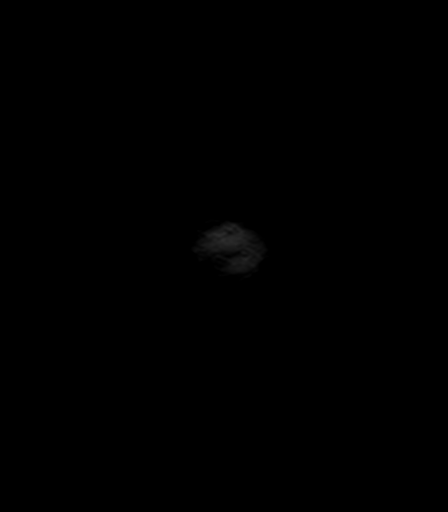

[48 of 48 positions shown; findings below may reference images not displayed]

FINDINGS: Brain: Negative for acute infarct. No significant chronic ischemia.
Negative for hemorrhage, mass, or hydrocephalus. Cerebral volume
normal.

Vascular: Normal arterial flow voids at the skull base

Skull and upper cervical spine: No focal lesion.

Sinuses/Orbits: Mild mucosal edema paranasal sinuses. Negative orbit

Other: None
IMPRESSION: Negative MRI of the head

Mild sinus mucosal disease.

## 2023-12-14 ENCOUNTER — Encounter: Payer: Self-pay | Admitting: Family Medicine

## 2023-12-14 ENCOUNTER — Ambulatory Visit (INDEPENDENT_AMBULATORY_CARE_PROVIDER_SITE_OTHER): Payer: 59 | Admitting: Family Medicine

## 2023-12-14 VITALS — BP 142/86 | HR 103 | Temp 97.6°F | Ht 64.5 in | Wt 168.8 lb

## 2023-12-14 DIAGNOSIS — I10 Essential (primary) hypertension: Secondary | ICD-10-CM

## 2023-12-14 DIAGNOSIS — R634 Abnormal weight loss: Secondary | ICD-10-CM

## 2023-12-14 DIAGNOSIS — Z96653 Presence of artificial knee joint, bilateral: Secondary | ICD-10-CM | POA: Diagnosis not present

## 2023-12-14 DIAGNOSIS — J302 Other seasonal allergic rhinitis: Secondary | ICD-10-CM

## 2023-12-14 NOTE — Progress Notes (Signed)
 Established Patient Office Visit   Subjective  Patient ID: Paula Kelley, female    DOB: July 07, 1951  Age: 73 y.o. MRN: [id number]  Chief Complaint  Patient presents with   Medical Management of Chronic Issues    6 mth follow-up for knee pain, BP, and weight,     Pt is a 73  yo female seen for f/u.  Pt checking bp at home TID.  Readings in the 100s-130s/70-mid 80s.  Patient taking amlodipine-benazepril 5-10 mg daily.  In the past additional BP medication caused hypotension and patient to feel dizzy.  Patient with history of whitecoat hypertension.  Walking at least 10,000 steps per day and drinking at least 80 ounces of water per day.  Patient states she has lost weight.  Wants to get down to 160 lbs.  Patient is having follow-up with Ortho for history of knee replacement.  No longer having pain in knees.  Patient has mammogram and well woman exam tomorrow.  Patient endorses being nervous about this as she does not want them to find anything.  Patient notes improvement in allergy symptoms since taking Xyzal daily and using Flonase as needed.  Will only use Mucinex D if symptoms continue despite Flonase use.    Patient Active Problem List   Diagnosis Date Noted   Vision disturbance 06/15/2022   Mixed hyperlipidemia 06/04/2020   History of total knee replacement, bilateral 06/04/2020   Anemia 11/01/2008   Allergic rhinitis 11/01/2008   Essential hypertension 09/03/2008   OA (osteoarthritis) of knee 09/03/2008   Past Medical History:  Diagnosis Date   Allergic rhinitis    Anxiety    DJD (degenerative joint disease)    Sees Dr. Haze Boyden)    Hypertension    Mild anemia    iron def, reports on iron her whole life   Obesity    URI (upper respiratory infection)    UTI (lower urinary tract infection)    Past Surgical History:  Procedure Laterality Date   COLONOSCOPY     svd     2 live births   TOTAL KNEE ARTHROPLASTY Left 11/01/2016   Procedure: LEFT TOTAL KNEE  ARTHROPLASTY;  Surgeon: Ollen Gross, MD;  Location: [facility];  Service: Orthopedics;  Laterality: Left;  Adductor Block   TOTAL KNEE ARTHROPLASTY Right 10/31/2017   Procedure: RIGHT TOTAL KNEE ARTHROPLASTY;  Surgeon: Ollen Gross, MD;  Location: [facility];  Service: Orthopedics;  Laterality: Right;  50 mins   Social History   Tobacco Use   Smoking status: Never   Smokeless tobacco: Never  Vaping Use   Vaping status: Never Used  Substance Use Topics   Alcohol use: Yes    Comment: social use   Drug use: No   Family History  Problem Relation Age of Onset   Heart disease Father    Heart disease Mother    Colon cancer Neg Hx    Allergies  Allergen Reactions   Sulfa Antibiotics Other (See Comments)   Sulfonamide Derivatives     REACTION: unsure of reaction---this was as a child      ROS Negative unless stated above    Objective:     BP (!) 142/86 (BP Location: Left Arm, Patient Position: Sitting, Cuff Size: Normal)   Pulse (!) 103   Temp 97.6 F (36.4 C) (Oral)   Ht 5' 4.5" (1.638 m)   Wt 168 lb 12.8 oz (76.6 kg)   SpO2 99%   BMI 28.53 kg/m  BP  Readings from Last 3 Encounters:  12/14/23 (!) 142/86  06/10/23 132/76  12/16/22 (!) 140/86   Wt Readings from Last 3 Encounters:  12/14/23 168 lb 12.8 oz (76.6 kg)  06/10/23 173 lb (78.5 kg)  12/16/22 180 lb 3.2 oz (81.7 kg)      Physical Exam Constitutional:      General: She is not in acute distress.    Appearance: Normal appearance.  HENT:     Head: Normocephalic and atraumatic.     Nose: Nose normal.     Mouth/Throat:     Mouth: Mucous membranes are moist.  Cardiovascular:     Rate and Rhythm: Normal rate and regular rhythm.     Heart sounds: Normal heart sounds. No murmur heard.    No gallop.  Pulmonary:     Effort: Pulmonary effort is normal. No respiratory distress.     Breath sounds: Normal breath sounds. No wheezing, rhonchi or rales.  Skin:    General: Skin is warm and dry.  Neurological:      Mental Status: She is alert and oriented to person, place, and time.       12/14/2023    8:50 AM 06/10/2023    8:24 AM 06/07/2022    8:14 AM  Depression screen PHQ 2/9  Decreased Interest 0 0 0  Down, Depressed, Hopeless 0 0 0  PHQ - 2 Score 0 0 0  Altered sleeping 1 0 1  Tired, decreased energy 0 0 0  Change in appetite 0 0 0  Feeling bad or failure about yourself  0 0 0  Trouble concentrating 0 0 0  Moving slowly or fidgety/restless 0 0 0  Suicidal thoughts 0 0 0  PHQ-9 Score 1 0 1  Difficult doing work/chores Not difficult at all Not difficult at all Not difficult at all      12/14/2023    8:51 AM 06/10/2023    8:24 AM 06/05/2021    8:56 AM  GAD 7 : Generalized Anxiety Score  Nervous, Anxious, on Edge 0 0 0  Control/stop worrying 0 0 0  Worry too much - different things 0 0 0  Trouble relaxing 0 0 0  Restless 0  0  Easily annoyed or irritable 0 0 0  Afraid - awful might happen 1 0 1  Total GAD 7 Score 1  1  Anxiety Difficulty Not difficult at all Not difficult at all Not difficult at all    No results found for any visits on 12/14/23.    Assessment & Plan:  Essential hypertension  Weight loss  History of total knee replacement, bilateral  Seasonal allergies  BP elevated in clinic.  Likely 2/2 history of whitecoat hypertension as BP readings normal at home and patient visibly nervous.  Discussed continuing lifestyle modifications.  Continue amlodipine-benazepril 5-10 mg daily.  Continue monitoring BP at home.  Notify clinic for any readings consistently greater than 140/90.  Patient congratulated on weight loss efforts.  Allergies controlled.  Continue Xyzal daily and Flonase as needed  Return in about 6 months (around 06/09/2024) for physical.  Sooner if needed.  Deeann Saint, MD

## 2024-02-07 ENCOUNTER — Telehealth: Payer: Self-pay

## 2024-02-07 NOTE — Telephone Encounter (Signed)
 Copied from CRM [id number]. Topic: Clinical - Medical Advice >> Feb 07, 2024  1:10 PM Trula Gable C wrote: Reason for CRM: Patient called in regarding going to the urgent care for a nose bleed that she has had recently, wanted to see Dr.Banks today but didn't have any available sots, would like for Dr.Banks to give her  a callback    [phone]

## 2024-02-08 NOTE — Telephone Encounter (Signed)
 Spoke with patient, [facility] was [facility], patient states that she will call back to get sch if need be

## 2024-02-13 ENCOUNTER — Ambulatory Visit (INDEPENDENT_AMBULATORY_CARE_PROVIDER_SITE_OTHER): Admitting: Family Medicine

## 2024-02-13 ENCOUNTER — Encounter: Payer: Self-pay | Admitting: Family Medicine

## 2024-02-13 VITALS — BP 138/72 | HR 93 | Temp 98.3°F | Ht 64.5 in | Wt 168.8 lb

## 2024-02-13 DIAGNOSIS — I10 Essential (primary) hypertension: Secondary | ICD-10-CM | POA: Diagnosis not present

## 2024-02-13 DIAGNOSIS — R040 Epistaxis: Secondary | ICD-10-CM | POA: Diagnosis not present

## 2024-02-13 NOTE — Progress Notes (Signed)
 Established Patient Office Visit   Subjective  Patient ID: Paula Kelley, female    DOB: 07-18-1951  Age: 73 y.o. MRN: [id number]  Chief Complaint  Patient presents with   Epistaxis    Nose bleeding started 02/03/24    Patient is a 73 year old female seen for follow-up.  Patient states her nose started bleeding a few weeks ago, 4/18.  She was seen at [facility] for symptoms as she got scared.  Patient was advised to stop using Flonase  and Xyzal  as they may cause symptoms.  Patient notes symptoms have improved but had an intermittent nosebleed since.  Patient was unsure how long she needed to stop using antihistamines.  Blood pressure has been stable.    Patient Active Problem List   Diagnosis Date Noted   Vision disturbance 06/15/2022   Mixed hyperlipidemia 06/04/2020   History of total knee replacement, bilateral 06/04/2020   Anemia 11/01/2008   Allergic rhinitis 11/01/2008   Essential hypertension 09/03/2008   OA (osteoarthritis) of knee 09/03/2008   Past Medical History:  Diagnosis Date   Allergic rhinitis    Anxiety    DJD (degenerative joint disease)    Sees Dr. Serafina Damme)    Hypertension    Mild anemia    iron  def, reports on iron  her whole life   Obesity    URI (upper respiratory infection)    UTI (lower urinary tract infection)    Past Surgical History:  Procedure Laterality Date   COLONOSCOPY     svd     2 live births   TOTAL KNEE ARTHROPLASTY Left 11/01/2016   Procedure: LEFT TOTAL KNEE ARTHROPLASTY;  Surgeon: Liliane Rei, MD;  Location: [facility];  Service: Orthopedics;  Laterality: Left;  Adductor Block   TOTAL KNEE ARTHROPLASTY Right 10/31/2017   Procedure: RIGHT TOTAL KNEE ARTHROPLASTY;  Surgeon: Liliane Rei, MD;  Location: [facility];  Service: Orthopedics;  Laterality: Right;  50 mins   Social History   Tobacco Use   Smoking status: Never   Smokeless tobacco: Never  Vaping Use   Vaping status: Never Used  Substance Use Topics    Alcohol use: Yes    Comment: social use   Drug use: No   Family History  Problem Relation Age of Onset   Heart disease Father    Heart disease Mother    Colon cancer Neg Hx    Allergies  Allergen Reactions   Sulfa Antibiotics Other (See Comments)   Sulfonamide Derivatives     REACTION: unsure of reaction---this was as a child      ROS Negative unless stated above    Objective:     BP 138/72 (BP Location: Left Arm, Patient Position: Sitting, Cuff Size: Normal)   Pulse 93   Temp 98.3 F (36.8 C) (Oral)   Ht 5' 4.5" (1.638 m)   Wt 168 lb 12.8 oz (76.6 kg)   SpO2 97%   BMI 28.53 kg/m  BP Readings from Last 3 Encounters:  02/13/24 138/72  12/14/23 (!) 142/86  06/10/23 132/76   Wt Readings from Last 3 Encounters:  02/13/24 168 lb 12.8 oz (76.6 kg)  12/14/23 168 lb 12.8 oz (76.6 kg)  06/10/23 173 lb (78.5 kg)    Physical Exam Constitutional:      General: She is not in acute distress.    Appearance: Normal appearance.  HENT:     Head: Normocephalic and atraumatic.     Nose: Nose normal. No rhinorrhea.  Right Turbinates: Not pale.     Left Turbinates: Not pale.     Comments: Scant dried blood in L nares.    Mouth/Throat:     Mouth: Mucous membranes are moist.  Cardiovascular:     Rate and Rhythm: Normal rate and regular rhythm.     Heart sounds: Normal heart sounds. No murmur heard.    No gallop.  Pulmonary:     Effort: Pulmonary effort is normal. No respiratory distress.     Breath sounds: Normal breath sounds. No wheezing, rhonchi or rales.  Skin:    General: Skin is warm and dry.  Neurological:     Mental Status: She is alert and oriented to person, place, and time.    No results found for any visits on 02/13/24.    Assessment & Plan:  Epistaxis  Essential hypertension  Active nasal bleeding resolved.  Patient advised to continue holding Flonase .  Okay to restart Xyzal  or other p.o. antihistamine as needed. Can use A[place] nasal saline gel if  needed for dryness.  Continue other supportive care including humidifier if needed.  For continued or worsened symptoms consider ENT referral.    Return if symptoms worsen or fail to improve.   Viola Greulich, MD

## 2024-06-11 ENCOUNTER — Ambulatory Visit (INDEPENDENT_AMBULATORY_CARE_PROVIDER_SITE_OTHER): Admitting: Family Medicine

## 2024-06-11 ENCOUNTER — Encounter: Payer: Self-pay | Admitting: Family Medicine

## 2024-06-11 VITALS — BP 139/82 | HR 82 | Temp 98.0°F | Ht 64.5 in | Wt 167.8 lb

## 2024-06-11 DIAGNOSIS — I10 Essential (primary) hypertension: Secondary | ICD-10-CM

## 2024-06-11 DIAGNOSIS — E782 Mixed hyperlipidemia: Secondary | ICD-10-CM | POA: Diagnosis not present

## 2024-06-11 DIAGNOSIS — J309 Allergic rhinitis, unspecified: Secondary | ICD-10-CM | POA: Diagnosis not present

## 2024-06-11 DIAGNOSIS — Z0000 Encounter for general adult medical examination without abnormal findings: Secondary | ICD-10-CM

## 2024-06-11 LAB — COMPREHENSIVE METABOLIC PANEL WITH GFR
ALT: 11 U/L (ref 0–35)
AST: 19 U/L (ref 0–37)
Albumin: 4.5 g/dL (ref 3.5–5.2)
Alkaline Phosphatase: 71 U/L (ref 39–117)
BUN: 14 mg/dL (ref 6–23)
CO2: 28 meq/L (ref 19–32)
Calcium: 9.5 mg/dL (ref 8.4–10.5)
Chloride: 102 meq/L (ref 96–112)
Creatinine, Ser: 0.7 mg/dL (ref 0.40–1.20)
GFR: 86.15 mL/min (ref >60.00)
Glucose, Bld: 94 mg/dL (ref 70–99)
Potassium: 3.9 meq/L (ref 3.5–5.1)
Sodium: 141 meq/L (ref 135–145)
Total Bilirubin: 0.4 mg/dL (ref 0.2–1.2)
Total Protein: 7.9 g/dL (ref 6.0–8.3)

## 2024-06-11 LAB — CBC WITH DIFFERENTIAL/PLATELET
Basophils Absolute: 0 K/uL (ref 0.0–0.1)
Basophils Relative: 0.1 % (ref 0.0–3.0)
Eosinophils Absolute: 0 K/uL (ref 0.0–0.7)
Eosinophils Relative: 0 % (ref 0.0–5.0)
HCT: 37.4 % (ref 36.0–46.0)
Hemoglobin: 12.5 g/dL (ref 12.0–15.0)
Lymphocytes Relative: 30.8 % (ref 12.0–46.0)
Lymphs Abs: 1.6 K/uL (ref 0.7–4.0)
MCHC: 33.3 g/dL (ref 30.0–36.0)
MCV: 87.6 fl (ref 78.0–100.0)
Monocytes Absolute: 0.3 K/uL (ref 0.1–1.0)
Monocytes Relative: 6.1 % (ref 3.0–12.0)
Neutro Abs: 3.3 K/uL (ref 1.4–7.7)
Neutrophils Relative %: 63 % (ref 43.0–77.0)
Platelets: 253 K/uL (ref 150.0–400.0)
RBC: 4.27 Mil/uL (ref 3.87–5.11)
RDW: 13.9 % (ref 11.5–15.5)
WBC: 5.2 K/uL (ref 4.0–10.5)

## 2024-06-11 LAB — TSH: TSH: 1.17 u[IU]/mL (ref 0.35–5.50)

## 2024-06-11 LAB — T4, FREE: Free T4: 0.79 ng/dL (ref 0.60–1.60)

## 2024-06-11 LAB — LIPID PANEL
Cholesterol: 182 mg/dL (ref 0–200)
HDL: 78 mg/dL (ref >39.00)
LDL Cholesterol: 95 mg/dL (ref 0–99)
NonHDL: 103.55
Total CHOL/HDL Ratio: 2
Triglycerides: 41 mg/dL (ref 0.0–149.0)
VLDL: 8.2 mg/dL (ref 0.0–40.0)

## 2024-06-11 LAB — HEMOGLOBIN A1C: Hgb A1c MFr Bld: 5.4 % (ref 4.6–6.5)

## 2024-06-11 MED ORDER — AMLODIPINE BESY-BENAZEPRIL HCL 5-10 MG PO CAPS: 1.0000 | ORAL_CAPSULE | Freq: Every day | ORAL | 3 refills | Status: AC

## 2024-06-11 MED ORDER — LEVOCETIRIZINE DIHYDROCHLORIDE 5 MG PO TABS: 5.0000 mg | ORAL_TABLET | Freq: Every evening | ORAL | 3 refills | Status: AC

## 2024-06-11 NOTE — Progress Notes (Signed)
 Established Patient Office Visit   Subjective  Patient ID: Paula Kelley, female    DOB: 1951/01/31  Age: 73 y.o. MRN: [id number]  Chief Complaint  Patient presents with   Annual Exam    Pt is a 73 year old female seen for CPE.  Patient doing well overall.  Patient is still working in Advertising copywriter at [employer].  Occasionally checking BP at home.  Latest readings 135/80, 104/71, 114/76, 115/91, 104/81, 106/84.  Patient gets nervous prior to OFV's which makes BP elevated.  States worried something may be found regarding her health.  Patient working on maintaining her weight by drinking 80 ounces of water  per day and not eating so late at night.  Followed by gynecology, Dr. Sarrah.  Had mammogram in February.  Requesting refills on BP medicine and allergy medicine.    Patient Active Problem List   Diagnosis Date Noted   Vision disturbance 06/15/2022   Mixed hyperlipidemia 06/04/2020   History of total knee replacement, bilateral 06/04/2020   Anemia 11/01/2008   Allergic rhinitis 11/01/2008   Essential hypertension 09/03/2008   OA (osteoarthritis) of knee 09/03/2008   Past Medical History:  Diagnosis Date   Allergic rhinitis    Anxiety    DJD (degenerative joint disease)    Sees Dr. Mai Spike)    Hypertension    Mild anemia    iron  def, reports on iron  her whole life   Obesity    URI (upper respiratory infection)    UTI (lower urinary tract infection)    Past Surgical History:  Procedure Laterality Date   COLONOSCOPY     svd     2 live births   TOTAL KNEE ARTHROPLASTY Left 11/01/2016   Procedure: LEFT TOTAL KNEE ARTHROPLASTY;  Surgeon: Dempsey Moan, MD;  Location: [facility];  Service: Orthopedics;  Laterality: Left;  Adductor Block   TOTAL KNEE ARTHROPLASTY Right 10/31/2017   Procedure: RIGHT TOTAL KNEE ARTHROPLASTY;  Surgeon: Moan Dempsey, MD;  Location: [facility];  Service: Orthopedics;  Laterality: Right;  50 mins   Social History   Tobacco Use    Smoking status: Never   Smokeless tobacco: Never  Vaping Use   Vaping status: Never Used  Substance Use Topics   Alcohol use: Yes    Comment: social use   Drug use: No   Family History  Problem Relation Age of Onset   Heart disease Father    Heart disease Mother    Colon cancer Neg Hx    Allergies  Allergen Reactions   Sulfa Antibiotics Other (See Comments)   Sulfonamide Derivatives     REACTION: unsure of reaction---this was as a child    ROS Negative unless stated above    Objective:     BP 139/82 (BP Location: Right Arm, Cuff Size: Normal)   Pulse 82   Temp 98 F (36.7 C) (Oral)   Ht 5' 4.5 (1.638 m)   Wt 167 lb 12.8 oz (76.1 kg)   SpO2 98%   BMI 28.36 kg/m  BP Readings from Last 3 Encounters:  06/11/24 139/82  02/13/24 138/72  12/14/23 (!) 142/86   Wt Readings from Last 3 Encounters:  06/11/24 167 lb 12.8 oz (76.1 kg)  02/13/24 168 lb 12.8 oz (76.6 kg)  12/14/23 168 lb 12.8 oz (76.6 kg)      Physical Exam Constitutional:      Appearance: Normal appearance.  HENT:     Head: Normocephalic and atraumatic.  Right Ear: Tympanic membrane, ear canal and external ear normal.     Left Ear: Tympanic membrane, ear canal and external ear normal.     Nose: Nose normal.     Mouth/Throat:     Mouth: Mucous membranes are moist.     Pharynx: No oropharyngeal exudate or posterior oropharyngeal erythema.  Eyes:     General: No scleral icterus.    Extraocular Movements: Extraocular movements intact.     Conjunctiva/sclera: Conjunctivae normal.     Pupils: Pupils are equal, round, and reactive to light.  Neck:     Thyroid : No thyromegaly.     Vascular: No carotid bruit.  Cardiovascular:     Rate and Rhythm: Normal rate and regular rhythm.     Pulses: Normal pulses.     Heart sounds: Normal heart sounds. No murmur heard.    No friction rub.  Pulmonary:     Effort: Pulmonary effort is normal.     Breath sounds: Normal breath sounds. No wheezing, rhonchi or  rales.  Abdominal:     General: Bowel sounds are normal.     Palpations: Abdomen is soft.     Tenderness: There is no abdominal tenderness.  Musculoskeletal:        General: No deformity. Normal range of motion.  Lymphadenopathy:     Cervical: No cervical adenopathy.  Skin:    General: Skin is warm and dry.     Findings: No lesion.  Neurological:     General: No focal deficit present.     Mental Status: She is alert and oriented to person, place, and time.  Psychiatric:        Mood and Affect: Mood normal.        Thought Content: Thought content normal.        06/11/2024    8:51 AM 12/14/2023    8:50 AM 06/10/2023    8:24 AM  Depression screen PHQ 2/9  Decreased Interest 3 0 0  Down, Depressed, Hopeless 0 0 0  PHQ - 2 Score 3 0 0  Altered sleeping 1 1 0  Tired, decreased energy 0 0 0  Change in appetite 1 0 0  Feeling bad or failure about yourself  0 0 0  Trouble concentrating 0 0 0  Moving slowly or fidgety/restless 0 0 0  Suicidal thoughts 0 0 0  PHQ-9 Score 5 1 0  Difficult doing work/chores  Not difficult at all Not difficult at all      06/11/2024    8:52 AM 12/14/2023    8:51 AM 06/10/2023    8:24 AM 06/05/2021    8:56 AM  GAD 7 : Generalized Anxiety Score  Nervous, Anxious, on Edge 0 0 0 0  Control/stop worrying 0 0 0 0  Worry too much - different things 0 0 0 0  Trouble relaxing 0 0 0 0  Restless 0 0  0  Easily annoyed or irritable 0 0 0 0  Afraid - awful might happen 2 1 0 1  Total GAD 7 Score 2 1  1   Anxiety Difficulty  Not difficult at all Not difficult at all Not difficult at all     No results found for any visits on 06/11/24.    Assessment & Plan:   Well adult exam -     CBC with Differential/Platelet; Future -     Comprehensive metabolic panel with GFR; Future -     Hemoglobin A1c; Future -  Lipid panel; Future -     T4, free; Future -     TSH; Future  Essential hypertension -     Comprehensive metabolic panel with GFR; Future -      T4, free; Future -     TSH; Future -     amLODIPine  Besy-Benazepril  HCl; Take 1 capsule by mouth daily.  Dispense: 90 capsule; Refill: 3  Mixed hyperlipidemia -     Lipid panel; Future  Allergic rhinitis, unspecified seasonality, unspecified trigger -     Levocetirizine Dihydrochloride ; Take 1 tablet (5 mg total) by mouth every evening.  Dispense: 90 tablet; Refill: 3  Age-appropriate health screenings discussed.  Obtain labs.  Immunizations reviewed.  Consider influenza vaccine when available..  Pap and mammogram up-to-date will get information from OB/GYN.  Colonoscopy up-to-date.  BP controlled at home.SABRA  [place]coat hypertension affecting readings in clinic.  Continue current medication regimen including Norvasc -benazepril  5-10 mg daily.  Prior increases in this medication have caused hypotension and syncopal episode.  Continue to monitor.  Continue lifestyle modifications.  Next CPE in 1 year.  Return in about 1 year (around 06/11/2025) for physical.  Sooner if needed.   Clotilda JONELLE Single, MD

## 2024-06-14 ENCOUNTER — Ambulatory Visit: Payer: Self-pay | Admitting: Family Medicine

## 2024-06-21 ENCOUNTER — Encounter: Payer: 59 | Admitting: Family Medicine
